# Patient Record
Sex: Female | Born: 1945 | Race: White | Hispanic: No | Marital: Married | Smoking: Never smoker
Health system: Southern US, Community
[De-identification: ages and names within clinical notes are randomized; demographics above are authoritative.]

## PROBLEM LIST (undated history)

## (undated) DIAGNOSIS — E785 Hyperlipidemia, unspecified: Secondary | ICD-10-CM

## (undated) DIAGNOSIS — I10 Essential (primary) hypertension: Secondary | ICD-10-CM

## (undated) DIAGNOSIS — K589 Irritable bowel syndrome without diarrhea: Secondary | ICD-10-CM

## (undated) DIAGNOSIS — T7840XA Allergy, unspecified, initial encounter: Secondary | ICD-10-CM

## (undated) DIAGNOSIS — R011 Cardiac murmur, unspecified: Secondary | ICD-10-CM

## (undated) HISTORY — DX: Hyperlipidemia, unspecified: E78.5

## (undated) HISTORY — DX: Essential (primary) hypertension: I10

## (undated) HISTORY — DX: Irritable bowel syndrome, unspecified: K58.9

## (undated) HISTORY — DX: Cardiac murmur, unspecified: R01.1

## (undated) HISTORY — PX: KNEE ARTHROSCOPY: SUR90

## (undated) HISTORY — PX: COLONOSCOPY: SHX174

## (undated) HISTORY — PX: ABDOMINAL HYSTERECTOMY: SHX81

## (undated) HISTORY — DX: Allergy, unspecified, initial encounter: T78.40XA

---

## 1983-04-01 HISTORY — PX: BIOPSY BREAST: PRO8

## 2002-09-22 ENCOUNTER — Encounter: Payer: Self-pay | Admitting: Obstetrics & Gynecology

## 2002-09-22 ENCOUNTER — Ambulatory Visit (HOSPITAL_COMMUNITY): Admission: RE | Admit: 2002-09-22 | Discharge: 2002-09-22 | Payer: Self-pay | Admitting: Obstetrics & Gynecology

## 2002-10-04 ENCOUNTER — Encounter: Payer: Self-pay | Admitting: Obstetrics & Gynecology

## 2002-10-04 ENCOUNTER — Ambulatory Visit (HOSPITAL_COMMUNITY): Admission: RE | Admit: 2002-10-04 | Discharge: 2002-10-04 | Payer: Self-pay | Admitting: Obstetrics & Gynecology

## 2003-11-22 ENCOUNTER — Ambulatory Visit (HOSPITAL_COMMUNITY): Admission: RE | Admit: 2003-11-22 | Discharge: 2003-11-22 | Payer: Self-pay | Admitting: Obstetrics & Gynecology

## 2004-01-31 ENCOUNTER — Ambulatory Visit (HOSPITAL_COMMUNITY): Admission: RE | Admit: 2004-01-31 | Discharge: 2004-01-31 | Payer: Self-pay | Admitting: Family Medicine

## 2004-03-18 ENCOUNTER — Ambulatory Visit (HOSPITAL_COMMUNITY): Admission: RE | Admit: 2004-03-18 | Discharge: 2004-03-18 | Payer: Self-pay | Admitting: Internal Medicine

## 2004-03-18 ENCOUNTER — Ambulatory Visit: Payer: Self-pay | Admitting: Internal Medicine

## 2006-03-09 ENCOUNTER — Ambulatory Visit (HOSPITAL_COMMUNITY): Admission: RE | Admit: 2006-03-09 | Discharge: 2006-03-09 | Payer: Self-pay | Admitting: Obstetrics & Gynecology

## 2006-10-05 ENCOUNTER — Inpatient Hospital Stay (HOSPITAL_COMMUNITY): Admission: RE | Admit: 2006-10-05 | Discharge: 2006-10-07 | Payer: Self-pay | Admitting: Obstetrics & Gynecology

## 2006-10-05 ENCOUNTER — Encounter: Payer: Self-pay | Admitting: Obstetrics & Gynecology

## 2007-12-01 ENCOUNTER — Ambulatory Visit: Payer: Self-pay | Admitting: Vascular Surgery

## 2008-06-05 ENCOUNTER — Ambulatory Visit: Payer: Self-pay | Admitting: Cardiology

## 2008-12-05 DIAGNOSIS — E785 Hyperlipidemia, unspecified: Secondary | ICD-10-CM | POA: Insufficient documentation

## 2008-12-05 DIAGNOSIS — R030 Elevated blood-pressure reading, without diagnosis of hypertension: Secondary | ICD-10-CM | POA: Insufficient documentation

## 2008-12-05 DIAGNOSIS — R079 Chest pain, unspecified: Secondary | ICD-10-CM | POA: Insufficient documentation

## 2008-12-05 DIAGNOSIS — J45909 Unspecified asthma, uncomplicated: Secondary | ICD-10-CM | POA: Insufficient documentation

## 2009-11-08 ENCOUNTER — Ambulatory Visit (HOSPITAL_COMMUNITY): Admission: RE | Admit: 2009-11-08 | Discharge: 2009-11-08 | Payer: Self-pay | Admitting: Family Medicine

## 2009-11-14 ENCOUNTER — Ambulatory Visit (HOSPITAL_COMMUNITY): Admission: RE | Admit: 2009-11-14 | Discharge: 2009-11-14 | Payer: Self-pay | Admitting: Family Medicine

## 2010-02-08 ENCOUNTER — Ambulatory Visit (HOSPITAL_COMMUNITY): Admission: RE | Admit: 2010-02-08 | Discharge: 2010-02-08 | Payer: Self-pay | Admitting: Obstetrics & Gynecology

## 2010-08-13 NOTE — Procedures (Signed)
DUPLEX DEEP VENOUS EXAM - LOWER EXTREMITY   INDICATION:  Bilateral lower extremity edema.   HISTORY:  Edema:  Two to 3 month bilateral lower extremity edema right  worse than left.  Trauma/Surgery:  No.  Pain:  Bilateral soreness in the legs.  PE:  No.  Previous DVT:  No.  Anticoagulants:  No.  Other:  No.   DUPLEX EXAM:                CFV   SFV   PopV  PTV    GSV                R  L  R  L  R  L  R   L  R  L  Thrombosis    o  o  o  o  o  o  o   o  o  o  Spontaneous   +  +  +  +  +  +  +   +  +  +  Phasic        +  +  +  +  +  +  +   +  +  +  Augmentation  +  +  +  +  +  +  +   +  +  +  Compressible  +  +  +  +  +  +  +   +  +  +  Competent     +  +  +  +  +  +  +   +  +  +   Legend:  + - yes  o - no  p - partial  D - decreased   IMPRESSION:  1. No evidence of deep vein thrombosis, superficial vein thrombus or      Baker's cyst bilaterally.  2. No evidence of deep venous incompetence bilaterally.    _____________________________  Jessy Oto Fields, MD   MC/MEDQ  D:  12/01/2007  T:  12/01/2007  Job:  [id number]

## 2010-08-13 NOTE — Letter (Signed)
June 05, 2008    Margaretmary Eddy, MD  [facility], [place] [address]   RE:  Tracie Brooks, Tracie Brooks  MRN:  [id number]  /  DOB:  [birth year]   Dear Richardson Landry:   It is my pleasure evaluating Ms. Schanz in the office today in  consultation at your request for hypertension and hyperlipidemia.  Unfortunately, we did not have records from your office prior to the  visit.  It sounds as if she has been generally healthy, but has been  found to have mild-to-moderate hypertension and some hyperlipidemia.  She has not tolerated multiple pharmacologic preparations, most notably  amlodipine due to excessive lower extremity swelling.  She developed leg  discomfort with pravastatin.  She had concerns about  hydrochlorothiazide, furosemide, and benazepril as well without a  compelling story for adverse reactions.  She reports that her mother was  allergic to [place] and was told that none of her children should  ever take those medications; nonetheless, she did tolerate  hydrochlorothiazide for a long period of time.   She was seen by Dr. Verl Blalock approximately 10 years ago for chest discomfort  and an abnormal stress nuclear study.  That abnormality was quite mild  prompting him to recommend continuing observation.  Her symptoms have  resolved.  She does not have any definite manifestations of vascular  disease.   Otherwise, she has been generally healthy.  She has had some upper  respiratory tract symptoms and has been diagnosed with asthma.  She uses  a Ventolin inhaler a few times per day for audible wheezing.  She  underwent TAHBSO for benign disease approximately 2 years ago.   She reports an adverse reaction to amlodipine.  She has no definite drug  allergies.   She currently takes red yeast rice, fish oil, a multivitamin, calcium,  vitamin D, and acidophilus.   SOCIAL HISTORY:  Works as a Designer, multimedia for [employer]; married  with no children; no use of tobacco products or  alcohol.   FAMILY HISTORY:  Positive for conduction system disease in her father  and hypertension in her mother; both are alive and doing fairly well.  She has four siblings, two of whom have hypertension.   Review of systems is generally negative.  She requires corrective  lenses.  She has occasional palpitations and has been told of a heart  murmur.  She has some arthritic discomfort and mild edema in the ankles  and feet.   PHYSICAL EXAMINATION:  GENERAL:  Pleasant overweight woman in no acute  distress.  VITAL SIGNS:  The weight is 219, 5 pounds more than 10 years ago.  Blood  pressure 150/80, heart rate 70 and regular, respirations 12 and  unlabored.  HEENT:  EOMs full; pupils equal, round, and react to light; normal oral  mucosa.  NECK:  No jugular venous distention; normal carotid upstrokes without  bruits.  ENDOCRINE:  No thyromegaly.  HEMATOPOIETIC:  No adenopathy.  LUNGS:  Clear.  CARDIAC:  Normal first and second heart sounds; minimal basilar systolic  ejection murmur.  ABDOMEN:  Soft and nontender; no bruits; no masses; no organomegaly.  EXTREMITIES:  No edema; distal pulses intact.  NEUROLOGIC:  Symmetric strength and tone; normal cranial nerves.   EKG:  Sinus rhythm; borderline left atrial abnormality; slightly delayed  R-wave progression; right ventricular conduction delay; otherwise  normal.   IMPRESSION:  Ms. Claycomb has mild hypertension without clinical  manifestations of vascular disease.  A number of medications cannot be  utilized, notably amlodipine due to her prior adverse reaction, thiazide  diuretic due to her concern about a possible allergic reaction and beta-  blockers due to her history of asthma.  I told her that if she continues  to have multiple episodes of asthma per day, therapy beyond a rescue  inhaler is warranted.  Since she has cold-induced symptoms, it might be  worthwhile to wait for warmer weather to make that decision.   There are  still quite a few options with respect to blood pressure  control.  An ACE inhibitor would be a good first choice.  Due to her  concern about adverse effects, I think it would be preferable to go one  drug at a time.  She will start lisinopril 20 mg daily and assess blood  pressures at home.  I will see her again in the office in 3 weeks.   The only lipid value I have is an LDL of a somewhat more than 150.  Ms.  Cadenas cardiovascular risk is low enough, such that guidelines would not  recommend pharmacologic therapy for this degree of hyperlipidemia.  I  did discuss the benefit of possible weight loss, but she notes that she  has not been successful with this in the past.  We can certainly have  her add Benecol margarine to her current nutraceutical regime and  reassess a lipid profile some weeks in the future.   Thanks much for sending this nice woman to see me.  I will be happy to  manage her cardiovascular risk factors.    Sincerely,      Cristopher Estimable. Lattie Haw, MD, [facility]  Electronically Signed    RMR/MedQ  DD: 06/05/2008  DT: 06/06/2008  Job #: [id number]

## 2010-08-13 NOTE — Discharge Summary (Signed)
Tracie Brooks, Tracie Brooks                   ACCOUNT NO.:  [id number]   MEDICAL RECORD NO.:  [id number]          PATIENT TYPE:  INP   LOCATION:  A312                          FACILITY:  [facility]   PHYSICIAN:  Jonnie Kind, M.D. DATE OF BIRTH:  09/16/1945   DATE OF ADMISSION:  10/05/2006  DATE OF DISCHARGE:  07/09/2008LH                               DISCHARGE SUMMARY   ADMISSION DIAGNOSIS:  Symptomatic fibroid uterus 20-week size.   DISCHARGE DIAGNOSES:  1. Symptomatic fibroid uterus 20-week size.  2. Endometrial polyp.  3. Uterine fibroids (leiomyomata, intramural and subserosal).   PROCEDURE:  Total abdominal hysterectomy, bilateral salpingo-  oophorectomy on October 05, 2006.   DISCHARGE MEDICATIONS:  1. Percocet 5/325 mg, #21, one to two p.o. q.6h. p.r.n. pain (new).  2. Motrin 800 mg, #20 tab, one q.8h. p.r.n. cramps or mild pain (new).  3. Phenergan 25 mg, #12, one p.o. q.6h. p.r.n. pain (new).  4. Continued medications with amlodipine/benazepril 1.5/10 mg, one      p.o. q.d.  5. Hydrochlorothiazide 25 mg, one p.o. q.d.  6. Multivitamin one p.o. q.d.  7. Calcium one daily.   HISTORY:  This 65 year old female, gravida 0, para 0, was admitted for a  four-year history of an enlarged fibroid uterus which was symptomatic.  It filled the pelvis and had a pressure mass effect as described in the  HPI, with the uterus 18-20 week size.  The Pap smear was normal.   HOSPITAL COURSE:  The patient was admitted and underwent a hysterectomy  by Dr. Florian Buff with an uncomplicated operative course.  A  preoperative hemoglobin was 12, hematocrit 38.  Postoperative hemoglobin  was 11 and hematocrit 32.3.  Potassium was 4, BUN 11, creatinine 0.84.   The postoperative course was straightforward with the patient's  pathology report revealing a 500 gram uterus with fibroids up to 8 cm in  diameter on the pathology sample.  There was an endometrial polyp that  was currently asymptomatic.   DISPOSITION:  The patient was discharged home in stable condition after  two days.   FOLLOWUP:  On October 12, 2006, with Dr. Elonda Husky.  Routine post-surgical  instructions were reviewed.      Jonnie Kind, M.D.  Electronically Signed     JVF/MEDQ  D:  10/07/2006  T:  10/07/2006  Job:  [id number]

## 2010-08-13 NOTE — H&P (Signed)
NAME:  Tracie Brooks, Tracie Brooks                   ACCOUNT NO.:  [id number]   MEDICAL RECORD NO.:  [id number]          PATIENT TYPE:  INP   LOCATION:  A312                          FACILITY:  [facility]   PHYSICIAN:  Florian Buff, M.D.   DATE OF BIRTH:  Feb 25, 1946   DATE OF ADMISSION:  10/05/2006  DATE OF DISCHARGE:  LH                              HISTORY & PHYSICAL   HISTORY OF PRESENT ILLNESS:  Barbaraann Share is a 65 year old white female,  gravida 0, para 0, about 3 years postmenopausal, who has a several-year  history, as far as I have seen her about a 4-year history of enlarged  fibroid uterus, which had been minimally to asymptomatic over that  period of time.  However, over the past 6 months or so the patient has  had increasing problems with pain, pressure, urinary frequency and  basically feels lower abdominal discomfort all the time.  Her uterus is  about the same size, about 18-20 weeks' size.  It fills the entire  pelvis and arises to the level of the umbilicus.  There is probably  degeneration as a cause for the pain.  She had an ultrasound a couple of  years ago, which revealed them to be fibroids, not an adnexal mass.  Again, she had periods pretty regular until about 24 or 64 years old.  As a result, she is admitted for abdominal hysterectomy and BSO.   PAST MEDICAL HISTORY:  Significant for hypertension.   PAST SURGICAL HISTORY:  A breast biopsy and a right knee arthroscopy.   MEDICATIONS:  Amlodipine and hydrochlorothiazide.  She also takes some  over-the-counter medications for allergies, dust allergies, etc.   Her only allergy is to SULFA DRUGS.   PAST OBSTETRICAL HISTORY:  She is nulliparous.   Her EKG was normal.  Chest x-ray was normal.   Review of systems otherwise negative.   HEENT is unremarkable.  Thyroid is normal.  Lungs are clear.  Heart is regular rhythm without murmur, rub, or gallop.  Breasts without masses, discharge or skin changes.  Abdomen is benign, no  hepatosplenomegaly or masses.  On pelvic, she has normal external genitalia.  Vagina is pink and moist  without discharge.  Her last Pap smear was normal.  Her uterus is  enlarged, 18-20 weeks' size, arising out of the pelvis to the level of  the umbilicus.  Adnexa are not discernible.  EXTREMITIES:  Warm, no edema.  Neurologic exam is grossly intact.   IMPRESSION:  1. Enlarged fibroid uterus.  2. Increasing pain and symptomatology.   PLAN:  The patient is admitted for abdominal hysterectomy.  She  understands the risks, benefits, indications and alternatives and will  proceed.      Florian Buff, M.D.  Electronically Signed     LHE/MEDQ  D:  10/06/2006  T:  10/06/2006  Job:  [id number]

## 2010-08-13 NOTE — Op Note (Signed)
NAMECHRISTON, Brooks                   ACCOUNT NO.:  [id number]   MEDICAL RECORD NO.:  [id number]          PATIENT TYPE:  INP   LOCATION:  A312                          FACILITY:  [facility]   PHYSICIAN:  Florian Buff, M.D.   DATE OF BIRTH:  January 24, 1946   DATE OF PROCEDURE:  10/05/2006  DATE OF DISCHARGE:                               OPERATIVE REPORT   PREOPERATIVE DIAGNOSES:  1. Enlarged fibroid uterus.  2. Pelvic pain.   POSTOPERATIVE DIAGNOSES:  1. Enlarged fibroid uterus.  2. Pelvic pain.   PROCEDURE:  TAH-BSO.   SURGEON:  Florian Buff, M.D.   ANESTHESIA:  General.   FINDINGS:  The patient had multiple fibroids on her uterus, most  prominently one on her fundus that was pedunculated as well as a large  one that was posterior on the posterior uterine wall.  The ovaries and  tubes were normal.  The cervix appeared to be normal.   DESCRIPTION OF OPERATION:  The patient was taken to the operating room  and placed in the supine position where she underwent general  endotracheal anesthesia.  The vagina was prepped in the usual sterile  fashion.  A Foley catheter was placed.  The abdomen was prepped and  draped in the usual sterile fashion.   A Pfannenstiel skin incision was made and carried down sharply to the  rectus fascia which was scored in midline and extended laterally.  The  fascia was taken off the muscle superiorly and inferiorly without  difficulty.  The muscles were divided, and the peritoneal cavity was  entered.  I tried to use an Alexis wound retractor during the procedure,  but that just did not give [place] enough retraction.  As a result, I used a  Engineer, maintenance.  The upper abdomen was packed away as  the patient was placed in Trendelenburg position.  The uterus was quite  enlarged.  It basically filled the pelvis from the pelvic brim, sidewall  to sidewall.  As a result, I had to sort of modify what I normally do.  I did identify the right round  ligament and did a suture ligation of  that and then cut it and developed the vesicouterine serosal flap on the  right as far as I could.  I then located the left ovary.  It was  adherent to the sigmoid, and I dissected it off without difficulty.  The  infundibulopelvic ligament was isolated.  It was clamped, cut, and  double suture ligated.  The left round ligament could then be seen.  It  was clamped, cut, and double suture ligated, and the vesicouterine  serosal flaps were created on this side as well.  I was then able to  sort of flip up the uterus because the posterior myoma had basically  inverted it posteriorly and could push the bladder down off the lower  uterine segment.  The uterine vessels were skeletonized.  The right  infundibulopelvic ligament was then isolated.  It was clamped, cut, and  double suture ligated.  The uterine vessels were clamped,  cut, and  suture ligated bilaterally, and the fundus of the uterus was removed.  We had good visualization at this point.  Serial pedicles were taken  down the cervix to the cardinal ligament, each pedicle being clamped,  cut, and suture ligated.  The vagina was crossclamped.  The cervix was  removed.  Vaginal angle sutures were placed, and the vagina was closed  with interrupted figure-of-eight sutures.  The pelvis was irrigated  vigorously.  All pedicles were made hemostatic.  Interceed was placed  over the vaginal cuff.  The Balfour self-retaining retractor was  removed.  The packs were removed.  The muscles and peritoneum were  reapproximated loosely.  The fascia was closed using 0 Vicryl running.  The subcutaneous tissue was made hemostatic and irrigated.  Subcuticular  sutures were placed for reapproximation of the subcutaneous tissue, and  the incision was closed with a 3-0 Vicryl on a Keith needle in a  subcuticular fashion without difficulty.  Dermabond was then placed for  additional skin reapproximation and as a sterile  barrier.   The patient tolerated the procedure well.  She experienced 250 mL of  blood loss and was taken to the recovery room in good and stable  condition.  All counts were correct x3.      Florian Buff, M.D.  Electronically Signed     LHE/MEDQ  D:  10/05/2006  T:  10/05/2006  Job:  [id number]

## 2010-08-13 NOTE — Assessment & Plan Note (Signed)
OFFICE VISIT   TEASIA, ZAPF  DOB:  [birth year]                                       12/01/2007  PNTIR#:[id number]   HISTORY:  This is a 65 year old female referred for chronic leg  swelling.  This has been present for approximately 3 months.  Right leg  is worse than left.  She has a history of chronic ankle swelling that  has been present for several years but has slowly gotten worse.  She  denies any history of DVT or trauma.  She states that her edema is  improved with sitting down and elevating the legs.   PAST SURGICAL HISTORY:  Remarkable for hysterectomy and a right knee  arthroscopy.   PAST MEDICAL HISTORY:  Significant for hypertension and elevated  cholesterol.   FAMILY HISTORY:  Unremarkable.   SOCIAL HISTORY:  She is married and has 1 child.  Works as an Solicitor.  She is a nonsmoker, non-consumer of alcohol.   REVIEW OF SYSTEMS:  She is 5 feet 6 inches, 220 pounds.  Cardiac:  She  has an occasional chest tightness and pressure.  Pulmonary, GI, renal,  vascular, neurologic, orthopedic psychiatric, ENT and hematologic review  of systems are otherwise negative.   MEDICATIONS:  Furosemide 20 mg once a day, benazepril 20 mg once a day,  pravastatin 20 mg once a day.   She is has listed allergy to sulfa although she has never taken the drug  before.  She apparently has a family history of Stevens-Johnson syndrome  with this.   PHYSICAL EXAM:  Blood pressure 144/77 in the left arm, heart rate 67 and  regular.  HEENT:  Unremarkable.  She has 2+ carotid pulses without  bruit.  Chest:  Clear to auscultation.  Cardiac exam is regular rate and  rhythm.  Abdomen is soft, nontender, nondistended, obese, no masses.  She has 2+ femoral, dorsalis pedis and posterior tibial pulses  bilaterally.  She has a 2 cm ulceration on the tip of the left first toe  which she states was from a recent trauma.  She does have pitting edema  in the right  leg extending from the knee down to the foot.  The skin is  also slightly warmer in the right calf and leg compared to the left.  There is no pitting of the left leg.   She had a venous duplex exam today which showed no evidence of deep or  superficial venous thrombus.  She did have bilateral Baker's cysts.  She  had no evidence of venous reflux bilaterally.  Veins were competent  bilaterally.   The patient has chronic leg swelling which has worsened in the right leg  recently.  She has no evidence of DVT or evidence of venous incompetence  in the right leg.  I believe the best management currently would be to  place her in compression stockings for symptomatic relief.  If she does  not get significant improvement with this, we could consider doing a CT  scan of the abdomen and pelvis to make sure that she has no venous  compression of her iliac venous system.  However, I did counsel her  today that the likelihood of this would be fairly low.  She will follow  up on an as-needed basis if she warrants further evaluation of this.  Jessy Oto. Fields, MD  Electronically Signed   CEF/MEDQ  D:  12/02/2007  T:  12/02/2007  Job:  [id number]   cc:   Margaretmary Eddy, M.D.

## 2010-08-16 NOTE — Op Note (Signed)
[name], [name]                   ACCOUNT NO.:  [id number]   MEDICAL RECORD NO.:  [id number]          PATIENT TYPE:  AMB   LOCATION:  DAY                           FACILITY:  [facility]   PHYSICIAN:  Hildred Laser, M.D.    DATE OF BIRTH:  06/19/1945   DATE OF PROCEDURE:  03/18/2004  DATE OF DISCHARGE:                                 OPERATIVE REPORT   PROCEDURE:  Total colonoscopy.   INDICATIONS:  Tracie Brooks is a 65 year old Caucasian female who is here for  screening colonoscopy.  Family history negative for colorectal carcinoma.  The procedure risks were reviewed with the patient and informed consent was  obtained.   PREMEDICATION:  Demerol 50 mg IV, Versed 6 mg IV in divided dose.   FINDINGS:  Procedure performed in endoscopy suite.  The patient's vital  signs and O2 saturation were monitored during procedure and remained stable.  The patient was placed in the left lateral recumbent position and rectal  examination performed.  No abnormality noted on external or digital exam.  The Olympus video scope was placed in the rectum and advanced under vision  into sigmoid colon and beyond.  Preparation was satisfactory.  She had some  formed stool in her sigmoid colon.  It seemed to be not very easily shifted.  There were scattered diverticula at sigmoid colon.  The scope was advanced  to the cecum, which was identified by appendiceal orifice and ileocecal  valve.  Pictures taken for the record.  As the scope was withdrawn, colonic  mucosa was examined for the second time and there were no polyps or tumor  masses.  Rectal mucosa was normal.  The scope was retroflexed to examine the  anorectal junction, and small hemorrhoids were noted below the dentate line.  The endoscope was straightened and withdrawn.  The patient tolerated the  procedure well.   FINAL DIAGNOSES:  1.  Scattered diverticula at sigmoid colon.  2.  Small external hemorrhoids.  3.  Otherwise normal examination.   RECOMMENDATIONS:  1.  A high-fiber diet.  2.  Citrucel one tablespoonful daily.  3.  She should continue yearly Hemoccults and consider having next screening      exam in 10 years from now.     Naje   NR/MEDQ  D:  03/18/2004  T:  03/18/2004  Job:  [id number]   cc:   Margaretmary Eddy, M.D.  [address] [place] [address]  Fax: [phone]

## 2011-01-14 LAB — DIFFERENTIAL
Basophils Absolute: 0
Basophils Absolute: 0
Basophils Absolute: 0
Basophils Relative: 0
Basophils Relative: 0
Basophils Relative: 0
Eosinophils Absolute: 0.1
Eosinophils Absolute: 0.4
Eosinophils Absolute: 0.6
Eosinophils Relative: 1
Eosinophils Relative: 4
Eosinophils Relative: 7 — ABNORMAL HIGH
Lymphocytes Relative: 10 — ABNORMAL LOW
Lymphocytes Relative: 11 — ABNORMAL LOW
Lymphocytes Relative: 16
Lymphs Abs: 1
Lymphs Abs: 1.3
Lymphs Abs: 1.4
Monocytes Absolute: 0.5
Monocytes Absolute: 0.9 — ABNORMAL HIGH
Monocytes Absolute: 0.9 — ABNORMAL HIGH
Monocytes Relative: 6
Monocytes Relative: 7
Monocytes Relative: 9
Neutro Abs: 10.5 — ABNORMAL HIGH
Neutro Abs: 5.6
Neutro Abs: 7.8 — ABNORMAL HIGH
Neutrophils Relative %: 71
Neutrophils Relative %: 76
Neutrophils Relative %: 81 — ABNORMAL HIGH

## 2011-01-14 LAB — CBC
HCT: 31 — ABNORMAL LOW
HCT: 32.3 — ABNORMAL LOW
HCT: 38.4
Hemoglobin: 10.6 — ABNORMAL LOW
Hemoglobin: 11 — ABNORMAL LOW
Hemoglobin: 12.9
MCHC: 33.7
MCHC: 34
MCHC: 34.2
MCV: 89.4
MCV: 90
MCV: 90.4
Platelets: 257
Platelets: 258
Platelets: 285
RBC: 3.47 — ABNORMAL LOW
RBC: 3.57 — ABNORMAL LOW
RBC: 4.26
RDW: 12.9
RDW: 13.1
RDW: 13.2
WBC: 10.2
WBC: 12.9 — ABNORMAL HIGH
WBC: 8

## 2011-01-14 LAB — URINALYSIS, ROUTINE W REFLEX MICROSCOPIC
Bilirubin Urine: NEGATIVE
Glucose, UA: NEGATIVE
Hgb urine dipstick: NEGATIVE
Ketones, ur: NEGATIVE
Nitrite: NEGATIVE
Protein, ur: NEGATIVE
Specific Gravity, Urine: 1.01
Urobilinogen, UA: 0.2
pH: 6

## 2011-01-14 LAB — URINE MICROSCOPIC-ADD ON

## 2011-01-14 LAB — COMPREHENSIVE METABOLIC PANEL
ALT: 34
AST: 25
Albumin: 4
Alkaline Phosphatase: 146 — ABNORMAL HIGH
BUN: 11
CO2: 31
Calcium: 9.5
Chloride: 101
Creatinine, Ser: 0.84
GFR calc Af Amer: >60
GFR calc non Af Amer: >60
Glucose, Bld: 131 — ABNORMAL HIGH
Potassium: 4
Sodium: 137
Total Bilirubin: 0.6
Total Protein: 7

## 2011-01-14 LAB — TYPE AND SCREEN
ABO/RH(D): A POS
Antibody Screen: NEGATIVE

## 2011-03-26 ENCOUNTER — Encounter: Payer: Self-pay | Admitting: Cardiology

## 2011-05-12 ENCOUNTER — Ambulatory Visit: Payer: Self-pay | Admitting: Internal Medicine

## 2011-05-15 ENCOUNTER — Encounter: Payer: Self-pay | Admitting: Internal Medicine

## 2011-06-26 ENCOUNTER — Encounter (INDEPENDENT_AMBULATORY_CARE_PROVIDER_SITE_OTHER): Payer: Self-pay | Admitting: *Deleted

## 2011-07-15 ENCOUNTER — Ambulatory Visit (INDEPENDENT_AMBULATORY_CARE_PROVIDER_SITE_OTHER): Payer: Medicare Other | Admitting: Internal Medicine

## 2011-07-15 ENCOUNTER — Encounter (INDEPENDENT_AMBULATORY_CARE_PROVIDER_SITE_OTHER): Payer: Self-pay | Admitting: Internal Medicine

## 2011-07-15 VITALS — BP 128/70 | HR 72 | Temp 97.9°F | Ht 66.0 in | Wt 211.8 lb

## 2011-07-15 DIAGNOSIS — R7401 Elevation of levels of liver transaminase levels: Secondary | ICD-10-CM

## 2011-07-15 DIAGNOSIS — I69359 Hemiplegia and hemiparesis following cerebral infarction affecting unspecified side: Secondary | ICD-10-CM | POA: Insufficient documentation

## 2011-07-15 DIAGNOSIS — R748 Abnormal levels of other serum enzymes: Secondary | ICD-10-CM

## 2011-07-15 DIAGNOSIS — R350 Frequency of micturition: Secondary | ICD-10-CM

## 2011-07-15 NOTE — Progress Notes (Addendum)
Subjective:     Patient ID: Tracie Brooks, female   DOB: February 03, 1946, 66 y.o.   MRN: [id number]  HPI Referred by Dr. Mickie Hillier for elevated liver enzymes.  She tells me in the past her liver enzymes have been elevated.  She had not been on any cholesterol medications. Appetite is good. No unintentional weight loss. No abdominal pain.  She has a BM 1-3 times a day. She uderwent an US abdomne 11/08/2009 for elevated LFTs. (Please see below). No tattoos. NO IV drug use.  10/01/06 ALP 146, AST 25, ALT 34 8.10/2009 ALP 144, AST  62, ALT 69 06/16/2011 ALP 147, AST 40 ALT 62, total bili 0.4, direct bili 0.1, Indirect bili 0.4,  11/05/2009 Ferritin: 70 Iron 72,  TIBC %Sat 21  11/08/2009 US abdomen Elevated LFTs: Other: No free fluid  IMPRESSION:  2.5 x 1.8 x 2.4 cm diameter slightly hyperechoic mass at inferior  pole right kidney, indeterminate etiology; differential diagnosis  includes angiomyolipoma, renal cell carcinoma, or oncocytoma.  Recommend characterization of the this lesion by MR imaging with  and without contrast to exclude renal malignancy.  Incomplete pancreatic visualization.  No focal hepatic biliary abnormalities.  11/14/2009 MR abdomen w/without  IMPRESSION:  1. 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  2. Small bilateral renal cysts.  Provider: Mauri Pole, Juanda Chance 3. Liver: Normal appearance  Review of Systems see hpi Current Outpatient Prescriptions  Medication Sig Dispense Refill  . acetaminophen (TYLENOL) 325 MG tablet Take 650 mg by mouth every 6 (six) hours as needed.      . calcium & magnesium carbonates (MYLANTA) 311-232 MG per tablet Take 1 tablet by mouth daily.      . Grape Seed 100 MG CAPS Take by mouth.      . hydrochlorothiazide (HYDRODIURIL) 25 MG tablet Take 25 mg by mouth daily.      . Lactobacillus (ACIDOPHILUS) 10 MG CAPS Take by mouth.      . Red  Yeast Rice 600 MG CAPS Take by mouth.      . Vitamin D, Ergocalciferol, (DRISDOL) 50000 UNITS CAPS Take 50,000 Units by mouth.       Past Medical History  Diagnosis Date  . Hypertension   . Asthma   . Dyslipidemia   . Chest pain    Past Surgical History  Procedure Date  . Abdominal hysterectomy   . Colonoscopy   . Knee arthroscopy     right 1982   Family Status  Relation Status Death Age  . Mother Deceased     gave up  . Father Stage manager  . Sister Alive     one has CAD, HTN,DM. One has breast cancer and skin cancer  . Brother Alive     fair health   History   Social History  . Marital Status: Married    Spouse Name: N/A    Number of Children: N/A  . Years of Education: N/A   Occupational History  . Retired    Social History Main Topics  . Smoking status: Never Smoker   . Smokeless tobacco: Not on file  . Alcohol Use: No  . Drug Use: No  . Sexually Active: Not on file   Other Topics Concern  . Not on file   Social History Narrative   MarriedNo regular exercise   Allergies  Allergen Reactions  . Sulfa Antibiotics  Objective:   Physical Exam Filed Vitals:   07/15/11 1126  Height: 5' 6"  (1.676 m)  Weight: 211 lb 12.8 oz (96.072 kg)   Alert and oriented. Skin warm and dry. Oral mucosa is moist.   . Sclera anicteric, conjunctivae is pink. Thyroid not enlarged. No cervical lymphadenopathy. Lungs clear. Heart regular rate and rhythm.  Abdomen is soft. Bowel sounds are positive. No hepatomegaly. No abdominal masses felt. Very slight tenderness rt lower quadrant.  No edema to lower extremities. Patient is alert and oriented.      Assessment:    Elevated liver enzymes. Urinary frequency. Auto immune process needs to be ruled out     Plan:    US abdomen, Cmet. If abnormal will obtain Ferritin,cereuplasmin, ANA, SMA, Alpha 1 antitrypsin, Hep B and C markers.Will discuss with Dr. Laural Golden. Diet and exercise.

## 2011-07-15 NOTE — Patient Instructions (Signed)
CMET and US abdomen . OV in 3 months.

## 2011-07-16 ENCOUNTER — Encounter (INDEPENDENT_AMBULATORY_CARE_PROVIDER_SITE_OTHER): Payer: Self-pay

## 2011-07-16 ENCOUNTER — Telehealth (INDEPENDENT_AMBULATORY_CARE_PROVIDER_SITE_OTHER): Payer: Self-pay | Admitting: Internal Medicine

## 2011-07-16 DIAGNOSIS — R748 Abnormal levels of other serum enzymes: Secondary | ICD-10-CM

## 2011-07-16 LAB — COMPREHENSIVE METABOLIC PANEL
ALT: 54 U/L — ABNORMAL HIGH (ref 0–35)
AST: 38 U/L — ABNORMAL HIGH (ref 0–37)
Albumin: 4.2 g/dL (ref 3.5–5.2)
Alkaline Phosphatase: 142 U/L — ABNORMAL HIGH (ref 39–117)
BUN: 15 mg/dL (ref 6–23)
CO2: 29 mEq/L (ref 19–32)
Calcium: 9.6 mg/dL (ref 8.4–10.5)
Chloride: 103 mEq/L (ref 96–112)
Creat: 0.9 mg/dL (ref 0.50–1.10)
Glucose, Bld: 103 mg/dL — ABNORMAL HIGH (ref 70–99)
Potassium: 3.9 mEq/L (ref 3.5–5.3)
Sodium: 141 mEq/L (ref 135–145)
Total Bilirubin: 0.4 mg/dL (ref 0.3–1.2)
Total Protein: 6.6 g/dL (ref 6.0–8.3)

## 2011-07-16 LAB — URINALYSIS
Bilirubin Urine: NEGATIVE
Glucose, UA: NEGATIVE mg/dL
Hgb urine dipstick: NEGATIVE
Ketones, ur: NEGATIVE mg/dL
Leukocytes, UA: NEGATIVE
Nitrite: NEGATIVE
Protein, ur: NEGATIVE mg/dL
Specific Gravity, Urine: 1.017 (ref 1.005–1.030)
Urobilinogen, UA: 0.2 mg/dL (ref 0.0–1.0)
pH: 6 (ref 5.0–8.0)

## 2011-07-16 NOTE — Telephone Encounter (Signed)
Results of lab given to patient. Work up for elevated transaminases.     Butch Penny, she needs an OV in 3 months.

## 2011-07-18 ENCOUNTER — Other Ambulatory Visit (INDEPENDENT_AMBULATORY_CARE_PROVIDER_SITE_OTHER): Payer: Self-pay | Admitting: Internal Medicine

## 2011-07-18 ENCOUNTER — Ambulatory Visit (HOSPITAL_COMMUNITY)
Admission: RE | Admit: 2011-07-18 | Discharge: 2011-07-18 | Disposition: A | Discharge status: Home / Self Care | Payer: Medicare Other | Source: Ambulatory Visit | Attending: Internal Medicine | Admitting: Internal Medicine

## 2011-07-18 DIAGNOSIS — R748 Abnormal levels of other serum enzymes: Secondary | ICD-10-CM

## 2011-07-18 DIAGNOSIS — D3000 Benign neoplasm of unspecified kidney: Secondary | ICD-10-CM | POA: Insufficient documentation

## 2011-07-18 DIAGNOSIS — I10 Essential (primary) hypertension: Secondary | ICD-10-CM | POA: Insufficient documentation

## 2011-07-19 LAB — HEPATITIS C ANTIBODY: HCV Ab: NEGATIVE

## 2011-07-19 LAB — CERULOPLASMIN: Ceruloplasmin: 35 mg/dL (ref 20–60)

## 2011-07-19 LAB — HEPATITIS B SURFACE ANTIGEN: Hepatitis B Surface Ag: NEGATIVE

## 2011-07-19 LAB — ALPHA-1-ANTITRYPSIN: A-1 Antitrypsin, Ser: 164 mg/dL (ref 90–200)

## 2011-07-21 LAB — ANA: Anti Nuclear Antibody(ANA): POSITIVE — AB

## 2011-07-21 LAB — ANTI-SMOOTH MUSCLE ANTIBODY, IGG: Smooth Muscle Ab: 9 U (ref <20)

## 2011-07-21 LAB — ANTI-NUCLEAR AB-TITER (ANA TITER)

## 2011-07-21 NOTE — Telephone Encounter (Signed)
Labs are pending

## 2011-07-25 ENCOUNTER — Telehealth (INDEPENDENT_AMBULATORY_CARE_PROVIDER_SITE_OTHER): Payer: Self-pay | Admitting: Internal Medicine

## 2011-07-25 NOTE — Telephone Encounter (Signed)
Discussed this case with Dr. Laural Golden. OV 3 months with a C met and sed rate. Will treat as a fatty liver.     Tammy Todd,  CMET and sed rate in 3 months.   Butch Penny, OV in 3 months.

## 2011-07-28 ENCOUNTER — Telehealth (INDEPENDENT_AMBULATORY_CARE_PROVIDER_SITE_OTHER): Payer: Self-pay | Admitting: *Deleted

## 2011-07-28 DIAGNOSIS — R7401 Elevation of levels of liver transaminase levels: Secondary | ICD-10-CM

## 2011-07-28 NOTE — Telephone Encounter (Signed)
Per Lelon Perla the patient will need to have labs in 3 months with a office visit. Labs are noted for July 29th, letter will be sent to the patient as a reminder.

## 2011-07-28 NOTE — Telephone Encounter (Signed)
Labs are noted for July 29th Patient will need office visit after labs per Lelon Perla

## 2011-07-30 NOTE — Telephone Encounter (Signed)
Apt has been scheduled for 10/15/11 at 2:15 pm with Deberah Castle, NP

## 2011-08-22 ENCOUNTER — Other Ambulatory Visit: Payer: Self-pay | Admitting: Obstetrics & Gynecology

## 2011-08-22 DIAGNOSIS — Z139 Encounter for screening, unspecified: Secondary | ICD-10-CM

## 2011-08-26 ENCOUNTER — Ambulatory Visit (HOSPITAL_COMMUNITY)
Admission: RE | Admit: 2011-08-26 | Discharge: 2011-08-26 | Disposition: A | Discharge status: Home / Self Care | Payer: Medicare Other | Source: Ambulatory Visit | Attending: Obstetrics & Gynecology | Admitting: Obstetrics & Gynecology

## 2011-08-26 DIAGNOSIS — Z1231 Encounter for screening mammogram for malignant neoplasm of breast: Secondary | ICD-10-CM | POA: Insufficient documentation

## 2011-08-26 DIAGNOSIS — N63 Unspecified lump in unspecified breast: Secondary | ICD-10-CM | POA: Insufficient documentation

## 2011-08-26 DIAGNOSIS — Z139 Encounter for screening, unspecified: Secondary | ICD-10-CM

## 2011-09-02 ENCOUNTER — Other Ambulatory Visit: Payer: Self-pay | Admitting: Obstetrics & Gynecology

## 2011-09-02 DIAGNOSIS — R928 Other abnormal and inconclusive findings on diagnostic imaging of breast: Secondary | ICD-10-CM

## 2011-09-17 ENCOUNTER — Other Ambulatory Visit (HOSPITAL_COMMUNITY): Payer: Self-pay | Admitting: Obstetrics & Gynecology

## 2011-09-17 ENCOUNTER — Ambulatory Visit (HOSPITAL_COMMUNITY)
Admission: RE | Admit: 2011-09-17 | Discharge: 2011-09-17 | Disposition: A | Discharge status: Home / Self Care | Payer: Medicare Other | Source: Ambulatory Visit | Attending: Obstetrics & Gynecology | Admitting: Obstetrics & Gynecology

## 2011-09-17 ENCOUNTER — Ambulatory Visit (HOSPITAL_COMMUNITY): Payer: Medicare Other

## 2011-09-17 DIAGNOSIS — R928 Other abnormal and inconclusive findings on diagnostic imaging of breast: Secondary | ICD-10-CM | POA: Insufficient documentation

## 2011-09-25 ENCOUNTER — Encounter (INDEPENDENT_AMBULATORY_CARE_PROVIDER_SITE_OTHER): Payer: Self-pay | Admitting: *Deleted

## 2011-09-25 ENCOUNTER — Other Ambulatory Visit (INDEPENDENT_AMBULATORY_CARE_PROVIDER_SITE_OTHER): Payer: Self-pay | Admitting: *Deleted

## 2011-09-25 DIAGNOSIS — R7401 Elevation of levels of liver transaminase levels: Secondary | ICD-10-CM

## 2011-10-15 ENCOUNTER — Ambulatory Visit (INDEPENDENT_AMBULATORY_CARE_PROVIDER_SITE_OTHER): Payer: Medicare Other | Admitting: Internal Medicine

## 2012-04-23 ENCOUNTER — Other Ambulatory Visit: Payer: Self-pay | Admitting: Obstetrics & Gynecology

## 2012-04-23 DIAGNOSIS — Z09 Encounter for follow-up examination after completed treatment for conditions other than malignant neoplasm: Secondary | ICD-10-CM

## 2012-04-23 DIAGNOSIS — Z139 Encounter for screening, unspecified: Secondary | ICD-10-CM

## 2012-04-28 ENCOUNTER — Ambulatory Visit (HOSPITAL_COMMUNITY)
Admission: RE | Admit: 2012-04-28 | Discharge: 2012-04-28 | Disposition: A | Discharge status: Home / Self Care | Payer: Medicare Other | Source: Ambulatory Visit | Attending: Obstetrics & Gynecology | Admitting: Obstetrics & Gynecology

## 2012-04-28 DIAGNOSIS — N6489 Other specified disorders of breast: Secondary | ICD-10-CM | POA: Insufficient documentation

## 2012-04-28 DIAGNOSIS — Z09 Encounter for follow-up examination after completed treatment for conditions other than malignant neoplasm: Secondary | ICD-10-CM

## 2012-06-24 ENCOUNTER — Ambulatory Visit (INDEPENDENT_AMBULATORY_CARE_PROVIDER_SITE_OTHER): Payer: Medicare Other | Admitting: Family Medicine

## 2012-06-24 ENCOUNTER — Encounter: Payer: Self-pay | Admitting: Family Medicine

## 2012-06-24 VITALS — BP 130/88 | HR 60 | Ht 66.0 in | Wt 203.0 lb

## 2012-06-24 DIAGNOSIS — I10 Essential (primary) hypertension: Secondary | ICD-10-CM

## 2012-06-24 DIAGNOSIS — M129 Arthropathy, unspecified: Secondary | ICD-10-CM

## 2012-06-24 DIAGNOSIS — M1990 Unspecified osteoarthritis, unspecified site: Secondary | ICD-10-CM | POA: Insufficient documentation

## 2012-06-24 DIAGNOSIS — E785 Hyperlipidemia, unspecified: Secondary | ICD-10-CM

## 2012-06-24 MED ORDER — HYDROCHLOROTHIAZIDE 25 MG PO TABS: 25.0000 mg | ORAL_TABLET | Freq: Every day | ORAL | Status: DC

## 2012-06-24 NOTE — Patient Instructions (Signed)
Take two aleave twice per day for flare of arthritis pain

## 2012-06-24 NOTE — Progress Notes (Signed)
  Subjective:    Patient ID: Tracie Brooks, female    DOB: 1945/06/06, 67 y.o.   MRN: [id number]  Hyperlipidemia The current episode started more than 1 year ago. Recent lipid tests were reviewed and are high. She has no history of liver disease. There are no known factors aggravating her hyperlipidemia. Current antihyperlipidemic treatment includes herbal therapy. The current treatment provides no improvement of lipids. There are no compliance problems.   Hypertension This is a chronic problem. The current episode started more than 1 year ago. The problem is unchanged. The problem is controlled. Associated symptoms include peripheral edema. Pertinent negatives include no headaches. There are no associated agents to hypertension. Past treatments include diuretics. There are no compliance problems.   joint pain. Not exercing. smoothy commonly.   Review of Systems  Neurological: Negative for headaches.   positive for hand pain left side. Concerned because a family history of arthritis. Recalls no injury.     Objective:   Physical Exam  Alert HEENT normal. Vitals noted. Blood pressure repeat 142/90. Lungs clear. Heart regular in rhythm. Left hand no Heberden's nodes. Slight congenital deviation mid finger with tenderness at joint. Knees distinct crepitations     Assessment & Plan:  Impression #1 hyperlipidemia status uncertain. #2 hypertension fair control not awesome. Diet and exercise alone. #3 asthma clinically stable. #4 arthritis. Plan appropriate blood work. Diet exercise discussed in encourage. Aleve 2 tablets twice a day when necessary. Further recommendations based on blood work check every 6 months. Easily 25 minutes spent most in discussion

## 2012-07-10 LAB — BASIC METABOLIC PANEL
BUN: 18 mg/dL (ref 6–23)
CO2: 30 mEq/L (ref 19–32)
Calcium: 10.3 mg/dL (ref 8.4–10.5)
Creat: 0.91 mg/dL (ref 0.50–1.10)
Glucose, Bld: 94 mg/dL (ref 70–99)

## 2012-07-10 LAB — HEPATIC FUNCTION PANEL
ALT: 75 U/L — ABNORMAL HIGH (ref 0–35)
AST: 54 U/L — ABNORMAL HIGH (ref 0–37)
Bilirubin, Direct: 0.1 mg/dL (ref 0.0–0.3)

## 2012-07-10 LAB — LIPID PANEL
Cholesterol: 191 mg/dL (ref 0–200)
LDL Cholesterol: 125 mg/dL — ABNORMAL HIGH (ref 0–99)
VLDL: 30 mg/dL (ref 0–40)

## 2012-09-25 ENCOUNTER — Other Ambulatory Visit (HOSPITAL_COMMUNITY): Payer: Self-pay | Admitting: Obstetrics & Gynecology

## 2012-10-20 ENCOUNTER — Other Ambulatory Visit: Payer: Self-pay | Admitting: Obstetrics & Gynecology

## 2012-10-20 DIAGNOSIS — R928 Other abnormal and inconclusive findings on diagnostic imaging of breast: Secondary | ICD-10-CM

## 2012-10-20 DIAGNOSIS — Z139 Encounter for screening, unspecified: Secondary | ICD-10-CM

## 2012-10-27 ENCOUNTER — Ambulatory Visit (HOSPITAL_COMMUNITY)
Admission: RE | Admit: 2012-10-27 | Discharge: 2012-10-27 | Disposition: A | Discharge status: Home / Self Care | Payer: Medicare Other | Source: Ambulatory Visit | Attending: Obstetrics & Gynecology | Admitting: Obstetrics & Gynecology

## 2012-10-27 DIAGNOSIS — Z09 Encounter for follow-up examination after completed treatment for conditions other than malignant neoplasm: Secondary | ICD-10-CM | POA: Insufficient documentation

## 2012-10-27 DIAGNOSIS — N6489 Other specified disorders of breast: Secondary | ICD-10-CM | POA: Insufficient documentation

## 2012-10-27 DIAGNOSIS — R928 Other abnormal and inconclusive findings on diagnostic imaging of breast: Secondary | ICD-10-CM

## 2013-01-19 ENCOUNTER — Other Ambulatory Visit: Payer: Self-pay | Admitting: Family Medicine

## 2013-02-04 ENCOUNTER — Ambulatory Visit (INDEPENDENT_AMBULATORY_CARE_PROVIDER_SITE_OTHER): Payer: Medicare Other | Admitting: Family Medicine

## 2013-02-04 ENCOUNTER — Encounter: Payer: Self-pay | Admitting: Family Medicine

## 2013-02-04 VITALS — BP 136/74 | Temp 98.3°F | Ht 66.0 in | Wt 217.4 lb

## 2013-02-04 DIAGNOSIS — J329 Chronic sinusitis, unspecified: Secondary | ICD-10-CM

## 2013-02-04 MED ORDER — AMOXICILLIN-POT CLAVULANATE 875-125 MG PO TABS: 1.0000 | ORAL_TABLET | Freq: Two times a day (BID) | ORAL | Status: AC

## 2013-02-04 NOTE — Progress Notes (Signed)
  Subjective:    Patient ID: Tracie Brooks, female    DOB: 04/12/1945, 67 y.o.   MRN: [id number]  Cough This is a new problem. The current episode started 1 to 4 weeks ago. The problem has been gradually worsening. The cough is non-productive. Associated symptoms include ear congestion, nasal congestion, rhinorrhea and wheezing. The symptoms are aggravated by cold air, dust and pollens. She has tried steroid inhaler and OTC cough suppressant for the symptoms. The treatment provided mild relief. Her past medical history is significant for asthma, bronchitis and environmental allergies.   Two wks ago kicked in  Loving, was going away.  Feels congestion and tightness, upper resp congestion.  Sounds like a seal, Inhaler two to three times per d   Review of Systems  HENT: Positive for rhinorrhea.   Respiratory: Positive for cough and wheezing.   Allergic/Immunologic: Positive for environmental allergies.       Objective:   Physical Exam  Alert no acute distress vitals stable. HEENT moderate nasal congestion frontal tenderness. Worse for this. Barky cough at times.      Assessment & Plan:  Impression rhinosinusitis with croup element. Plan Augmentin twice a day 10 days. Symptomatic care discussed. WSL

## 2013-02-10 ENCOUNTER — Telehealth: Payer: Self-pay | Admitting: Family Medicine

## 2013-02-10 MED ORDER — BENZONATATE 100 MG PO CAPS: 100.0000 mg | ORAL_CAPSULE | Freq: Three times a day (TID) | ORAL | Status: DC | PRN

## 2013-02-10 MED ORDER — LORATADINE 10 MG PO TABS: 10.0000 mg | ORAL_TABLET | Freq: Every day | ORAL | Status: DC

## 2013-02-10 NOTE — Telephone Encounter (Signed)
Rxs sent electronically to [facility].  Patient notified.

## 2013-02-10 NOTE — Telephone Encounter (Signed)
Sorry i was assuming that was cough medicine. Add tess perles 100 mg twenty four one q 6 hrs prn cough. claritin 10 one qhs prn allergies 11 ref

## 2013-02-10 NOTE — Telephone Encounter (Signed)
Patient says cough has gotten worse and medication that was called in Friday is not helping. Patient is requesting a cough medicine and something called in for allergies. [facility]

## 2013-02-10 NOTE — Telephone Encounter (Signed)
Augmentin 875 mg

## 2013-02-10 NOTE — Telephone Encounter (Signed)
i see no record of what was called in fri. Let me know. Once u find out

## 2013-02-20 ENCOUNTER — Other Ambulatory Visit: Payer: Self-pay | Admitting: Family Medicine

## 2013-08-15 ENCOUNTER — Other Ambulatory Visit: Payer: Self-pay | Admitting: Family Medicine

## 2013-09-23 ENCOUNTER — Other Ambulatory Visit: Payer: Self-pay | Admitting: Family Medicine

## 2013-10-25 ENCOUNTER — Other Ambulatory Visit: Payer: Self-pay | Admitting: Family Medicine

## 2013-12-18 ENCOUNTER — Other Ambulatory Visit: Payer: Self-pay | Admitting: Family Medicine

## 2013-12-22 ENCOUNTER — Other Ambulatory Visit: Payer: Self-pay | Admitting: Family Medicine

## 2014-02-20 ENCOUNTER — Other Ambulatory Visit: Payer: Self-pay | Admitting: Family Medicine

## 2014-02-24 ENCOUNTER — Other Ambulatory Visit: Payer: Self-pay | Admitting: *Deleted

## 2014-02-24 MED ORDER — ALBUTEROL SULFATE HFA 108 (90 BASE) MCG/ACT IN AERS: INHALATION_SPRAY | RESPIRATORY_TRACT | Status: DC

## 2014-03-22 ENCOUNTER — Other Ambulatory Visit: Payer: Self-pay | Admitting: Family Medicine

## 2014-04-12 ENCOUNTER — Other Ambulatory Visit: Payer: Self-pay | Admitting: Family Medicine

## 2014-04-12 NOTE — Telephone Encounter (Signed)
Last seen 2014

## 2014-04-12 NOTE — Telephone Encounter (Signed)
Patient may have 14 tablets. Must be seen before further refills

## 2014-04-27 ENCOUNTER — Other Ambulatory Visit: Payer: Self-pay | Admitting: Family Medicine

## 2014-05-03 ENCOUNTER — Ambulatory Visit (INDEPENDENT_AMBULATORY_CARE_PROVIDER_SITE_OTHER): Payer: Medicare Other | Admitting: Family Medicine

## 2014-05-03 ENCOUNTER — Encounter: Payer: Self-pay | Admitting: Family Medicine

## 2014-05-03 VITALS — BP 138/84 | Ht 66.0 in | Wt 208.8 lb

## 2014-05-03 DIAGNOSIS — I10 Essential (primary) hypertension: Secondary | ICD-10-CM

## 2014-05-03 MED ORDER — HYDROCHLOROTHIAZIDE 25 MG PO TABS: 25.0000 mg | ORAL_TABLET | Freq: Every morning | ORAL | Status: DC

## 2014-05-03 NOTE — Progress Notes (Signed)
   Subjective:    Patient ID: Tracie Brooks, female    DOB: 1945/09/02, 69 y.o.   MRN: [id number]  HPI Patient arrives for a follow up on blood pressure. Patient reports no problems or concerns.  Patient claims compliance with all blood pressure medication. Trying to watch her diet. Watching salt intake.  Some increased stress lately. Has a new job which is somewhat difficult  Review of Systems No headache no chest pain no back pain ROS otherwise negative    Objective:   Physical Exam  Alert blood pressure 130/82 on repeat. Lungs clear heart regular in rhythm.      Assessment & Plan:  Impression hypertension good control patient states sees the GYN regularly and blood pressure good day or 2. Would like 1 years worth of medicine due to difficulty getting in plan appropriate blood work. Diet exercise discussed medication refilled for 1 year. WSL

## 2014-05-07 LAB — LIPID PANEL
CHOL/HDL RATIO: 5.3 ratio
Cholesterol: 212 mg/dL — ABNORMAL HIGH (ref 0–200)
HDL: 40 mg/dL (ref >39)
LDL Cholesterol: 134 mg/dL — ABNORMAL HIGH (ref 0–99)
Triglycerides: 192 mg/dL — ABNORMAL HIGH (ref <150)
VLDL: 38 mg/dL (ref 0–40)

## 2014-05-07 LAB — HEPATIC FUNCTION PANEL
ALBUMIN: 4 g/dL (ref 3.5–5.2)
ALT: 71 U/L — AB (ref 0–35)
AST: 35 U/L (ref 0–37)
Alkaline Phosphatase: 132 U/L — ABNORMAL HIGH (ref 39–117)
BILIRUBIN DIRECT: 0.1 mg/dL (ref 0.0–0.3)
BILIRUBIN TOTAL: 0.5 mg/dL (ref 0.2–1.2)
Indirect Bilirubin: 0.4 mg/dL (ref 0.2–1.2)
TOTAL PROTEIN: 7 g/dL (ref 6.0–8.3)

## 2014-05-07 LAB — BASIC METABOLIC PANEL
BUN: 18 mg/dL (ref 6–23)
CO2: 29 meq/L (ref 19–32)
Calcium: 11.1 mg/dL — ABNORMAL HIGH (ref 8.4–10.5)
Chloride: 102 mEq/L (ref 96–112)
Creat: 0.96 mg/dL (ref 0.50–1.10)
GLUCOSE: 97 mg/dL (ref 70–99)
Potassium: 4.1 mEq/L (ref 3.5–5.3)
Sodium: 138 mEq/L (ref 135–145)

## 2014-05-19 ENCOUNTER — Ambulatory Visit (INDEPENDENT_AMBULATORY_CARE_PROVIDER_SITE_OTHER): Payer: Medicare Other | Admitting: Family Medicine

## 2014-05-19 ENCOUNTER — Encounter: Payer: Self-pay | Admitting: Family Medicine

## 2014-05-19 ENCOUNTER — Telehealth: Payer: Self-pay | Admitting: Family Medicine

## 2014-05-19 VITALS — BP 130/78 | Temp 98.7°F | Ht 66.0 in | Wt 208.4 lb

## 2014-05-19 DIAGNOSIS — J189 Pneumonia, unspecified organism: Secondary | ICD-10-CM

## 2014-05-19 DIAGNOSIS — J4521 Mild intermittent asthma with (acute) exacerbation: Secondary | ICD-10-CM

## 2014-05-19 MED ORDER — CLARITHROMYCIN 500 MG PO TABS: 500.0000 mg | ORAL_TABLET | Freq: Two times a day (BID) | ORAL | Status: AC

## 2014-05-19 NOTE — Telephone Encounter (Signed)
Transferred pt up front to schedule OV.

## 2014-05-19 NOTE — Telephone Encounter (Signed)
Pt seen on 2/3, wants to know if you will just call her in an  Anti biotic for her bronchial issues instead of her coming back in.  Did not see anything in the OV not stating you spoke to her about this  So I advised she may need to be seen.   Cough worse, sounds like a seal barking. Tessalon pearls do not work for her Congestion, it's triggered her asthma, non productive cough   [facility]

## 2014-05-19 NOTE — Telephone Encounter (Signed)
Sorry we dont call in abx sight unseen,(if i do this now she will always wexpect that, but no need to share that truth with the pt))

## 2014-05-19 NOTE — Progress Notes (Signed)
   Subjective:    Patient ID: Tracie Brooks, female    DOB: [birth year], 69 y.o.   MRN: [id number]  URI  This is a new problem. The current episode started 1 to 4 weeks ago. The problem has been unchanged. There has been no fever. Associated symptoms include congestion, coughing and wheezing. She has tried inhaler use (Robitussin, cough drops) for the symptoms. The treatment provided no relief.   Patient states that she has no other concerns at this time.   Sig asthma uses albuterol when necessary. Generally not every week  No vomiting no diarrhea  Cough and wheezing has persisted for several weeks  Trouble breathing severee coughing spells  Rarely productive  Uses in haler prn four hrs or so    Remote flu vaccine And remote pneum vacc wnt s no further  Review of Systems  HENT: Positive for congestion.   Respiratory: Positive for cough and wheezing.    No vomiting no diarrhea no rash    Objective:   Physical Exam  Alert no major distress. H&T normal lungs by basilar inspiratory crackles positive mild expiratory wheeze no tachypnea heart regular in rhythm      Assessment & Plan:  Impression community acquired pneumonia discussed at great length. Many questions answered #2 exacerbation asthma not enough for steroids at this point. Plan Biaxin 500 twice a day 10 days. Symptomatic care discussed. Albuterol when necessary. 25 minutes spent most in discussion. WSL

## 2014-06-05 ENCOUNTER — Encounter: Payer: Self-pay | Admitting: Family Medicine

## 2014-06-05 ENCOUNTER — Ambulatory Visit (INDEPENDENT_AMBULATORY_CARE_PROVIDER_SITE_OTHER): Payer: Medicare Other | Admitting: Family Medicine

## 2014-06-05 VITALS — BP 154/90 | Temp 98.6°F | Ht 66.0 in | Wt 206.0 lb

## 2014-06-05 DIAGNOSIS — J4531 Mild persistent asthma with (acute) exacerbation: Secondary | ICD-10-CM

## 2014-06-05 MED ORDER — LEVOFLOXACIN 500 MG PO TABS: 500.0000 mg | ORAL_TABLET | Freq: Every day | ORAL | Status: AC

## 2014-06-05 MED ORDER — FLUTICASONE PROPIONATE HFA 220 MCG/ACT IN AERO: 2.0000 | INHALATION_SPRAY | Freq: Two times a day (BID) | RESPIRATORY_TRACT | Status: DC

## 2014-06-05 NOTE — Progress Notes (Signed)
   Subjective:    Patient ID: Tracie Brooks, female    DOB: 04-28-1945, 69 y.o.   MRN: [id number]  Cough This is a new problem. The current episode started 1 to 4 weeks ago. Associated symptoms include wheezing. Treatments tried: inhaler. The treatment provided no relief. Her past medical history is significant for asthma and pneumonia.   Seen a couple of weeks ago and diagnosed with pneumonia. Finished antibiotic.   Patient still having substantial wheezing. Significant history of asthma in the past.  Patient states albuterol is not helping enough. Next  Cough day and night.  Diminished energy and stressed out with her situation   Review of Systems  Respiratory: Positive for cough and wheezing.    No vomiting no diarrhea    Objective:   Physical Exam Alert no apparent distress. HEENT normal. Lungs bilateral substantial wheezes no tachypnea no inspiratory crackles heart regular in rhythm       Assessment & Plan:  Impression status post pneumonia with persistent reactive airways. Long discussion held. Easily 25 minutes spent most in discussion plan patient declines steroid taper. Initiate Flovent 220 g 2 puffs twice a day. Use albuterol freely one more round of appropriate antibiotics warning signs discussed WSL

## 2014-06-21 ENCOUNTER — Ambulatory Visit (INDEPENDENT_AMBULATORY_CARE_PROVIDER_SITE_OTHER): Payer: Medicare Other | Admitting: Family Medicine

## 2014-06-21 ENCOUNTER — Encounter: Payer: Self-pay | Admitting: Family Medicine

## 2014-06-21 VITALS — BP 128/78 | Ht 66.0 in | Wt 208.0 lb

## 2014-06-21 DIAGNOSIS — J1189 Influenza due to unidentified influenza virus with other manifestations: Secondary | ICD-10-CM | POA: Diagnosis not present

## 2014-06-21 DIAGNOSIS — J111 Influenza due to unidentified influenza virus with other respiratory manifestations: Secondary | ICD-10-CM

## 2014-06-21 MED ORDER — OSELTAMIVIR PHOSPHATE 75 MG PO CAPS: 75.0000 mg | ORAL_CAPSULE | Freq: Two times a day (BID) | ORAL | Status: DC

## 2014-06-21 NOTE — Progress Notes (Signed)
   Subjective:    Patient ID: Tracie Brooks, female    DOB: May 06, 1945, 69 y.o.   MRN: [id number]  Fever  This is a new problem. The current episode started yesterday. The problem has been gradually worsening. Associated symptoms include congestion, coughing, headaches and muscle aches. Associated symptoms comments: chills. She has tried acetaminophen for the symptoms. The treatment provided mild relief.   Feeling yucky  Skin got tender   Worsened throughout the day  appetitea d poor energy  No joint pain  Wheezing gone last wk,  Used the steroid inhaler twice per day  Then allergies started   Not a flu shot person  tmax 99.9     Review of Systems  Constitutional: Positive for fever.  HENT: Positive for congestion.   Respiratory: Positive for cough.   Neurological: Positive for headaches.       Objective:   Physical Exam Alert moderate malaise. Low-grade temp HEENT moderate nasal congestion neck supple wheezy cough no wheezes auscultated no crackles no tachypnea heart regular in rhythm.       Assessment & Plan:  Impression influenza with mild flare of asthma plan albuterol faithfully. Tamiflu twice a day 5 days. Symptomatic care discussed. WSL

## 2014-10-26 ENCOUNTER — Ambulatory Visit (INDEPENDENT_AMBULATORY_CARE_PROVIDER_SITE_OTHER): Payer: Medicare Other | Admitting: Family Medicine

## 2014-10-26 ENCOUNTER — Encounter: Payer: Self-pay | Admitting: Family Medicine

## 2014-10-26 ENCOUNTER — Ambulatory Visit (HOSPITAL_COMMUNITY)
Admission: RE | Admit: 2014-10-26 | Discharge: 2014-10-26 | Disposition: A | Discharge status: Home / Self Care | Payer: Medicare Other | Source: Ambulatory Visit | Attending: Family Medicine | Admitting: Family Medicine

## 2014-10-26 ENCOUNTER — Other Ambulatory Visit: Payer: Self-pay | Admitting: Obstetrics & Gynecology

## 2014-10-26 VITALS — BP 130/78 | Ht 66.0 in | Wt 216.0 lb

## 2014-10-26 DIAGNOSIS — M25761 Osteophyte, right knee: Secondary | ICD-10-CM | POA: Insufficient documentation

## 2014-10-26 DIAGNOSIS — M25461 Effusion, right knee: Secondary | ICD-10-CM | POA: Diagnosis not present

## 2014-10-26 DIAGNOSIS — M13861 Other specified arthritis, right knee: Secondary | ICD-10-CM | POA: Insufficient documentation

## 2014-10-26 DIAGNOSIS — M1990 Unspecified osteoarthritis, unspecified site: Secondary | ICD-10-CM

## 2014-10-26 DIAGNOSIS — M25561 Pain in right knee: Secondary | ICD-10-CM | POA: Insufficient documentation

## 2014-10-26 DIAGNOSIS — Z1231 Encounter for screening mammogram for malignant neoplasm of breast: Secondary | ICD-10-CM

## 2014-10-26 NOTE — Progress Notes (Signed)
   Subjective:    Patient ID: Tracie Brooks, female    DOB: 08-07-1945, 69 y.o.   MRN: [id number]  HPI  Patient arrives with c/o rt knee pain and swelling for about a week.  Hx of arthroscopic surg in the involved knee  Found to have arthritis at that time  Last wk, nothing hppened, noted swelling and stiffness and difficulty walking  Pt used ice and essential oil and glucosamine chondroiton   Went to [employer] on Monday,  Started popping with movement, not always but frequently  Constantly aches and at times dull  No trauma  Was walking regulalry and then stopped  Broke out in rash behind kneee   Felt warm to touch   Patient saw chiropractor who stated it needs to be x rayed.  Review of Systems    no hip pain positive family history of arthritis positive hand arthritis history. No fever or chills Objective:   Physical Exam  Alert vitals stable. Lungs clear. Heart regular in rhythm. Right knee positive crepitations effusion evident      Assessment & Plan:  Impression arthritis of right knee. X-ray confirms cartilage thinning and osteophyte changes along with diffusion plan patient resistant anti-inflammatory more into essential oils etc. To continue chiropractic therapy for now. May add Aleve if necessary WSL exercise encouraged

## 2014-11-06 ENCOUNTER — Ambulatory Visit (HOSPITAL_COMMUNITY)
Admission: RE | Admit: 2014-11-06 | Discharge: 2014-11-06 | Disposition: A | Discharge status: Home / Self Care | Payer: Medicare Other | Source: Ambulatory Visit | Attending: Obstetrics & Gynecology | Admitting: Obstetrics & Gynecology

## 2014-11-06 DIAGNOSIS — Z1231 Encounter for screening mammogram for malignant neoplasm of breast: Secondary | ICD-10-CM | POA: Insufficient documentation

## 2014-11-10 ENCOUNTER — Other Ambulatory Visit: Payer: Self-pay | Admitting: Family Medicine

## 2015-05-09 ENCOUNTER — Ambulatory Visit (INDEPENDENT_AMBULATORY_CARE_PROVIDER_SITE_OTHER): Payer: Medicare Other | Admitting: Family Medicine

## 2015-05-09 ENCOUNTER — Encounter: Payer: Self-pay | Admitting: Family Medicine

## 2015-05-09 VITALS — BP 128/80 | Ht 66.0 in | Wt 216.2 lb

## 2015-05-09 DIAGNOSIS — Z79899 Other long term (current) drug therapy: Secondary | ICD-10-CM

## 2015-05-09 DIAGNOSIS — I10 Essential (primary) hypertension: Secondary | ICD-10-CM

## 2015-05-09 DIAGNOSIS — R748 Abnormal levels of other serum enzymes: Secondary | ICD-10-CM

## 2015-05-09 DIAGNOSIS — Z13220 Encounter for screening for lipoid disorders: Secondary | ICD-10-CM

## 2015-05-09 DIAGNOSIS — L309 Dermatitis, unspecified: Secondary | ICD-10-CM

## 2015-05-09 DIAGNOSIS — J4521 Mild intermittent asthma with (acute) exacerbation: Secondary | ICD-10-CM

## 2015-05-09 MED ORDER — HYDROCHLOROTHIAZIDE 25 MG PO TABS: 25.0000 mg | ORAL_TABLET | Freq: Every morning | ORAL | Status: DC

## 2015-05-09 NOTE — Progress Notes (Signed)
   Subjective:    Patient ID: Tracie Brooks, female    DOB: 1945-12-12, 70 y.o.   MRN: [id number]  Hypertension This is a chronic problem. The current episode started more than 1 year ago. Risk factors for coronary artery disease include post-menopausal state. Treatments tried: hctz. There are no compliance problems.    Patient states she has had quite a time with a rash since [facility] dermatologist(Dr Dazey) who dx with shingles but she is not too sure he was right  . Lidocaine cream she was given burned her and rash worse and she had to go see PA at dermatologist and got prednisone rx.   Pt has had ongoing challenges with rash  Excess calcium supplement in the skin, pt noted had been taking excessive dose of Vit D, and pt had been taking  Patient states rash moved to back and saw another dermatologist and had a biopsy and dx calcium on skin and contact dermatologist and they told her she was allergic to the prednisone but she doesn't feel this is true-she feels it is related more to ibuprofen she was taking everyday for the pain.   Patient states she now has scarring on back.  Patient has female exam thru gyn.  Patient states she feels it is time for labs. Patient states Dr [place] Crane wants [place] to include some labs he rec with our labs  bp NUMBERS OFF and on, genrall good. Nub generally 120 over 70 on average,  Usually one teens over 70ish  Pt thought she may rxn to ibuprofen and now skin has caused trouble   Review of Systems No headache no chest pain no back pain no abdominal pain no change in bowel habits ROS otherwise negative    Objective:   Physical Exam  Alert vitals stable. Back nonspecific maculopapular rash. Lungs clear. Heart regular in rhythm HEENT normal blood pressure repeat 124/78.      Assessment & Plan:  Impression #1 hyperlipidemia status on certain discussed #2 asthma clinically stable on use and rare when necessary albuterol fortunately #3 hypertension good  control #4 curious persistent rash with question of excess calcium on biopsy and patient admits to vitamin D hypervitaminosis plan appropriate blood work. Weight 2 weeks. Chronic meds refilled. Diet exercise discussed. Easily 25 minutes spent most in discussion

## 2015-06-03 LAB — BASIC METABOLIC PANEL
BUN/Creatinine Ratio: 17 (ref 11–26)
BUN: 20 mg/dL (ref 8–27)
CO2: 27 mmol/L (ref 18–29)
CREATININE: 1.15 mg/dL — AB (ref 0.57–1.00)
Calcium: 11 mg/dL — ABNORMAL HIGH (ref 8.7–10.3)
Chloride: 99 mmol/L (ref 96–106)
GFR, EST AFRICAN AMERICAN: 56 mL/min/{1.73_m2} — AB (ref >59)
GFR, EST NON AFRICAN AMERICAN: 49 mL/min/{1.73_m2} — AB (ref >59)
Glucose: 103 mg/dL — ABNORMAL HIGH (ref 65–99)
POTASSIUM: 4.4 mmol/L (ref 3.5–5.2)
Sodium: 142 mmol/L (ref 134–144)

## 2015-06-03 LAB — APTT: APTT: 27 s (ref 24–33)

## 2015-06-03 LAB — CBC WITH DIFFERENTIAL/PLATELET
Basophils Absolute: 0 10*3/uL (ref 0.0–0.2)
Basos: 0 %
EOS (ABSOLUTE): 0.3 10*3/uL (ref 0.0–0.4)
Eos: 5 %
HEMATOCRIT: 39.2 % (ref 34.0–46.6)
HEMOGLOBIN: 13.1 g/dL (ref 11.1–15.9)
Immature Grans (Abs): 0 10*3/uL (ref 0.0–0.1)
Immature Granulocytes: 0 %
Lymphocytes Absolute: 0.7 10*3/uL (ref 0.7–3.1)
Lymphs: 12 %
MCH: 31 pg (ref 26.6–33.0)
MCHC: 33.4 g/dL (ref 31.5–35.7)
MCV: 93 fL (ref 79–97)
MONOCYTES: 9 %
Monocytes Absolute: 0.5 10*3/uL (ref 0.1–0.9)
NEUTROS PCT: 74 %
Neutrophils Absolute: 4.5 10*3/uL (ref 1.4–7.0)
Platelets: 257 10*3/uL (ref 150–379)
RBC: 4.22 x10E6/uL (ref 3.77–5.28)
RDW: 14.4 % (ref 12.3–15.4)
WBC: 6.1 10*3/uL (ref 3.4–10.8)

## 2015-06-03 LAB — LIPID PANEL
CHOLESTEROL TOTAL: 243 mg/dL — AB (ref 100–199)
Chol/HDL Ratio: 3.8 ratio units (ref 0.0–4.4)
HDL: 64 mg/dL (ref >39)
LDL Calculated: 156 mg/dL — ABNORMAL HIGH (ref 0–99)
TRIGLYCERIDES: 114 mg/dL (ref 0–149)
VLDL CHOLESTEROL CAL: 23 mg/dL (ref 5–40)

## 2015-06-03 LAB — HEPATIC FUNCTION PANEL
ALT: 668 IU/L — AB (ref 0–32)
AST: 396 IU/L — AB (ref 0–40)
Albumin: 4.5 g/dL (ref 3.6–4.8)
Alkaline Phosphatase: 222 IU/L — ABNORMAL HIGH (ref 39–117)
BILIRUBIN TOTAL: 0.5 mg/dL (ref 0.0–1.2)
Bilirubin, Direct: 0.22 mg/dL (ref 0.00–0.40)
Total Protein: 6.9 g/dL (ref 6.0–8.5)

## 2015-06-03 LAB — PROTIME-INR
INR: 1 (ref 0.8–1.2)
PROTHROMBIN TIME: 10 s (ref 9.1–12.0)

## 2015-06-03 LAB — PTH, INTACT AND CALCIUM: PTH: 44 pg/mL (ref 15–65)

## 2015-06-04 ENCOUNTER — Encounter: Payer: Self-pay | Admitting: Family Medicine

## 2015-06-04 NOTE — Addendum Note (Signed)
Addended by: Launa Grill on: 06/04/2015 02:34 PM   Modules accepted: Orders

## 2015-06-05 ENCOUNTER — Telehealth: Payer: Self-pay | Admitting: Family Medicine

## 2015-06-05 ENCOUNTER — Encounter (INDEPENDENT_AMBULATORY_CARE_PROVIDER_SITE_OTHER): Payer: Self-pay | Admitting: *Deleted

## 2015-06-05 NOTE — Telephone Encounter (Signed)
Pt aware that we are recommending a g i referral, her choice to delay follow up

## 2015-06-05 NOTE — Telephone Encounter (Signed)
Patient came in to discuss lab results from most recent labs. Patient now declines going to GI specialist stating that she researched Red yeast supplements and saw that they can cause elevated liver enzymes. Patient would like to repeat lab work in 6 months and see what the results are. Patient also declines going to see Dr.Nida for elevated calcium stating that she would prefer to find her own endocrinologist and if she needs a referral she will call us.

## 2015-06-06 NOTE — Telephone Encounter (Signed)
Spoke with patient and informed her per Dr.Steve Luking- We have made the referral to GI and it is her choice to delay follow up. Patient verbalized understanding and also stated that she is in the process of working on her own referral to endocrinology due to not want to see the doctor we originally referred her to.

## 2015-06-06 NOTE — Telephone Encounter (Signed)
[facility] 06/06/15

## 2015-06-13 ENCOUNTER — Ambulatory Visit (INDEPENDENT_AMBULATORY_CARE_PROVIDER_SITE_OTHER): Payer: Medicare Other | Admitting: Internal Medicine

## 2015-07-06 ENCOUNTER — Telehealth: Payer: Self-pay | Admitting: Family Medicine

## 2015-07-06 NOTE — Telephone Encounter (Signed)
Caryl Pina at [facility] called in regards to medical records that were faxed over from [place] for patient.  I found the release that the patient signed.  Caryl Pina says that the patient has never been seen there nor has an appointment.  I tried calling the patient to verify the doctor, but the voicemail was full.  The doctor needs to be clarified and if it is the wrong doctor, a new release will need to be completed.

## 2015-07-31 ENCOUNTER — Telehealth: Payer: Self-pay | Admitting: Family Medicine

## 2015-07-31 DIAGNOSIS — R748 Abnormal levels of other serum enzymes: Secondary | ICD-10-CM

## 2015-07-31 NOTE — Telephone Encounter (Signed)
Spoke with patient and informed her per Dr.Steve Luking- referral to gastroenterology was ordered. Patient verbalized understanding.

## 2015-07-31 NOTE — Telephone Encounter (Signed)
Lets do, i advised this awhile back

## 2015-07-31 NOTE — Telephone Encounter (Signed)
Pt called requesting referral to Dr. Oneida Alar at [place] she has elevated liver enzymes States she had a bad experience with previous GI & has heard that Dr. Oneida Alar is really good  Please advise

## 2015-08-01 ENCOUNTER — Encounter: Payer: Self-pay | Admitting: Family Medicine

## 2015-08-06 ENCOUNTER — Encounter: Payer: Self-pay | Admitting: Gastroenterology

## 2015-08-09 ENCOUNTER — Other Ambulatory Visit: Payer: Self-pay | Admitting: Family Medicine

## 2015-08-29 ENCOUNTER — Encounter: Payer: Self-pay | Admitting: Gastroenterology

## 2015-08-29 ENCOUNTER — Other Ambulatory Visit: Payer: Self-pay

## 2015-08-29 ENCOUNTER — Ambulatory Visit (INDEPENDENT_AMBULATORY_CARE_PROVIDER_SITE_OTHER): Payer: Medicare Other | Admitting: Gastroenterology

## 2015-08-29 ENCOUNTER — Encounter (INDEPENDENT_AMBULATORY_CARE_PROVIDER_SITE_OTHER): Payer: Self-pay

## 2015-08-29 VITALS — BP 141/76 | HR 79 | Temp 97.2°F | Ht 66.0 in | Wt 215.4 lb

## 2015-08-29 DIAGNOSIS — Z1211 Encounter for screening for malignant neoplasm of colon: Secondary | ICD-10-CM | POA: Diagnosis not present

## 2015-08-29 DIAGNOSIS — R748 Abnormal levels of other serum enzymes: Secondary | ICD-10-CM | POA: Diagnosis not present

## 2015-08-29 LAB — COMPLETE METABOLIC PANEL WITH GFR
ALT: 38 U/L — ABNORMAL HIGH (ref 6–29)
AST: 32 U/L (ref 10–35)
Albumin: 3.8 g/dL (ref 3.6–5.1)
Alkaline Phosphatase: 109 U/L (ref 33–130)
BUN: 19 mg/dL (ref 7–25)
CHLORIDE: 106 mmol/L (ref 98–110)
CO2: 29 mmol/L (ref 20–31)
Calcium: 9.8 mg/dL (ref 8.6–10.4)
Creat: 0.94 mg/dL (ref 0.50–0.99)
GFR, Est African American: 72 mL/min (ref >=60)
GFR, Est Non African American: 62 mL/min (ref >=60)
GLUCOSE: 115 mg/dL — AB (ref 65–99)
POTASSIUM: 4 mmol/L (ref 3.5–5.3)
SODIUM: 143 mmol/L (ref 135–146)
Total Bilirubin: 0.4 mg/dL (ref 0.2–1.2)
Total Protein: 6.2 g/dL (ref 6.1–8.1)

## 2015-08-29 NOTE — Assessment & Plan Note (Signed)
AVERAGE RISK-NO WARNING SIGNS/SYMPTOMS. LAST TCS 2005-NO POLYPS/PROBLEMS.  TCS JUN 2017-SUPREP, FULL LIQUIDS WITH BREAKFAST. DISCUSSED PROCEDURE, BENEFITS, & RISKS: < 1% chance of medication reaction, bleeding, perforation, or rupture of spleen/liver.

## 2015-08-29 NOTE — Progress Notes (Signed)
ON RECALL  °

## 2015-08-29 NOTE — Progress Notes (Signed)
Subjective:    Patient ID: Tracie Brooks, female    DOB: October 28, 1945, 70 y.o.   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  WAS TAKING ACETAMINOPHEN A COUPLE EVERY NIGHT. ALSO WAS TAKING IBUPROFEN 2-3X/DAY. WEIGHT NOW 215 LBS. STOPPED IBUPROFEN DUE TO A REACTION AND CUT BACK ON TYLENOL AT THE SAME TIME. PICKS UP HEAVY GRAND CHILDREN AND WHEN SHE PICKS THEM UP ITY HURTS HER BACK. STOPPED TAKING TYLENOL. DAILY IN [facility] [id number]. STOPPED RED YEATS RICE AS WELL IN [place]. NO IBUPROFEN SINCE DEC 2016.  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 BACK SKIN ERUPTION-CALCIUM DEPOSIT. HAVING BROWN SPOTS ON HER ARMS. ITCHING ALL OVER HER BODY. FATIGUE: TIRED ALL THE TIME. [place] THE ENDOCRINOLOGIST NEXT WEEK(JUN 6). TAKES MANY SUPPLEMENTS FOR PAST 10 YEARS. WEIGHT LOSS: NO. APPETITE: GOOD. TRAVEL: NO. BMs: COUPLE TIMES A DAY(#4). SWELLING IN LEGS FOR YEARS. NO SWELLING IN ABDOMEN. NO ETOH.   PT DENIES FEVER, CHILLS, HEMATOCHEZIA,  nausea, vomiting, melena, diarrhea, CHEST PAIN, SHORTNESS OF BREATH,  CHANGE IN BOWEL IN HABITS, constipation, abdominal pain, problems swallowing, problems with sedation, OR heartburn or indigestion.   Past Medical History  Diagnosis Date  . Hypertension   . Asthma   . Dyslipidemia   . Chest pain   . Hyperlipidemia   . IBS (irritable bowel syndrome)   . Allergy    Past Surgical History  Procedure Laterality Date  . Abdominal hysterectomy    . Colonoscopy    . Knee arthroscopy      right 1982  . Biopsy breast  1985   Allergies  Allergen Reactions  . Beta Adrenergic Blockers     swelling  . Ibuprofen   . Levsin [Hyoscyamine Sulfate]     Blurred vision  . Lidocaine     cream  . Pravastatin     Joint pain  . Prednisone   . Sulfa Antibiotics   . Zocor [Simvastatin]     Joint pain   Current Outpatient Prescriptions    Medication Sig Dispense Refill  . TYLENOL 325 MG tablet Take 650 mg by mouth every 6 (six) hours as needed.    Marland Kitchen FLOVENT HFA 220 MCG/ACT inhaler INHALE 2 PUFFS INTO THE LUNGS 2 (TWO) TIMES DAILY.    . hydrochlorothiazide 25 MG tablet Take 1 tablet (25 mg total) by mouth every morning.    . NON FORMULARY elecutherococcus take 3 qd    . Probiotic Product Take by mouth.    . VENTOLIN HFA 108 MCG/ACT inhaler USE 2 PUFFS EVERY 4 TO 6 HOURS AS NEEDED    .       Supplements: SIBERIAN GINSENG, MONOLAURIN, CURCUMIN, PROBIOTIC, PLANTERY MULLEIN LING, COMPLEX, RAW B-COMPLEX, RAW B-12, MILK THISTLE (BEEN AT LEAST SINCE 2013)   Family History  Problem Relation Age of Onset  . Hypertension Mother   . Hyperlipidemia Mother   . Arthritis Mother   . Hypertension Father   . Heart disease Father    Review of Systems PER HPI OTHERWISE ALL SYSTEMS ARE NEGATIVE.    Objective:   Physical Exam  Constitutional: She is oriented to person, place, and time. She appears well-developed and well-nourished. No distress.  HENT:  Head: Normocephalic and atraumatic.  Mouth/Throat: Oropharynx is clear and moist. No oropharyngeal exudate.  Eyes: Pupils are equal, round, and reactive to light. No scleral icterus.  Neck: Normal range  of motion. Neck supple.  Cardiovascular: Normal rate, regular rhythm and normal heart sounds.   Pulmonary/Chest: Effort normal and breath sounds normal. No respiratory distress.  Abdominal: Soft. Bowel sounds are normal. She exhibits no distension. There is no tenderness.  Musculoskeletal: She exhibits edema (BILATERAL 1+  PEDAL EDEMA).  Lymphadenopathy:    She has no cervical adenopathy.  Neurological: She is alert and oriented to person, place, and time.  NO FOCAL DEFICITS  Psychiatric: She has a normal mood and affect.  Vitals reviewed.     Assessment & Plan:

## 2015-08-29 NOTE — Assessment & Plan Note (Signed)
MAY BE ASSOCIATED WITH SUPPLEMENTS, LESS LIKELY AUTOIMMUNE, OR INFILTRATIVE, OR TB. LAST CMP MAR 2017.  RECHECK CMP TODAY. IF ELEVATED NEED VIRAL SEROLOGIES AND FOR AIH. CONSIDER LIVER BIOPSY. WILL NEED U/S. FOLLOW UP IN 4 MOS.

## 2015-08-29 NOTE — Patient Instructions (Signed)
COMPLETE LAB TODAY. IF YOU LIVER ENZYMES ARE STILL ELEVATED THEN YOU WILL NEED LABS TO CHECK FOR AUTOIMMUNE AND VIRAL HEPATITIS AS WELL AS AN ULTRASOUND.  COMPLETE COLONOSCOPY JUN 2017. FOLLOW A FULL LIQUID DIET ON DAY PRIOR TO YOUR COLONOSCOPY. SEE INFO BELOW.  FOLLOW UP IN 4 MOS.    Full Liquid Diet A high-calorie, high-protein supplement should be used to meet your nutritional requirements when the full liquid diet is continued for more than 2 or 3 days. If this diet is to be used for an extended period of time (more than 7 days), a multivitamin should be considered.  Breads and Starches  Allowed: None are allowed except crackers   Avoid: Any others.    Potatoes/Pasta/Rice  Allowed: ANY ITEM AS A SOUP OR SMALL PLATE OF MASHED POTATOES OR SCRAMBLED EGGS.       Vegetables  Allowed: Strained tomato or vegetable juice. Vegetables pureed in soup.   Avoid: Any others.    Fruit  Allowed: Any strained fruit juices and fruit drinks. Include 1 serving of citrus or vitamin C-enriched fruit juice daily.   Avoid: Any others.  Meat and Meat Substitutes  Allowed: Egg  Avoid: Any meat, fish, or fowl. All cheese.  Milk  Allowed: SOY Milk beverages, including milk shakes and instant breakfast mixes. Smooth yogurt.   Avoid: Any others. Avoid dairy products if not tolerated.    Soups and Combination Foods  Allowed: Broth, strained cream soups. Strained, broth-based soups.   Avoid: Any others.    Desserts and Sweets  Allowed: flavored gelatin, tapioca, ice cream, sherbet, smooth pudding, junket, fruit ices, frozen ice pops, pudding pops, frozen fudge pops, chocolate syrup. Sugar, honey, jelly, syrup.   Avoid: Any others.  Fats and Oils  Allowed: Margarine, butter, cream, sour cream, oils.   Avoid: Any others.  Beverages  Allowed: All.   Avoid: None.  Condiments  Allowed: Iodized salt, pepper, spices, flavorings. Cocoa powder.   Avoid: Any others.    SAMPLE  MEAL PLAN Breakfast   cup orange juice.   1 OR 2 EGGS  1 cup milk.   1 cup beverage (coffee or tea).   Cream or sugar, if desired.    Midmorning Snack  2 SCRAMBLED OR HARD BOILED EGG   Lunch  1 cup cream soup.    cup fruit juice.   1 cup milk.    cup custard.   1 cup beverage (coffee or tea).   Cream or sugar, if desired.    Midafternoon Snack  1 cup milk shake.  Dinner  1 cup cream soup.    cup fruit juice.   1 cup MILK    cup pudding.   1 cup beverage (coffee or tea).   Cream or sugar, if desired.  Evening Snack  1 cup supplement.  To increase calories, add sugar, cream, butter, or margarine if possible. Nutritional supplements will also increase the total calories.

## 2015-08-29 NOTE — Progress Notes (Signed)
cc'ed to pcp °

## 2015-08-30 ENCOUNTER — Other Ambulatory Visit: Payer: Self-pay

## 2015-08-30 ENCOUNTER — Telehealth: Payer: Self-pay | Admitting: Gastroenterology

## 2015-08-30 DIAGNOSIS — R7989 Other specified abnormal findings of blood chemistry: Secondary | ICD-10-CM

## 2015-08-30 DIAGNOSIS — R945 Abnormal results of liver function studies: Secondary | ICD-10-CM

## 2015-08-30 NOTE — Telephone Encounter (Signed)
PLEASE CALL PT. HER LIVER PANEL IS NEAR NORMAL. THE ACUTE ELEVATION IN HER ENZYMES MAY HAVE BEEN DUE TO THE RED YEAST RICE. THE ONLY ABNORMAL FINDING IS AN ALT 38 WHICH IS MOST LIKELY DUE TO FATTY LIVER DISEASE. SHE SHOULD LOSE TEN POUNDS AND THE LIVER ENZYMES WILL LIKELY GO BACK TO NORMAL. IT IS ESSENTIAL THAT SHE LOSE 10 POUNDS BECAUSE ONLY FOLKS WITH FAT IN THEIR LIVER AND ELEVATED ALT ARE AT RISK FOR DEVELOPING CIRRHOSIS. SHE DOES NOT NEED ADDITIONAL WORKUP AT THIS TIME. SHE SHOULD HAVE A REPEAT LIVER PANEL IN 6 MOS.

## 2015-08-30 NOTE — Telephone Encounter (Signed)
Pt is aware of results and lab order on file for 02/29/2016.

## 2015-08-31 NOTE — Telephone Encounter (Signed)
Reminder in epic °

## 2015-09-03 ENCOUNTER — Encounter (HOSPITAL_COMMUNITY): Payer: Self-pay | Admitting: *Deleted

## 2015-09-03 ENCOUNTER — Ambulatory Visit (HOSPITAL_COMMUNITY)
Admission: RE | Admit: 2015-09-03 | Discharge: 2015-09-03 | Disposition: A | Discharge status: Home / Self Care | Payer: Medicare Other | Source: Ambulatory Visit | Attending: Gastroenterology | Admitting: Gastroenterology

## 2015-09-03 ENCOUNTER — Encounter (HOSPITAL_COMMUNITY)
Admission: RE | Disposition: A | Discharge status: Home / Self Care | Payer: Self-pay | Source: Ambulatory Visit | Attending: Gastroenterology

## 2015-09-03 DIAGNOSIS — K5730 Diverticulosis of large intestine without perforation or abscess without bleeding: Secondary | ICD-10-CM | POA: Diagnosis not present

## 2015-09-03 DIAGNOSIS — E785 Hyperlipidemia, unspecified: Secondary | ICD-10-CM | POA: Insufficient documentation

## 2015-09-03 DIAGNOSIS — K621 Rectal polyp: Secondary | ICD-10-CM

## 2015-09-03 DIAGNOSIS — K589 Irritable bowel syndrome without diarrhea: Secondary | ICD-10-CM | POA: Diagnosis not present

## 2015-09-03 DIAGNOSIS — Z79899 Other long term (current) drug therapy: Secondary | ICD-10-CM | POA: Insufficient documentation

## 2015-09-03 DIAGNOSIS — I10 Essential (primary) hypertension: Secondary | ICD-10-CM | POA: Diagnosis not present

## 2015-09-03 DIAGNOSIS — K648 Other hemorrhoids: Secondary | ICD-10-CM | POA: Diagnosis not present

## 2015-09-03 DIAGNOSIS — J45909 Unspecified asthma, uncomplicated: Secondary | ICD-10-CM | POA: Diagnosis not present

## 2015-09-03 DIAGNOSIS — Z1211 Encounter for screening for malignant neoplasm of colon: Secondary | ICD-10-CM | POA: Diagnosis not present

## 2015-09-03 HISTORY — PX: COLONOSCOPY: SHX5424

## 2015-09-03 SURGERY — COLONOSCOPY
Anesthesia: Moderate Sedation

## 2015-09-03 MED ORDER — MIDAZOLAM HCL 5 MG/5ML IJ SOLN
INTRAMUSCULAR | Status: AC
  Filled 2015-09-03: qty 10

## 2015-09-03 MED ORDER — MEPERIDINE HCL 100 MG/ML IJ SOLN
INTRAMUSCULAR | Status: DC | PRN
  Administered 2015-09-03 (×3): 25 mg via INTRAVENOUS

## 2015-09-03 MED ORDER — SODIUM CHLORIDE 0.9 % IV SOLN
INTRAVENOUS | Status: DC
  Administered 2015-09-03: 1000 mL via INTRAVENOUS

## 2015-09-03 MED ORDER — STERILE WATER FOR IRRIGATION IR SOLN
Status: DC | PRN
  Administered 2015-09-03: 15:00:00

## 2015-09-03 MED ORDER — MEPERIDINE HCL 100 MG/ML IJ SOLN
INTRAMUSCULAR | Status: AC
  Filled 2015-09-03: qty 2

## 2015-09-03 MED ORDER — MIDAZOLAM HCL 5 MG/5ML IJ SOLN
INTRAMUSCULAR | Status: DC | PRN
  Administered 2015-09-03: 1 mg via INTRAVENOUS
  Administered 2015-09-03 (×2): 2 mg via INTRAVENOUS

## 2015-09-03 NOTE — H&P (View-Only) (Signed)
Subjective:    Patient ID: Tracie Brooks, female    DOB: Sep 06, 1945, 70 y.o.   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  WAS TAKING ACETAMINOPHEN A COUPLE EVERY NIGHT. ALSO WAS TAKING IBUPROFEN 2-3X/DAY. WEIGHT NOW 215 LBS. STOPPED IBUPROFEN DUE TO A REACTION AND CUT BACK ON TYLENOL AT THE SAME TIME. PICKS UP HEAVY GRAND CHILDREN AND WHEN SHE PICKS THEM UP ITY HURTS HER BACK. STOPPED TAKING TYLENOL. DAILY IN [facility] [id number]. STOPPED RED YEATS RICE AS WELL IN [place]. NO IBUPROFEN SINCE DEC 2016.  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 BACK SKIN ERUPTION-CALCIUM DEPOSIT. HAVING BROWN SPOTS ON HER ARMS. ITCHING ALL OVER HER BODY. FATIGUE: TIRED ALL THE TIME. [place] THE ENDOCRINOLOGIST NEXT WEEK(JUN 6). TAKES MANY SUPPLEMENTS FOR PAST 10 YEARS. WEIGHT LOSS: NO. APPETITE: GOOD. TRAVEL: NO. BMs: COUPLE TIMES A DAY(#4). SWELLING IN LEGS FOR YEARS. NO SWELLING IN ABDOMEN. NO ETOH.   PT DENIES FEVER, CHILLS, HEMATOCHEZIA,  nausea, vomiting, melena, diarrhea, CHEST PAIN, SHORTNESS OF BREATH,  CHANGE IN BOWEL IN HABITS, constipation, abdominal pain, problems swallowing, problems with sedation, OR heartburn or indigestion.   Past Medical History  Diagnosis Date  . Hypertension   . Asthma   . Dyslipidemia   . Chest pain   . Hyperlipidemia   . IBS (irritable bowel syndrome)   . Allergy    Past Surgical History  Procedure Laterality Date  . Abdominal hysterectomy    . Colonoscopy    . Knee arthroscopy      right 1982  . Biopsy breast  1985   Allergies  Allergen Reactions  . Beta Adrenergic Blockers     swelling  . Ibuprofen   . Levsin [Hyoscyamine Sulfate]     Blurred vision  . Lidocaine     cream  . Pravastatin     Joint pain  . Prednisone   . Sulfa Antibiotics   . Zocor [Simvastatin]     Joint pain   Current Outpatient Prescriptions    Medication Sig Dispense Refill  . TYLENOL 325 MG tablet Take 650 mg by mouth every 6 (six) hours as needed.    Marland Kitchen FLOVENT HFA 220 MCG/ACT inhaler INHALE 2 PUFFS INTO THE LUNGS 2 (TWO) TIMES DAILY.    . hydrochlorothiazide 25 MG tablet Take 1 tablet (25 mg total) by mouth every morning.    . NON FORMULARY elecutherococcus take 3 qd    . Probiotic Product Take by mouth.    . VENTOLIN HFA 108 MCG/ACT inhaler USE 2 PUFFS EVERY 4 TO 6 HOURS AS NEEDED    .       Supplements: SIBERIAN GINSENG, MONOLAURIN, CURCUMIN, PROBIOTIC, PLANTERY MULLEIN LING, COMPLEX, RAW B-COMPLEX, RAW B-12, MILK THISTLE (BEEN AT LEAST SINCE 2013)   Family History  Problem Relation Age of Onset  . Hypertension Mother   . Hyperlipidemia Mother   . Arthritis Mother   . Hypertension Father   . Heart disease Father    Review of Systems PER HPI OTHERWISE ALL SYSTEMS ARE NEGATIVE.    Objective:   Physical Exam  Constitutional: She is oriented to person, place, and time. She appears well-developed and well-nourished. No distress.  HENT:  Head: Normocephalic and atraumatic.  Mouth/Throat: Oropharynx is clear and moist. No oropharyngeal exudate.  Eyes: Pupils are equal, round, and reactive to light. No scleral icterus.  Neck: Normal range  of motion. Neck supple.  Cardiovascular: Normal rate, regular rhythm and normal heart sounds.   Pulmonary/Chest: Effort normal and breath sounds normal. No respiratory distress.  Abdominal: Soft. Bowel sounds are normal. She exhibits no distension. There is no tenderness.  Musculoskeletal: She exhibits edema (BILATERAL 1+  PEDAL EDEMA).  Lymphadenopathy:    She has no cervical adenopathy.  Neurological: She is alert and oriented to person, place, and time.  NO FOCAL DEFICITS  Psychiatric: She has a normal mood and affect.  Vitals reviewed.     Assessment & Plan:

## 2015-09-03 NOTE — Interval H&P Note (Signed)
History and Physical Interval Note:  09/03/2015 2:37 PM  Tracie Brooks  has presented today for surgery, with the diagnosis of screening  The various methods of treatment have been discussed with the patient and family. After consideration of risks, benefits and other options for treatment, the patient has consented to  Procedure(s) with comments: COLONOSCOPY (N/A) - 1400 as a surgical intervention .  The patient's history has been reviewed, patient examined, no change in status, stable for surgery.  I have reviewed the patient's chart and labs.  Questions were answered to the patient's satisfaction.     [employer]

## 2015-09-03 NOTE — Discharge Instructions (Signed)
You have small internal hemorrhoids and diverticulosis IN YOUR LEFT COLON. YOU HAD ONE SMALL POLYP REMOVED FROM YOUR Rectum.   DRINK WATER TO KEEP YOUR URINE LIGHT YELLOW.  FOLLOW A HIGH FIBER DIET. AVOID ITEMS THAT CAUSE BLOATING. See info below.  YOUR BIOPSY RESULTS WILL BE AVAILABLE IN MY CHART JUN 8 AND MY OFFICE WILL CONTACT YOU IN 10-14 DAYS WITH YOUR RESULTS.   Next colonoscopy in 5-10 years.  Colonoscopy Care After Read the instructions outlined below and refer to this sheet in the next week. These discharge instructions provide you with general information on caring for yourself after you leave the hospital. While your treatment has been planned according to the most current medical practices available, unavoidable complications occasionally occur. If you have any problems or questions after discharge, call DR. Aria Pickrell, [phone].  ACTIVITY  You may resume your regular activity, but move at a slower pace for the next 24 hours.   Take frequent rest periods for the next 24 hours.   Walking will help get rid of the air and reduce the bloated feeling in your belly (abdomen).   No driving for 24 hours (because of the medicine (anesthesia) used during the test).   You may shower.   Do not sign any important legal documents or operate any machinery for 24 hours (because of the anesthesia used during the test).    NUTRITION  Drink plenty of fluids.   You may resume your normal diet as instructed by your doctor.   Begin with a light meal and progress to your normal diet. Heavy or fried foods are harder to digest and may make you feel sick to your stomach (nauseated).   Avoid alcoholic beverages for 24 hours or as instructed.    MEDICATIONS  You may resume your normal medications.   WHAT YOU CAN EXPECT TODAY  Some feelings of bloating in the abdomen.   Passage of more gas than usual.   Spotting of blood in your stool or on the toilet paper  .  IF YOU HAD POLYPS  REMOVED DURING THE COLONOSCOPY:  Eat a soft diet IF YOU HAVE NAUSEA, BLOATING, ABDOMINAL PAIN, OR VOMITING.    FINDING OUT THE RESULTS OF YOUR TEST Not all test results are available during your visit. DR. Oneida Alar WILL CALL YOU WITHIN 14 DAYS OF YOUR PROCEDUE WITH YOUR RESULTS. Do not assume everything is normal if you have not heard from DR. Adelita Hone, CALL HER OFFICE AT [phone].  SEEK IMMEDIATE MEDICAL ATTENTION AND CALL THE OFFICE: [phone] IF:  You have more than a spotting of blood in your stool.   Your belly is swollen (abdominal distention).   You are nauseated or vomiting.   You have a temperature over 101F.   You have abdominal pain or discomfort that is severe or gets worse throughout the day.  High-Fiber Diet A high-fiber diet changes your normal diet to include more whole grains, legumes, fruits, and vegetables. Changes in the diet involve replacing refined carbohydrates with unrefined foods. The calorie level of the diet is essentially unchanged. The Dietary Reference Intake (recommended amount) for adult males is 38 grams per day. For adult females, it is 25 grams per day. Pregnant and lactating women should consume 28 grams of fiber per day. Fiber is the intact part of a plant that is not broken down during digestion. Functional fiber is fiber that has been isolated from the plant to provide a beneficial effect in the body. PURPOSE  Increase stool  bulk.   Ease and regulate bowel movements.   Lower cholesterol.  REDUCE RISK OF COLON CANCER  INDICATIONS THAT YOU NEED MORE FIBER  Constipation and hemorrhoids.   Uncomplicated diverticulosis (intestine condition) and irritable bowel syndrome.   Weight management.   As a protective measure against hardening of the arteries (atherosclerosis), diabetes, and cancer.   GUIDELINES FOR INCREASING FIBER IN THE DIET  Start adding fiber to the diet slowly. A gradual increase of about 5 more grams (2 slices of  whole-wheat bread, 2 servings of most fruits or vegetables, or 1 bowl of high-fiber cereal) per day is best. Too rapid an increase in fiber may result in constipation, flatulence, and bloating.   Drink enough water and fluids to keep your urine clear or pale yellow. Water, juice, or caffeine-free drinks are recommended. Not drinking enough fluid may cause constipation.   Eat a variety of high-fiber foods rather than one type of fiber.   Try to increase your intake of fiber through using high-fiber foods rather than fiber pills or supplements that contain small amounts of fiber.   The goal is to change the types of food eaten. Do not supplement your present diet with high-fiber foods, but replace foods in your present diet.   INCLUDE A VARIETY OF FIBER SOURCES  Replace refined and processed grains with whole grains, canned fruits with fresh fruits, and incorporate other fiber sources. White rice, white breads, and most bakery goods contain little or no fiber.   Brown whole-grain rice, buckwheat oats, and many fruits and vegetables are all good sources of fiber. These include: broccoli, Brussels sprouts, cabbage, cauliflower, beets, sweet potatoes, white potatoes (skin on), carrots, tomatoes, eggplant, squash, berries, fresh fruits, and dried fruits.   Cereals appear to be the richest source of fiber. Cereal fiber is found in whole grains and bran. Bran is the fiber-rich outer coat of cereal grain, which is largely removed in refining. In whole-grain cereals, the bran remains. In breakfast cereals, the largest amount of fiber is found in those with "bran" in their names. The fiber content is sometimes indicated on the label.   You may need to include additional fruits and vegetables each day.   In baking, for 1 cup white flour, you may use the following substitutions:   1 cup whole-wheat flour minus 2 tablespoons.   1/2 cup white flour plus 1/2 cup whole-wheat flour.   Polyps, Colon  A polyp  is extra tissue that grows inside your body. Colon polyps grow in the large intestine. The large intestine, also called the colon, is part of your digestive system. It is a long, hollow tube at the end of your digestive tract where your body makes and stores stool. Most polyps are not dangerous. They are benign. This means they are not cancerous. But over time, some types of polyps can turn into cancer. Polyps that are smaller than a pea are usually not harmful. But larger polyps could someday become or may already be cancerous. To be safe, doctors remove all polyps and test them.   PREVENTION There is not one sure way to prevent polyps. You might be able to lower your risk of getting them if you:  Eat more fruits and vegetables and less fatty food.   Do not smoke.   Avoid alcohol.   Exercise every day.   Lose weight if you are overweight.   Eating more calcium and folate can also lower your risk of getting polyps. Some foods that  are rich in calcium are milk, cheese, and broccoli. Some foods that are rich in folate are chickpeas, kidney beans, and spinach.    Diverticulosis Diverticulosis is a common condition that develops when small pouches (diverticula) form in the wall of the colon. The risk of diverticulosis increases with age. It happens more often in people who eat a low-fiber diet. Most individuals with diverticulosis have no symptoms. Those individuals with symptoms usually experience belly (abdominal) pain, constipation, or loose stools (diarrhea).  HOME CARE INSTRUCTIONS  Increase the amount of fiber in your diet as directed by your caregiver or dietician. This may reduce symptoms of diverticulosis.   Drink at least 6 to 8 glasses of water each day to prevent constipation.   Try not to strain when you have a bowel movement.   Avoiding nuts and seeds to prevent complications is NOT NECESSARY.    FOODS HAVING HIGH FIBER CONTENT INCLUDE:  Fruits. Apple, peach, pear,  tangerine, raisins, prunes.   Vegetables. Brussels sprouts, asparagus, broccoli, cabbage, carrot, cauliflower, romaine lettuce, spinach, summer squash, tomato, winter squash, zucchini.   Starchy Vegetables. Baked beans, kidney beans, lima beans, split peas, lentils, potatoes (with skin).   Grains. Whole wheat bread, brown rice, bran flake cereal, plain oatmeal, white rice, shredded wheat, bran muffins.    SEEK IMMEDIATE MEDICAL CARE IF:  You develop increasing pain or severe bloating.   You have an oral temperature above 101F.   You develop vomiting or bowel movements that are bloody or black.

## 2015-09-04 ENCOUNTER — Ambulatory Visit (INDEPENDENT_AMBULATORY_CARE_PROVIDER_SITE_OTHER): Payer: Medicare Other | Admitting: "Endocrinology

## 2015-09-04 ENCOUNTER — Encounter: Payer: Self-pay | Admitting: "Endocrinology

## 2015-09-04 MED ORDER — AMLODIPINE BESYLATE 10 MG PO TABS: 10.0000 mg | ORAL_TABLET | Freq: Every day | ORAL | Status: DC

## 2015-09-04 NOTE — Progress Notes (Signed)
Subjective:    Patient ID: Tracie Brooks, female    DOB: [birth year], PCP Mickie Hillier, MD   Past Medical History  Diagnosis Date  . Hypertension   . Asthma   . Dyslipidemia   . Chest pain   . Hyperlipidemia   . IBS (irritable bowel syndrome)   . Allergy    Past Surgical History  Procedure Laterality Date  . Abdominal hysterectomy    . Colonoscopy    . Knee arthroscopy      right 1982  . Biopsy breast  1985   Social History   Social History  . Marital Status: Married    Spouse Name: N/A  . Number of Children: N/A  . Years of Education: N/A   Occupational History  . Retired    Social History Main Topics  . Smoking status: Never Smoker   . Smokeless tobacco: None  . Alcohol Use: No  . Drug Use: No  . Sexual Activity: Not Asked   Other Topics Concern  . 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   No regular exercise   Outpatient Encounter Prescriptions as of 09/04/2015  Medication Sig  . amLODipine (NORVASC) 10 MG tablet Take 1 tablet (10 mg total) by mouth daily.  Marland Kitchen b complex vitamins tablet Take 1 tablet by mouth daily.  . Cyanocobalamin (VITAMIN B 12 PO) Take 1 tablet by mouth daily.  Marland Kitchen FLOVENT HFA 220 MCG/ACT inhaler INHALE 2 PUFFS INTO THE LUNGS 2 (TWO) TIMES DAILY. (Patient taking differently: INHALE 2 PUFFS INTO THE LUNGS 2 (TWO) TIMES DAILY AS NEEDED FOR ALLERGIES.)  . milk thistle 175 MG tablet Take 175 mg by mouth daily.  . Probiotic Product (PROBIOTIC DAILY PO) Take 3 capsules by mouth daily.   Marland Kitchen SIBERIAN GINSENG PO Take 1 capsule by mouth daily.  . VENTOLIN HFA 108 (90 BASE) MCG/ACT inhaler USE 2 PUFFS EVERY 4 TO 6 HOURS AS NEEDED (Patient taking differently: USE 2 PUFFS EVERY 4 TO 6 HOURS AS NEEDED FOR SHORTNESS OF BREATH/WHEEZING.)  . [DISCONTINUED] hydrochlorothiazide (HYDRODIURIL) 25 MG tablet Take 1 tablet (25 mg total) by mouth every morning.   No facility-administered encounter medications on file as  of 09/04/2015.   ALLERGIES: Allergies  Allergen Reactions  . Beta Adrenergic Blockers     swelling  . Ibuprofen   . Levsin [Hyoscyamine Sulfate]     Blurred vision  . Lidocaine     cream  . Pravastatin     Joint pain  . Prednisone   . Sulfa Antibiotics   . Zocor [Simvastatin]     Joint pain   VACCINATION STATUS: Immunization History  Administered Date(s) Administered  . Influenza-Unspecified 01/10/2009  . Zoster 04/12/2007    HPI 70 year old female patient with medical history as above. She is being seen in consultation for hypercalcemia requested by Dr. Mickie Hillier. She was found to have calcium of 11 g/dL associated with PTH of 44 on 06/02/2015. On subsequent labs her calcium was normalized at 9.8. On further interview she admits that she has taken over-the-counter vitamin D in large doses starting from December 2016. She denies any history of nephrolithiasis, osteoporosis, tachyarrhythmias. -She denies family history of pituitary, adrenal, nor parathyroid dysfunction. -She is currently on hydrochlorothiazide 25 mg for blood pressure control. She has family history of osteoporosis in her mother. She denies history of smoking, alcohol use/abuse, nor exposure to heavy dose steroids. -He has used steroid-based inhalers related to her  history of asthma.  Review of Systems  Constitutional:  She has steady weight , no fatigue, no subjective hyperthermia/hypothermia Eyes: no blurry vision, no xerophthalmia ENT: no sore throat, no nodules palpated in throat, no dysphagia/odynophagia, no hoarseness Cardiovascular: no CP/SOB/palpitations/leg swelling Respiratory: no cough/SOB Gastrointestinal: no N/V/D/C Musculoskeletal: no muscle/joint aches Skin: no rashes Neurological: no tremors/numbness/tingling/dizziness Psychiatric: no depression/anxiety   Objective:    BP 136/84 mmHg  Pulse 70  Ht _0  (1.676 m)  Wt 212 lb (96.163 kg)  BMI 34.23 kg/m2  Wt Readings from Last 3  Encounters:  09/04/15 212 lb (96.163 kg)  09/03/15 215 lb (97.523 kg)  08/29/15 215 lb 6.4 oz (97.705 kg)    Physical Exam  Constitutional: overweight, in NAD Eyes: PERRLA, EOMI, no exophthalmos ENT: moist mucous membranes, no thyromegaly, no cervical lymphadenopathy Cardiovascular: RRR, No MRG Respiratory: CTA B Gastrointestinal: abdomen soft, NT, ND, BS+ Musculoskeletal: no deformities, strength intact in all 4 Skin: moist, warm, no rashes Neurological: no tremor with outstretched hands, DTR normal in all 4  Recent Results (from the past 2160 hour(s))  COMPLETE METABOLIC PANEL WITH GFR     Status: Abnormal   Collection Time: 08/29/15 10:21 AM  Result Value Ref Range   Sodium 143 135 - 146 mmol/L   Potassium 4.0 3.5 - 5.3 mmol/L   Chloride 106 98 - 110 mmol/L   CO2 29 20 - 31 mmol/L   Glucose, Bld 115 (H) 65 - 99 mg/dL   BUN 19 7 - 25 mg/dL   Creat 0.94 0.50 - 0.99 mg/dL   Total Bilirubin 0.4 0.2 - 1.2 mg/dL   Alkaline Phosphatase 109 33 - 130 U/L   AST 32 10 - 35 U/L   ALT 38 (H) 6 - 29 U/L   Total Protein 6.2 6.1 - 8.1 g/dL   Albumin 3.8 3.6 - 5.1 g/dL   Calcium 9.8 8.6 - 10.4 mg/dL   GFR, Est African American 72 >=60 mL/min   GFR, Est Non African American 62 >=60 mL/min    Comment:   The estimated GFR is a calculation valid for adults (>=62 years old) that uses the CKD-EPI algorithm to adjust for age and sex. It is   not to be used for children, pregnant women, hospitalized patients,    patients on dialysis, or with rapidly changing kidney function. According to the NKDEP, eGFR >89 is normal, 60-89 shows mild impairment, 30-59 shows moderate impairment, 15-29 shows severe impairment and <15 is ESRD.        Results for CHIMERE, KLINGENSMITH (MRN [id number]) as of 09/04/2015 11:41  Ref. Range 06/24/2012 16:21 05/06/2014 09:21 06/02/2015 08:35 06/02/2015 08:35 08/29/2015 10:21  Glucose Latest Ref Range: 65-99 mg/dL 94 97 103 (H)  115 (H)  PTH Unknown   44 Comment      Assessment  & Plan:   1. Hypercalcemia -She has had elevated calcium of 11 mg/dL in March 2017 associated with appropriately normal PTH of 44. -Her most recent labs showed calcium normal at 9.8. -Etiology of hypercalcemia is not clear as of yet. - She will need more studies to determine the cause of hypercalcemia. I approached her to stop HCTZ and use amlodipine to control hypertension. - She can be switched back to hydrochlorothiazide if it is determined to  not be the cause of hypercalcemia. - I have advised her to avoid over-the-counter calcium/vitamin D for now. -I will repeat PTH/calcium, 24 hour urine calcium, phosphorus, and magnesium in 8 weeks. -I will  also obtain bone density using DEXA. She did have DEXA in 2005 which was normal. -If she is confirmed to have primary hyperparathyroidism causing hypercalcemia, she is a surgical candidate. -It is important to rule out a rare condition of FH H (familial hypocalciuric hypercalcemia) which would not require any therapy. The 24 hour urine calcium will help distinguish between [place] and primary hyperparathyroidism   - I advised patient to maintain close follow up with Mickie Hillier, MD for primary care needs. Follow up plan: Return in about 3 months (around 12/05/2015) for follow up with pre-visit labs.  Glade Lloyd, MD Phone: [phone]  Fax: [phone]   09/04/2015, 11:39 AM

## 2015-09-10 NOTE — Op Note (Signed)
[facility] Patient Name: Tracie Brooks Procedure Date: 09/03/2015 2:33 PM MRN: [id number] Date of Birth: Jul 29, 1945 Attending MD: Barney Drain , MD CSN: [id number] Age: 70 Admit Type: Outpatient Procedure:                Colonoscopy with cold forceps polypectomy Indications:              Screening for colorectal malignant neoplasm Providers:                Barney Drain, MD, Rosina Lowenstein, RN, Randa Spike,                            Technician Referring MD:             Rosemary Holms, MD Medicines:                Meperidine 75 mg IV, Midazolam 5 mg IV Complications:            No immediate complications. Estimated Blood Loss:     Estimated blood loss was minimal. Procedure:                Pre-Anesthesia Assessment:                           - Prior to the procedure, a History and Physical                            was performed, and patient medications and                            allergies were reviewed. The patient's tolerance of                            previous anesthesia was also reviewed. The risks                            and benefits of the procedure and the sedation                            options and risks were discussed with the patient.                            All questions were answered, and informed consent                            was obtained. Prior Anticoagulants: The patient has                            taken no previous anticoagulant or antiplatelet                            agents. ASA Grade Assessment: II - A patient with                            mild systemic disease. After reviewing the risks  and benefits, the patient was deemed in                            satisfactory condition to undergo the procedure.                           After obtaining informed consent, the colonoscope                            was passed under direct vision. Throughout the                            procedure, the patient's blood  pressure, pulse, and                            oxygen saturations were monitored continuously.The                            ileocecal valve, appendiceal orifice, and rectum                            were photographed. The colonoscopy was performed                            without difficulty. The patient tolerated the                            procedure well. The quality of the bowel                            preparation was excellent. The [id number] ([id number])                            scope was introduced through the anus and advanced                            to the the cecum, identified by appendiceal orifice                            and ileocecal valve. Scope In: 2:51:01 PM Scope Out: 3:06:14 PM Scope Withdrawal Time: 0 hours 11 minutes 50 seconds  Total Procedure Duration: 0 hours 15 minutes 13 seconds  Findings:      Many small and large-mouthed diverticula were found in the sigmoid colon.      A 4 mm polyp was found in the rectum. The polyp was sessile. The polyp       was removed with a cold biopsy forceps. Resection and retrieval were       complete.      Non-bleeding internal hemorrhoids were found. The hemorrhoids were small. Impression:               - MODERATE Diverticulosis in the sigmoid colon.                           - One polyp in the rectum, removed                           -  Non-bleeding internal hemorrhoids. Moderate Sedation:      Moderate (conscious) sedation was administered by the endoscopy nurse       and supervised by the endoscopist. The following parameters were       monitored: oxygen saturation, heart rate, blood pressure, and response       to care. Total physician intraservice time was 26 minutes. Recommendation:           - High fiber diet.                           - Continue present medications.                           - Await pathology results.                           - Repeat colonoscopy in 5-10 years for surveillance.                            - Patient has a contact number available for                            emergencies. The signs and symptoms of potential                            delayed complications were discussed with the                            patient. Return to normal activities tomorrow.                            Written discharge instructions were provided to the                            patient. Procedure Code(s):        --- Professional ---                           [phone], Moderate sedation services provided by the                            same physician or other qualified health care                            professional performing the diagnostic or                            therapeutic service that the sedation supports,                            requiring the presence of an independent trained                            observer to assist in the monitoring of the  patient's level of consciousness and physiological                            status; initial 15 minutes of intraservice time,                            patient age 51 years or older                           [phone], Moderate sedation services; each additional                            15 minutes intraservice time Diagnosis Code(s):        --- Professional ---                           Z12.11, Encounter for screening for malignant                            neoplasm of colon                           K64.8, Other hemorrhoids                           K62.1, Rectal polyp                           K57.30, Diverticulosis of large intestine without                            perforation or abscess without bleeding CPT copyright 2016 American Medical Association. All rights reserved. The codes documented in this report are preliminary and upon coder review may  be revised to meet current compliance requirements. Barney Drain, MD Barney Drain, MD 09/10/2015 9:26:08 AM This report has been signed  electronically. Number of Addenda: 0

## 2015-09-13 ENCOUNTER — Encounter (HOSPITAL_COMMUNITY): Payer: Self-pay | Admitting: Gastroenterology

## 2015-09-20 ENCOUNTER — Telehealth: Payer: Self-pay | Admitting: Gastroenterology

## 2015-09-20 NOTE — Telephone Encounter (Signed)
Pt is aware of results. 

## 2015-09-20 NOTE — Telephone Encounter (Signed)
Please call pt. She had ONE HYPERPLASTIC POLYP removed.   DRINK WATER TO KEEP YOUR URINE LIGHT YELLOW.  FOLLOW A HIGH FIBER DIET. AVOID ITEMS THAT CAUSE BLOATING.   Next colonoscopy in 10 years.

## 2015-09-20 NOTE — Telephone Encounter (Signed)
Reminder in epic °

## 2015-10-03 ENCOUNTER — Telehealth: Payer: Self-pay

## 2015-10-03 NOTE — Telephone Encounter (Signed)
Ok, she still needs the tests ordered.

## 2015-10-03 NOTE — Telephone Encounter (Signed)
Pt called to let us know that she will no longer take the Amlodipine which was prescribed at her last visit. She stopped the medication due to severe swelling and restarted the HCTZ.

## 2015-10-09 NOTE — Telephone Encounter (Signed)
Attempted to call pt several times. Awaiting return call.

## 2015-10-30 ENCOUNTER — Encounter: Payer: Self-pay | Admitting: Gastroenterology

## 2015-11-07 ENCOUNTER — Ambulatory Visit: Payer: Medicare Other | Admitting: Family Medicine

## 2015-11-27 ENCOUNTER — Other Ambulatory Visit: Payer: Self-pay | Admitting: "Endocrinology

## 2015-11-27 DIAGNOSIS — Z780 Asymptomatic menopausal state: Secondary | ICD-10-CM

## 2015-12-10 ENCOUNTER — Ambulatory Visit (HOSPITAL_COMMUNITY)
Admission: RE | Admit: 2015-12-10 | Discharge: 2015-12-10 | Disposition: A | Discharge status: Home / Self Care | Payer: Medicare Other | Source: Ambulatory Visit | Attending: "Endocrinology | Admitting: "Endocrinology

## 2015-12-10 ENCOUNTER — Other Ambulatory Visit (HOSPITAL_COMMUNITY): Payer: Medicare Other

## 2015-12-10 DIAGNOSIS — Z13820 Encounter for screening for osteoporosis: Secondary | ICD-10-CM | POA: Diagnosis not present

## 2015-12-10 DIAGNOSIS — Z780 Asymptomatic menopausal state: Secondary | ICD-10-CM | POA: Insufficient documentation

## 2015-12-10 DIAGNOSIS — M85851 Other specified disorders of bone density and structure, right thigh: Secondary | ICD-10-CM | POA: Diagnosis not present

## 2015-12-13 ENCOUNTER — Ambulatory Visit: Payer: Medicare Other | Admitting: "Endocrinology

## 2015-12-13 ENCOUNTER — Other Ambulatory Visit: Payer: Self-pay | Admitting: "Endocrinology

## 2015-12-13 LAB — MAGNESIUM: Magnesium: 2.2 mg/dL (ref 1.5–2.5)

## 2015-12-13 LAB — PHOSPHORUS: PHOSPHORUS: 3.3 mg/dL (ref 2.1–4.3)

## 2015-12-14 LAB — PTH, INTACT AND CALCIUM
Calcium: 10.2 mg/dL (ref 8.6–10.4)
PTH: 47 pg/mL (ref 14–64)

## 2015-12-14 LAB — VITAMIN D 25 HYDROXY (VIT D DEFICIENCY, FRACTURES): VIT D 25 HYDROXY: 57 ng/mL (ref 30–100)

## 2015-12-15 ENCOUNTER — Other Ambulatory Visit: Payer: Self-pay | Admitting: "Endocrinology

## 2015-12-15 LAB — CALCIUM, URINE, 24 HOUR
CALCIUM 24HR UR: 175 mg/(24.h) (ref 35–250)
Calcium, Ur: 7 mg/dL

## 2015-12-28 ENCOUNTER — Ambulatory Visit (INDEPENDENT_AMBULATORY_CARE_PROVIDER_SITE_OTHER): Payer: Medicare Other | Admitting: "Endocrinology

## 2015-12-28 ENCOUNTER — Encounter: Payer: Self-pay | Admitting: "Endocrinology

## 2015-12-28 DIAGNOSIS — M8580 Other specified disorders of bone density and structure, unspecified site: Secondary | ICD-10-CM | POA: Diagnosis not present

## 2015-12-28 DIAGNOSIS — I10 Essential (primary) hypertension: Secondary | ICD-10-CM

## 2015-12-28 NOTE — Progress Notes (Signed)
Subjective:    Patient ID: Tracie Brooks, female    DOB: 1946-02-18, PCP Mickie Hillier, MD   Past Medical History:  Diagnosis Date  . Allergy   . Asthma   . Chest pain   . Dyslipidemia   . Hyperlipidemia   . Hypertension   . IBS (irritable bowel syndrome)    Past Surgical History:  Procedure Laterality Date  . ABDOMINAL HYSTERECTOMY    . BIOPSY BREAST  1985  . COLONOSCOPY    . COLONOSCOPY N/A 09/03/2015   Procedure: COLONOSCOPY;  Surgeon: Danie Binder, MD;  Location: [facility];  Service: Endoscopy;  Laterality: N/A;  1400  . KNEE ARTHROSCOPY     right 1982   Social History   Social History  . Marital status: Married    Spouse name: N/A  . Number of children: N/A  . Years of education: N/A   Occupational History  . Retired    Social History Main Topics  . Smoking status: Never Smoker  . Smokeless tobacco: Never Used  . Alcohol use No  . Drug use: No  . Sexual activity: Not Asked   Other Topics Concern  . 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   No regular exercise   Outpatient Encounter Prescriptions as of 12/28/2015  Medication Sig  . Pine Bark Extract 95 % POWD by Does not apply route daily.  Marland Kitchen UNABLE TO FIND daily. [place] Astaxanthin  . amLODipine (NORVASC) 10 MG tablet Take 1 tablet (10 mg total) by mouth daily.  Marland Kitchen b complex vitamins tablet Take 1 tablet by mouth daily.  . Cyanocobalamin (VITAMIN B 12 PO) Take 1 tablet by mouth daily.  Marland Kitchen FLOVENT HFA 220 MCG/ACT inhaler INHALE 2 PUFFS INTO THE LUNGS 2 (TWO) TIMES DAILY. (Patient taking differently: INHALE 2 PUFFS INTO THE LUNGS 2 (TWO) TIMES DAILY AS NEEDED FOR ALLERGIES.)  . milk thistle 175 MG tablet Take 175 mg by mouth daily.  . Probiotic Product (PROBIOTIC DAILY PO) Take 3 capsules by mouth daily.   Marland Kitchen SIBERIAN GINSENG PO Take 1 capsule by mouth daily.  . VENTOLIN HFA 108 (90 BASE) MCG/ACT inhaler USE 2 PUFFS EVERY 4 TO 6 HOURS AS NEEDED (Patient  taking differently: USE 2 PUFFS EVERY 4 TO 6 HOURS AS NEEDED FOR SHORTNESS OF BREATH/WHEEZING.)   No facility-administered encounter medications on file as of 12/28/2015.    ALLERGIES: Allergies  Allergen Reactions  . Beta Adrenergic Blockers     swelling  . Ibuprofen   . Levsin [Hyoscyamine Sulfate]     Blurred vision  . Lidocaine     cream  . Pravastatin     Joint pain  . Prednisone   . Sulfa Antibiotics   . Zocor [Simvastatin]     Joint pain   VACCINATION STATUS: Immunization History  Administered Date(s) Administered  . Influenza-Unspecified 01/10/2009  . Zoster 04/12/2007    HPI 70 year old female patient with medical history as above. She is being seen in Follow-up for hypercalcemia. She was found to have calcium of 11 g/dL associated with PTH of 44 on 06/02/2015. On subsequent labs her calcium was normalized at 9.8. On further interview she admits that she has taken over-the-counter vitamin D in large doses starting from December 2016. - Her repeat labs on 12/13/2015 show calcium 10.2, PTH 47, phosphorus 3.3, and magnesium 2.2 and her vitamin D level was 57. She denies any history of nephrolithiasis, osteoporosis, tachyarrhythmias. -  Her bone density on 12/10/2015 showed osteopenia on bilateral femur. She insists that she never had bone density in the past although there is one in her records from 2005 for a patient with the same name and same date of birth. -She denies family history of pituitary, adrenal, nor parathyroid dysfunction. -She is currently off of  hydrochlorothiazide for blood pressure control.  - She did not tolerate it amlodipine replacement, and her blood pressure remains controlled without medications.   She has family history of osteoporosis in her mother. She denies history of smoking, alcohol use/abuse, nor exposure to heavy dose steroids. -He has used steroid-based inhalers related to her history of asthma.  Review of Systems  Constitutional:   She has steady weight , no fatigue, no subjective hyperthermia/hypothermia Eyes: no blurry vision, no xerophthalmia ENT: no sore throat, no nodules palpated in throat, no dysphagia/odynophagia, no hoarseness Cardiovascular: no CP/SOB/palpitations/leg swelling Respiratory: no cough/SOB Gastrointestinal: no N/V/D/C Musculoskeletal: no muscle/joint aches Skin: no rashes Neurological: no tremors/numbness/tingling/dizziness Psychiatric: no depression/anxiety   Objective:    BP 135/84   Pulse 67   Ht 5' 6"  (1.676 m)   Wt 206 lb (93.4 kg)   BMI 33.25 kg/m   Wt Readings from Last 3 Encounters:  12/28/15 206 lb (93.4 kg)  09/04/15 212 lb (96.2 kg)  09/03/15 215 lb (97.5 kg)    Physical Exam  Constitutional: overweight, in NAD Eyes: PERRLA, EOMI, no exophthalmos ENT: moist mucous membranes, no thyromegaly, no cervical lymphadenopathy Cardiovascular: RRR, No MRG Respiratory: CTA B Gastrointestinal: abdomen soft, NT, ND, BS+ Musculoskeletal: no deformities, strength intact in all 4 Skin: moist, warm, no rashes Neurological: no tremor with outstretched hands, DTR normal in all 4  Recent Results (from the past 2160 hour(s))  Phosphorus     Status: None   Collection Time: 12/13/15  9:21 AM  Result Value Ref Range   Phosphorus 3.3 2.1 - 4.3 mg/dL  Magnesium     Status: None   Collection Time: 12/13/15  9:21 AM  Result Value Ref Range   Magnesium 2.2 1.5 - 2.5 mg/dL  PTH, intact and calcium     Status: None   Collection Time: 12/13/15  9:21 AM  Result Value Ref Range   PTH 47 14 - 64 pg/mL   Calcium 10.2 8.6 - 10.4 mg/dL    Comment:   Interpretive Guide:                              Intact PTH               Calcium                              ----------               ------- Normal Parathyroid           Normal                   Normal Hypoparathyroidism           Low or Low Normal        Low Hyperparathyroidism      Primary                 Normal or High           High       Secondary  High                     Normal or Low      Tertiary                High                     High Non-Parathyroid   Hypercalcemia              Low or Low Normal        High   VITAMIN D 25 Hydroxy (Vit-D Deficiency, Fractures)     Status: None   Collection Time: 12/13/15  9:21 AM  Result Value Ref Range   Vit D, 25-Hydroxy 57 30 - 100 ng/mL    Comment: Vitamin D Status           25-OH Vitamin D        Deficiency                <20 ng/mL        Insufficiency         20 - 29 ng/mL        Optimal             > or = 30 ng/mL   For 25-OH Vitamin D testing on patients on D2-supplementation and patients for whom quantitation of D2 and D3 fractions is required, the QuestAssure[facility] 25-OH VIT D, (D2,D3), LC/MS/MS is recommended: order code [id number] (patients > 2 yrs).   Calcium, urine, 24 hour     Status: None   Collection Time: 12/15/15 11:27 AM  Result Value Ref Range   Calcium, Ur 7 Not estab mg/dL   Calcium, 24 hour urine 175 35 - 250 mg/24 h    Comment:                                   Reference Range   35-250                                   Low calcium diet  35-200          Assessment & Plan:   1. Hypercalcemia- resolved 2. Osteopenia 3. Hypertension  - Her repeat labs show normal calcium of 10.2 along with PTH of 47. - Her 24 hour urine calcium showed calcium 175 (normal 35-250). - These sets of findings rule out primary hyperparathyroidism as a cause of hypercalcemia for now. - She will not  need surgical therapy at this time. - I have advised her to stay away from over-the-counter calcium supplements, stay away from large doses vitamin D supplements as her 25 hydroxy vitamin D is 57 considered sufficient.  - she insists that she needs vitamin D, however I advised her to limited to over-the-counter vitamin D 1000 units daily only during the winter months.  - She can be switched back to hydrochlorothiazide  if she requires therapy for hypertension.   -  Her bone density showed osteopenia on bilateral femur.  - She would not require bisphosphonate therapy for now.  - She insists that the DEXA study in 2005 for patient by the same name and same date of birth does not belong to her.  - I advised her to increase her exercise by walking at least 3 miles every  day.  -He'll have repeat PTH/calcium in 6 months with office visit.    - I advised patient to maintain close follow up with Mickie Hillier, MD for primary care needs. Follow up plan: Return in about 6 months (around 06/26/2016) for follow up with pre-visit labs.  Glade Lloyd, MD Phone: [phone]  Fax: [phone]   12/28/2015, 10:34 AM

## 2015-12-31 ENCOUNTER — Other Ambulatory Visit: Payer: Self-pay | Admitting: Obstetrics & Gynecology

## 2015-12-31 DIAGNOSIS — Z1231 Encounter for screening mammogram for malignant neoplasm of breast: Secondary | ICD-10-CM

## 2016-01-09 ENCOUNTER — Ambulatory Visit (HOSPITAL_COMMUNITY)
Admission: RE | Admit: 2016-01-09 | Discharge: 2016-01-09 | Disposition: A | Discharge status: Home / Self Care | Payer: Medicare Other | Source: Ambulatory Visit | Attending: Obstetrics & Gynecology | Admitting: Obstetrics & Gynecology

## 2016-01-09 DIAGNOSIS — Z1231 Encounter for screening mammogram for malignant neoplasm of breast: Secondary | ICD-10-CM | POA: Insufficient documentation

## 2016-01-10 ENCOUNTER — Other Ambulatory Visit: Payer: Self-pay

## 2016-01-10 ENCOUNTER — Telehealth: Payer: Self-pay | Admitting: Gastroenterology

## 2016-01-10 DIAGNOSIS — R945 Abnormal results of liver function studies: Secondary | ICD-10-CM

## 2016-01-10 DIAGNOSIS — R7989 Other specified abnormal findings of blood chemistry: Secondary | ICD-10-CM

## 2016-01-10 NOTE — Telephone Encounter (Signed)
NOV RECALL FOR LIVER PANEL

## 2016-01-14 ENCOUNTER — Ambulatory Visit (INDEPENDENT_AMBULATORY_CARE_PROVIDER_SITE_OTHER): Payer: Medicare Other | Admitting: Family Medicine

## 2016-01-14 ENCOUNTER — Encounter: Payer: Self-pay | Admitting: Family Medicine

## 2016-01-14 VITALS — BP 128/78 | Ht 66.0 in | Wt 203.4 lb

## 2016-01-14 DIAGNOSIS — J45909 Unspecified asthma, uncomplicated: Secondary | ICD-10-CM | POA: Diagnosis not present

## 2016-01-14 DIAGNOSIS — M8580 Other specified disorders of bone density and structure, unspecified site: Secondary | ICD-10-CM

## 2016-01-14 DIAGNOSIS — I10 Essential (primary) hypertension: Secondary | ICD-10-CM

## 2016-01-14 DIAGNOSIS — J4520 Mild intermittent asthma, uncomplicated: Secondary | ICD-10-CM

## 2016-01-14 MED ORDER — ALBUTEROL SULFATE HFA 108 (90 BASE) MCG/ACT IN AERS: INHALATION_SPRAY | RESPIRATORY_TRACT | 2 refills | Status: DC

## 2016-01-14 MED ORDER — FLUTICASONE PROPIONATE HFA 220 MCG/ACT IN AERO: INHALATION_SPRAY | RESPIRATORY_TRACT | 2 refills | Status: DC

## 2016-01-14 NOTE — Progress Notes (Signed)
   Subjective:    Patient ID: Tracie Brooks, female    DOB: 1945-04-29, 70 y.o.   MRN: [id number] Patient presents for follow-up of numerous concerns. Hypertension  This is a chronic problem. The current episode started more than 1 year ago. Risk factors for coronary artery disease include post-menopausal state. Treatments tried: patient stopped norvasc due to side effects.   Patient reports problems with allergies and needs refills of her inhalers. Uses the inhaler when necessary. Not interested in steroid inhalers for prevention.  Notes had low bone density tes, with somewhat thin bones, hctz has dropped , and now ptassium better. Patient went through a very lengthy discussion to report that a false bone density test have been placed on her chart somehow. There was a bone density test from 12 years ago which the patient claims she never had. We went back out the paper chart. Refreshed her memory on this. Results from then were reviewed and compared to today.   Pt uses inhaler prn and it helps sig  Walking at lest five d per wk, 40 to 43 min.   Given norvasc and di not help and caused swelling    Walking two miles per day   Review of Systems No headache, no major weight loss or weight gain, no chest pain no back pain abdominal pain no change in bowel habits complete ROS otherwise negative     Objective:   Physical Exam Alert vitals stable, NAD. Blood pressure good on repeat. HEENT normal. Lungs clear. Heart regular rate and rhythm.        Assessment & Plan:  Impression 1 hypertension good control on current meds discussed maintain same #2 osteopenia discuss patient to track this again in several years with another bone density test. #3 old bone density test confirm that this indeed did belong to patient #4asthma clinically stable patient uses inhaler frequent enough to warrant preventative inhaler but does not want one. Medications refilled. Diet exercise discussed. 25 minutes spent  most in discussion. Follow-up in 6 months

## 2016-01-15 ENCOUNTER — Telehealth: Payer: Self-pay | Admitting: Family Medicine

## 2016-01-15 NOTE — Telephone Encounter (Signed)
Patient is requesting Dr. Richardson Landry to call her back regarding a bone density scan from 2005.  She said there was some confusion about whether or not that was actually her scan or not and she wanted to discuss this with Dr. Richardson Landry.

## 2016-01-25 LAB — HEPATIC FUNCTION PANEL
ALBUMIN: 4.3 g/dL (ref 3.6–5.1)
ALK PHOS: 119 U/L (ref 33–130)
ALT: 21 U/L (ref 6–29)
AST: 23 U/L (ref 10–35)
BILIRUBIN TOTAL: 0.5 mg/dL (ref 0.2–1.2)
Bilirubin, Direct: 0.1 mg/dL (ref <=0.2)
Indirect Bilirubin: 0.4 mg/dL (ref 0.2–1.2)
TOTAL PROTEIN: 6.6 g/dL (ref 6.1–8.1)

## 2016-01-27 NOTE — Telephone Encounter (Signed)
PLEASE CALL PT. HER LIVER PANEL IS NORMAL.

## 2016-01-28 NOTE — Telephone Encounter (Signed)
Pt is aware of results. 

## 2016-01-31 ENCOUNTER — Ambulatory Visit (INDEPENDENT_AMBULATORY_CARE_PROVIDER_SITE_OTHER): Payer: Medicare Other | Admitting: Gastroenterology

## 2016-01-31 ENCOUNTER — Encounter: Payer: Self-pay | Admitting: Gastroenterology

## 2016-01-31 DIAGNOSIS — K7581 Nonalcoholic steatohepatitis (NASH): Secondary | ICD-10-CM | POA: Diagnosis not present

## 2016-01-31 NOTE — Assessment & Plan Note (Addendum)
CLINICALLY IMPROVED. LIVER PANEL NORMAL. WEIGHT DOWN 13 LBS.  CONTINUE YOUR WEIGHT LOSS EFFORTS. TRY TO GET BMI < 30. DRINK WATER TO KEEP YOUR URINE LIGHT YELLOW. FOLLOW A HIGH FIBER /LOW FAT DIET. AVOID ITEMS THAT CAUSE BLOATING & GAS. CALL WITH QUESTIONS OR CONCERNS. NOW THAT LIVER ENZYMES ARE NORMAL, WILL CHECK ONCE A YEAR IF WEIGHT REMAINS < 202 LBS. FOLLOW UP IN 6 MOS.   GREATER THAN 50% WAS SPENT IN COUNSELING & COORDINATION OF CARE WITH THE PATIENT: DISCUSSED LABS FROM 2017, BENEFITS, RISKS, AND MANAGEMENT OF NASH. TOTAL ENCOUNTER TIME: 15 MINS.

## 2016-01-31 NOTE — Progress Notes (Signed)
Subjective:    Patient ID: Tracie Brooks, female    DOB: 06-15-1945, 70 y.o.   MRN: [id number]   Mickie Hillier, MD  HPI BEEN EXERCISING AND LOSING WEIGHT. LOOKING FOR A JOB. WAS AN ACCOUNTANT, HR MANAGER, AND FINANCE CONTROLLER.  HAS AN INTERVIEW NEXT WEEK. IT'S A SLOW PROCESS. BMs: REGULAR. LOST 13 LBS. ALLERGIES AND ASTHMA ARE UNDER GOOD CONTROL.   PT DENIES FEVER, CHILLS, HEMATOCHEZIA, nausea, vomiting, melena, diarrhea, CHEST PAIN, SHORTNESS OF BREATH, CHANGE IN BOWEL IN HABITS, constipation, abdominal pain, problems swallowing, OR heartburn or indigestion.  Past Medical History:  Diagnosis Date  . Allergy   . Asthma   . Chest pain   . Dyslipidemia   . Hyperlipidemia   . Hypertension   . IBS (irritable bowel syndrome)     Past Surgical History:  Procedure Laterality Date  . ABDOMINAL HYSTERECTOMY    . BIOPSY BREAST  1985  . COLONOSCOPY    . COLONOSCOPY N/A 09/03/2015   Procedure: COLONOSCOPY;  Surgeon: Danie Binder, MD;  Location: [facility];  Service: Endoscopy;  Laterality: N/A;  1400  . KNEE ARTHROSCOPY     right 1982    Allergies  Allergen Reactions  . Beta Adrenergic Blockers     swelling  . Ibuprofen   . Levsin [Hyoscyamine Sulfate]     Blurred vision  . Lidocaine     cream  . Pravastatin     Joint pain  . Prednisone   . Sulfa Antibiotics   . Zocor [Simvastatin]     Joint pain    Current Outpatient Prescriptions  Medication Sig Dispense Refill  . albuterol (VENTOLIN HFA) 108 (90 Base) MCG/ACT inhaler USE 2 PUFFS EVERY 4 TO 6 HOURS AS NEEDED FOR SHORTNESS OF BREATH/WHEEZING. 18 Inhaler 2  . b complex vitamins tablet Take 1 tablet by mouth daily.    Marland Kitchen co-enzyme Q-10 30 MG capsule Take 30 mg by mouth 3 (three) times daily.    . Cyanocobalamin (VITAMIN B 12 PO) Take 1 tablet by mouth daily.    . fluticasone (FLOVENT HFA) 220 MCG/ACT inhaler INHALE 2 PUFFS INTO THE LUNGS 2 (TWO) TIMES DAILY AS NEEDED FOR ALLERGIES. 12 Inhaler 2  . milk thistle 175 MG  tablet Take 175 mg by mouth daily.    [place] Bark Extract 95 % POWD by Does not apply route daily.    . Probiotic Product (PROBIOTIC DAILY PO) Take 3 capsules by mouth daily.     Marland Kitchen SIBERIAN GINSENG PO Take 1 capsule by mouth daily.    Marland Kitchen UNABLE TO FIND daily. [place] Astaxanthin    .       Review of Systems PER HPI OTHERWISE ALL SYSTEMS ARE NEGATIVE.    Objective:   Physical Exam  Constitutional: She is oriented to person, place, and time. She appears well-developed and well-nourished. No distress.  HENT:  Head: Normocephalic and atraumatic.  Mouth/Throat: Oropharynx is clear and moist. No oropharyngeal exudate.  Eyes: Pupils are equal, round, and reactive to light. No scleral icterus.  Neck: Normal range of motion. Neck supple.  Cardiovascular: Normal rate, regular rhythm and normal heart sounds.   Pulmonary/Chest: Effort normal and breath sounds normal. No respiratory distress.  Abdominal: Soft. Bowel sounds are normal. She exhibits no distension. There is no tenderness.  Musculoskeletal: She exhibits no edema.  Lymphadenopathy:    She has no cervical adenopathy.  Neurological: She is alert and oriented to person, place, and time.  Psychiatric: She  has a normal mood and affect.  Vitals reviewed.     Assessment & Plan:

## 2016-01-31 NOTE — Progress Notes (Signed)
cc'ed to pcp °

## 2016-01-31 NOTE — Patient Instructions (Addendum)
EXCELLENT JOB WITH YOUR WEIGHT LOSS. CONTINUE YOUR WEIGHT LOSS EFFORTS.  DRINK WATER TO KEEP YOUR URINE LIGHT YELLOW.  FOLLOW A HIGH FIBER /LOW FAT DIET. AVOID ITEMS THAT CAUSE BLOATING & GAS.  NOW THAT YOUR LIVER ENZYMES ARE NORMAL, WE WILL CHECK YOUR LIVER PANEL ONCE A YEAR IF YOUR WEIGHT REMAINS < 202 LBS.  FOLLOW UP IN 6 MOS.  PLEASE CALL WITH QUESTIONS OR CONCERNS.  FOLLOW UP IN 6-12 MOS. MERRY CHRISTMAS AND HAPPY NEW YEAR!

## 2016-02-12 ENCOUNTER — Other Ambulatory Visit: Payer: Self-pay

## 2016-02-12 DIAGNOSIS — R7989 Other specified abnormal findings of blood chemistry: Secondary | ICD-10-CM

## 2016-02-12 DIAGNOSIS — R945 Abnormal results of liver function studies: Secondary | ICD-10-CM

## 2016-03-10 ENCOUNTER — Encounter: Payer: Self-pay | Admitting: Family Medicine

## 2016-03-10 ENCOUNTER — Ambulatory Visit (INDEPENDENT_AMBULATORY_CARE_PROVIDER_SITE_OTHER): Payer: Medicare Other | Admitting: Family Medicine

## 2016-03-10 VITALS — BP 142/88 | HR 62 | Temp 97.6°F | Ht 66.0 in | Wt 196.2 lb

## 2016-03-10 DIAGNOSIS — J4520 Mild intermittent asthma, uncomplicated: Secondary | ICD-10-CM | POA: Diagnosis not present

## 2016-03-10 DIAGNOSIS — R21 Rash and other nonspecific skin eruption: Secondary | ICD-10-CM | POA: Diagnosis not present

## 2016-03-10 MED ORDER — PREDNISONE 20 MG PO TABS: ORAL_TABLET | ORAL | 0 refills | Status: DC

## 2016-03-10 NOTE — Progress Notes (Signed)
   Subjective:    Patient ID: Tracie Brooks, female    DOB: Nov 13, 1945, 70 y.o.   MRN: [id number]  HPI   Patient c/o asthma exacerbation.  She c/o nonproductive cough, chest pressure, and difficulty breathing.  She reports no relief from inhalers.  She states symptoms started 12/8 and have worsened. Pt c/o accessory muscle pain, relieved by APAP.  Pt walked on treadmill in base[place]  Exposed to dust  Felt tight after exposure  Productive cough, moved to both inhaler incl rescue  Used both inhalers tid  Soreness and t chest from coughing and form achiness in the joints  couging sldo  Uses albuterol Once per d geernally   Pt c/o rash on hands for 2-3 weeks, worsening, and itches.  She states cortisone cream has not helped, but has used essential oils with cocoanut that have helped some.      Review of Systems No headache, no major weight loss or weight gain, no chest pain no back pain abdominal pain no change in bowel habits complete ROS otherwise negative     Objective:   Physical Exam  Alert vitals stable, NAD. Blood pressure good on repeat. HEENT normal. Lungs  Impressive bilateral wheezes no tachypnea no crackl. Hands dermatitis noted. Heart regular rate and rhythm.       Assessment & Plan:   impression 1 flare of asthma #2 hand dermatitis plan prednisone taper. Rationale discussed. Patient declines steroid cream due to perceived allergy history. Symptom care warning sigs discussed

## 2016-06-23 ENCOUNTER — Other Ambulatory Visit: Payer: Self-pay | Admitting: "Endocrinology

## 2016-06-24 LAB — PTH, INTACT AND CALCIUM
Calcium: 9.9 mg/dL (ref 8.6–10.4)
PTH: 59 pg/mL (ref 14–64)

## 2016-06-26 ENCOUNTER — Encounter: Payer: Self-pay | Admitting: "Endocrinology

## 2016-06-26 ENCOUNTER — Ambulatory Visit (INDEPENDENT_AMBULATORY_CARE_PROVIDER_SITE_OTHER): Payer: Medicare HMO | Admitting: "Endocrinology

## 2016-06-26 DIAGNOSIS — M8580 Other specified disorders of bone density and structure, unspecified site: Secondary | ICD-10-CM | POA: Diagnosis not present

## 2016-06-26 NOTE — Progress Notes (Signed)
Subjective:    Patient ID: Tracie Brooks, female    DOB: [birth year], PCP Mickie Hillier, MD   Past Medical History:  Diagnosis Date  . Allergy   . Asthma   . Chest pain   . Dyslipidemia   . Hyperlipidemia   . Hypertension   . IBS (irritable bowel syndrome)    Past Surgical History:  Procedure Laterality Date  . ABDOMINAL HYSTERECTOMY    . BIOPSY BREAST  1985  . COLONOSCOPY    . COLONOSCOPY N/A 09/03/2015   Procedure: COLONOSCOPY;  Surgeon: Danie Binder, MD;  Location: [facility];  Service: Endoscopy;  Laterality: N/A;  1400  . KNEE ARTHROSCOPY     right 1982   Social History   Social History  . Marital status: Married    Spouse name: N/A  . Number of children: N/A  . Years of education: N/A   Occupational History  . Retired    Social History Main Topics  . Smoking status: Never Smoker  . Smokeless tobacco: Never Used  . Alcohol use No  . Drug use: No  . Sexual activity: Not Asked   Other Topics Concern  . 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   No regular exercise   Outpatient Encounter Prescriptions as of 06/26/2016  Medication Sig  . albuterol (VENTOLIN HFA) 108 (90 Base) MCG/ACT inhaler USE 2 PUFFS EVERY 4 TO 6 HOURS AS NEEDED FOR SHORTNESS OF BREATH/WHEEZING.  Marland Kitchen amLODipine (NORVASC) 10 MG tablet Take 1 tablet (10 mg total) by mouth daily.  Marland Kitchen b complex vitamins tablet Take 1 tablet by mouth daily.  . fluticasone (FLOVENT HFA) 220 MCG/ACT inhaler INHALE 2 PUFFS INTO THE LUNGS 2 (TWO) TIMES DAILY AS NEEDED FOR ALLERGIES.  Marland Kitchen milk thistle 175 MG tablet Take 175 mg by mouth daily.  . predniSONE (DELTASONE) 20 MG tablet 3qd for 3d then 2qd for 3d then 1qd for 2d  . Probiotic Product (PROBIOTIC DAILY PO) Take 3 capsules by mouth daily.   Marland Kitchen SIBERIAN GINSENG PO Take 1 capsule by mouth daily.  Marland Kitchen UNABLE TO FIND daily. [place] Astaxanthin   No facility-administered encounter medications on file as of 06/26/2016.     ALLERGIES: Allergies  Allergen Reactions  . Beta Adrenergic Blockers     swelling  . Ibuprofen   . Levsin [Hyoscyamine Sulfate]     Blurred vision  . Lidocaine     cream  . Pravastatin     Joint pain  . Sulfa Antibiotics   . Zocor [Simvastatin]     Joint pain   VACCINATION STATUS: Immunization History  Administered Date(s) Administered  . Influenza-Unspecified 01/10/2009  . Zoster 04/12/2007    HPI    71 year old female patient with medical history as above. She is being seen in Follow-up for hypercalcemia. She was found to have calcium of 11 g/dL associated with PTH of 44 on 06/02/2015. On subsequent labs her calcium was normalized at 9.8. On further interview she admits that she has taken over-the-counter vitamin D in large doses starting from December 2016. - Her repeat labs on 12/13/2015 show calcium 10.2, PTH 47, phosphorus 3.3, and magnesium 2.2 and her vitamin D level was 57. - Before this visit, her labs showed PTH of 59 and calcium 9.9.  she denies any history of nephrolithiasis,  tachyarrhythmias. - Her bone density on 12/10/2015 showed osteopenia on bilateral femur. -She denies family history of pituitary, adrenal, nor parathyroid dysfunction. -  She is on  hydrochlorothiazide for blood pressure control.    She has family history of osteoporosis in her mother. She denies history of smoking, alcohol use/abuse, nor exposure to heavy dose steroids. -He has used steroid-based inhalers related to her history of asthma.  Review of Systems  Constitutional:  She has steady weight , no fatigue, no subjective hyperthermia/hypothermia Eyes: no blurry vision, no xerophthalmia ENT: no sore throat, no nodules palpated in throat, no dysphagia/odynophagia, no hoarseness Cardiovascular: no CP/SOB/palpitations/leg swelling Respiratory: no cough/SOB Gastrointestinal: no N/V/D/C Musculoskeletal: no muscle/joint aches Skin: no rashes Neurological: no  tremors/numbness/tingling/dizziness Psychiatric: no depression/anxiety   Objective:    BP (!) 150/82   Pulse 90   Ht 5' 6"  (1.676 m)   Wt 200 lb (90.7 kg)   BMI 32.28 kg/m   Wt Readings from Last 3 Encounters:  06/26/16 200 lb (90.7 kg)  03/10/16 196 lb 3.2 oz (89 kg)  01/31/16 202 lb (91.6 kg)    Physical Exam  Constitutional: overweight, in NAD Eyes: PERRLA, EOMI, no exophthalmos ENT: moist mucous membranes, no thyromegaly, no cervical lymphadenopathy Cardiovascular: RRR, No MRG Respiratory: CTA B Gastrointestinal: abdomen soft, NT, ND, BS+ Musculoskeletal: no deformities, strength intact in all 4 Skin: moist, warm, no rashes Neurological: no tremor with outstretched hands, DTR normal in all 4  Recent Results (from the past 2160 hour(s))  PTH, intact and calcium     Status: None   Collection Time: 06/23/16 12:18 PM  Result Value Ref Range   PTH 59 14 - 64 pg/mL   Calcium 9.9 8.6 - 10.4 mg/dL    Comment:   Interpretive Guide:                              Intact PTH               Calcium                              ----------               ------- Normal Parathyroid           Normal                   Normal Hypoparathyroidism           Low or Low Normal        Low Hyperparathyroidism      Primary                 Normal or High           High      Secondary               High                     Normal or Low      Tertiary                High                     High Non-Parathyroid   Hypercalcemia              Low or Low Normal        High        Assessment & Plan:   1. Hypercalcemia- resolved 2. Osteopenia 3. Hypertension  -  Her repeat labs show normal calcium of 9.9 along with PTH of 59. - Her  Prior 24 hour urine calcium showed calcium 175 (normal 35-250). - These sets of findings rule out primary hyperparathyroidism as a cause of hypercalcemia for now. - She will not  need surgical therapy at this time. - I have advised her to stay away from  over-the-counter calcium supplements, stay away from large doses vitamin D supplements as her 25 hydroxy vitamin D is 57 considered sufficient.   - Her bone density showed osteopenia on bilateral femur.  - Her DEXA scan from September 2017 showed osteopenia, needs to repeated in September 2019.  -She will have repeat PTH/calcium in 12 months with office visit.    - I advised patient to maintain close follow up with Mickie Hillier, MD for primary care needs. Follow up plan: Return in about 1 year (around 06/26/2017) for follow up with pre-visit labs.  Glade Lloyd, MD Phone: [phone]  Fax: [phone]   06/26/2016, 10:42 AM

## 2016-07-01 ENCOUNTER — Encounter: Payer: Self-pay | Admitting: Gastroenterology

## 2016-07-14 ENCOUNTER — Encounter: Payer: Self-pay | Admitting: Family Medicine

## 2016-07-14 ENCOUNTER — Ambulatory Visit (INDEPENDENT_AMBULATORY_CARE_PROVIDER_SITE_OTHER): Payer: Medicare HMO | Admitting: Family Medicine

## 2016-07-14 VITALS — BP 158/88 | Ht 66.0 in | Wt 199.0 lb

## 2016-07-14 DIAGNOSIS — IMO0001 Reserved for inherently not codable concepts without codable children: Secondary | ICD-10-CM

## 2016-07-14 DIAGNOSIS — J4520 Mild intermittent asthma, uncomplicated: Secondary | ICD-10-CM | POA: Diagnosis not present

## 2016-07-14 NOTE — Progress Notes (Signed)
   Subjective:    Patient ID: Tracie Brooks, female    DOB: 08/25/1945, 71 y.o.   MRN: [id number]  Hypertension  This is a chronic problem. Compliance problems: eats healthy, walks 2 -3 miles a day,  and [place]   pt states she has not taken any blood presssure meds in years.   Exercising regullay   Not on any bp meds current;y  Still deling with eczema , overall Pt states no concerns today.   Trying not to use the inhaler ref   Stressful time pt is experiencing issues with hving more stress  Wheezing overall good control     Review of Systems No headache, no major weight loss or weight gain, no chest pain no back pain abdominal pain no change in bowel habits complete ROS otherwise negative     Objective:   Physical Exam  Alert vitals stable, NAD. Blood pressure good on repeat. HEENT normal. Lungs clear. Heart regular rate and rhythm.       Assessment & Plan:  Impression hypertension with patient resistant to medication. In the past she remarkably developed side effects to 3 different medications I had her on all within the course of several weeks. She does not want to take any medicine #2 asthma well-controlled with daily steroid inhaler. Rare use of albuterol continue same management. Patient declines all vaccines check yearly continue to follow-up with Dr. Dorris Fetch

## 2016-08-12 DIAGNOSIS — Z79899 Other long term (current) drug therapy: Secondary | ICD-10-CM | POA: Diagnosis not present

## 2016-08-12 DIAGNOSIS — I10 Essential (primary) hypertension: Secondary | ICD-10-CM | POA: Diagnosis not present

## 2016-08-12 DIAGNOSIS — R609 Edema, unspecified: Secondary | ICD-10-CM | POA: Diagnosis not present

## 2016-08-12 DIAGNOSIS — J45909 Unspecified asthma, uncomplicated: Secondary | ICD-10-CM | POA: Diagnosis not present

## 2016-08-12 DIAGNOSIS — Z0000 Encounter for general adult medical examination without abnormal findings: Secondary | ICD-10-CM | POA: Diagnosis not present

## 2016-08-12 DIAGNOSIS — Z6832 Body mass index (BMI) 32.0-32.9, adult: Secondary | ICD-10-CM | POA: Diagnosis not present

## 2016-08-12 DIAGNOSIS — E669 Obesity, unspecified: Secondary | ICD-10-CM | POA: Diagnosis not present

## 2016-08-13 DIAGNOSIS — H35042 Retinal micro-aneurysms, unspecified, left eye: Secondary | ICD-10-CM | POA: Diagnosis not present

## 2016-08-13 DIAGNOSIS — H04123 Dry eye syndrome of bilateral lacrimal glands: Secondary | ICD-10-CM | POA: Diagnosis not present

## 2016-08-13 DIAGNOSIS — H2513 Age-related nuclear cataract, bilateral: Secondary | ICD-10-CM | POA: Diagnosis not present

## 2016-08-13 DIAGNOSIS — H35371 Puckering of macula, right eye: Secondary | ICD-10-CM | POA: Diagnosis not present

## 2016-08-13 DIAGNOSIS — H35362 Drusen (degenerative) of macula, left eye: Secondary | ICD-10-CM | POA: Diagnosis not present

## 2016-08-13 DIAGNOSIS — H25013 Cortical age-related cataract, bilateral: Secondary | ICD-10-CM | POA: Diagnosis not present

## 2016-09-01 DIAGNOSIS — Z029 Encounter for administrative examinations, unspecified: Secondary | ICD-10-CM

## 2016-09-03 DIAGNOSIS — M9905 Segmental and somatic dysfunction of pelvic region: Secondary | ICD-10-CM | POA: Diagnosis not present

## 2016-09-03 DIAGNOSIS — M25562 Pain in left knee: Secondary | ICD-10-CM | POA: Diagnosis not present

## 2016-09-03 DIAGNOSIS — M9906 Segmental and somatic dysfunction of lower extremity: Secondary | ICD-10-CM | POA: Diagnosis not present

## 2016-09-03 DIAGNOSIS — M9903 Segmental and somatic dysfunction of lumbar region: Secondary | ICD-10-CM | POA: Diagnosis not present

## 2016-09-03 DIAGNOSIS — M545 Low back pain: Secondary | ICD-10-CM | POA: Diagnosis not present

## 2016-09-03 DIAGNOSIS — M9902 Segmental and somatic dysfunction of thoracic region: Secondary | ICD-10-CM | POA: Diagnosis not present

## 2016-09-05 DIAGNOSIS — M9902 Segmental and somatic dysfunction of thoracic region: Secondary | ICD-10-CM | POA: Diagnosis not present

## 2016-09-05 DIAGNOSIS — M545 Low back pain: Secondary | ICD-10-CM | POA: Diagnosis not present

## 2016-09-05 DIAGNOSIS — M9903 Segmental and somatic dysfunction of lumbar region: Secondary | ICD-10-CM | POA: Diagnosis not present

## 2016-09-05 DIAGNOSIS — M9906 Segmental and somatic dysfunction of lower extremity: Secondary | ICD-10-CM | POA: Diagnosis not present

## 2016-09-05 DIAGNOSIS — M25562 Pain in left knee: Secondary | ICD-10-CM | POA: Diagnosis not present

## 2016-09-05 DIAGNOSIS — M9905 Segmental and somatic dysfunction of pelvic region: Secondary | ICD-10-CM | POA: Diagnosis not present

## 2016-09-08 DIAGNOSIS — M545 Low back pain: Secondary | ICD-10-CM | POA: Diagnosis not present

## 2016-09-08 DIAGNOSIS — M9906 Segmental and somatic dysfunction of lower extremity: Secondary | ICD-10-CM | POA: Diagnosis not present

## 2016-09-08 DIAGNOSIS — M9905 Segmental and somatic dysfunction of pelvic region: Secondary | ICD-10-CM | POA: Diagnosis not present

## 2016-09-08 DIAGNOSIS — M9903 Segmental and somatic dysfunction of lumbar region: Secondary | ICD-10-CM | POA: Diagnosis not present

## 2016-09-08 DIAGNOSIS — M25562 Pain in left knee: Secondary | ICD-10-CM | POA: Diagnosis not present

## 2016-09-08 DIAGNOSIS — M9902 Segmental and somatic dysfunction of thoracic region: Secondary | ICD-10-CM | POA: Diagnosis not present

## 2016-09-09 ENCOUNTER — Encounter: Payer: Self-pay | Admitting: Family Medicine

## 2016-09-09 ENCOUNTER — Ambulatory Visit (INDEPENDENT_AMBULATORY_CARE_PROVIDER_SITE_OTHER): Payer: Medicare HMO | Admitting: Family Medicine

## 2016-09-09 VITALS — BP 148/76 | Temp 98.5°F | Ht 66.0 in | Wt 199.0 lb

## 2016-09-09 DIAGNOSIS — H6501 Acute serous otitis media, right ear: Secondary | ICD-10-CM | POA: Diagnosis not present

## 2016-09-09 MED ORDER — AMOXICILLIN-POT CLAVULANATE 875-125 MG PO TABS: 1.0000 | ORAL_TABLET | Freq: Two times a day (BID) | ORAL | 0 refills | Status: DC

## 2016-09-09 MED ORDER — HYDROCODONE-ACETAMINOPHEN 5-325 MG PO TABS: ORAL_TABLET | ORAL | 0 refills | Status: DC

## 2016-09-09 NOTE — Progress Notes (Signed)
   Subjective:    Patient ID: Tracie Brooks, female    DOB: 1945-06-21, 71 y.o.   MRN: [id number]  Otalgia   There is pain in the right ear. This is a new problem. The current episode started yesterday. Treatments tried: essential oils, otc meds.   Pt started working new job  yest at work  Got to feeling plugged up  pai an discomfort  Notes popping sensation  Felt whooshing sound, constant pain, ear sore to touch  No dranage  Not sleeping well  Last wk some sneezing and cong and coughing, but not a ot  Using inahler more often   Has noted sinus pressure   Used tylenol for the pain, and took a claritin early in the morn, took a zyrtec this morn, used essentil oils ,    Review of Systems  HENT: Positive for ear pain.        Objective:   Physical Exam  .Alert active good hydration distinct right otitis media positive nasal congestion pharynx normal lungs clear. Heart regular in rhythm      Assessment & Plan:  Impression right otitis media long discussion held multiple questions answered Augmentin twice a day 10 days. Symptom care warning signs discussed

## 2016-09-19 DIAGNOSIS — M9905 Segmental and somatic dysfunction of pelvic region: Secondary | ICD-10-CM | POA: Diagnosis not present

## 2016-09-19 DIAGNOSIS — M9902 Segmental and somatic dysfunction of thoracic region: Secondary | ICD-10-CM | POA: Diagnosis not present

## 2016-09-19 DIAGNOSIS — M9903 Segmental and somatic dysfunction of lumbar region: Secondary | ICD-10-CM | POA: Diagnosis not present

## 2016-09-19 DIAGNOSIS — M9906 Segmental and somatic dysfunction of lower extremity: Secondary | ICD-10-CM | POA: Diagnosis not present

## 2016-09-19 DIAGNOSIS — M25562 Pain in left knee: Secondary | ICD-10-CM | POA: Diagnosis not present

## 2016-09-19 DIAGNOSIS — M545 Low back pain: Secondary | ICD-10-CM | POA: Diagnosis not present

## 2016-10-11 ENCOUNTER — Other Ambulatory Visit: Payer: Self-pay | Admitting: Family Medicine

## 2016-10-16 ENCOUNTER — Telehealth: Payer: Self-pay | Admitting: Family Medicine

## 2016-10-16 NOTE — Telephone Encounter (Signed)
Patient seen Dr. Richardson Landry on 09/09/16 for right serous otitis media.  She said that her ear is still bothering her.  It doesn't really hurt, except for sometimes.  She is not having any drainage, but is worried that it is not clearing up.  She is requesting to be worked into the schedule tomorrow afternoon with Dr. Richardson Landry.

## 2016-10-16 NOTE — Telephone Encounter (Signed)
Ok ov tomorrow

## 2016-10-16 NOTE — Telephone Encounter (Signed)
Appointment scheduled.

## 2016-10-17 ENCOUNTER — Encounter: Payer: Self-pay | Admitting: Family Medicine

## 2016-10-17 ENCOUNTER — Ambulatory Visit (INDEPENDENT_AMBULATORY_CARE_PROVIDER_SITE_OTHER): Payer: Medicare HMO | Admitting: Family Medicine

## 2016-10-17 VITALS — BP 130/78 | Temp 98.6°F | Ht 66.0 in | Wt 200.8 lb

## 2016-10-17 DIAGNOSIS — H66001 Acute suppurative otitis media without spontaneous rupture of ear drum, right ear: Secondary | ICD-10-CM

## 2016-10-17 MED ORDER — CEFDINIR 300 MG PO CAPS: 300.0000 mg | ORAL_CAPSULE | Freq: Two times a day (BID) | ORAL | 0 refills | Status: DC

## 2016-10-17 NOTE — Progress Notes (Signed)
   Subjective:    Patient ID: Tracie Brooks, female    DOB: 1945-09-05, 71 y.o.   MRN: [id number]  HPI Patient arrives for a recheck on right ear pain. Patient states it is still giving her problems.  The pain aspect has calmed down  But still plugged up and leading to pressure  Pops off and on  Does note some discomfort at times.   No balance difficulties     Review of Systems No headache, no major weight loss or weight gain, no chest pain no back pain abdominal pain no change in bowel habits complete ROS otherwise negative     Objective:   Physical Exam Alert vitals stable, NAD. Blood pressure good on repeat. HEENT Persistent cloudy effusion right ear has erythema on the rim normal. Lungs clear. Heart regular rate and rhythm.        Assessment & Plan:  Impression persistent right otitis media plan one more round of antibiotics symptom care discussed encourage patients regarding resolution of the effusion if persists we'll set up with ENT

## 2016-10-24 ENCOUNTER — Other Ambulatory Visit: Payer: Self-pay | Admitting: Family Medicine

## 2016-11-03 DIAGNOSIS — B029 Zoster without complications: Secondary | ICD-10-CM | POA: Diagnosis not present

## 2016-11-17 ENCOUNTER — Encounter: Payer: Self-pay | Admitting: Family Medicine

## 2016-11-17 ENCOUNTER — Ambulatory Visit (INDEPENDENT_AMBULATORY_CARE_PROVIDER_SITE_OTHER): Payer: Medicare HMO | Admitting: Family Medicine

## 2016-11-17 ENCOUNTER — Other Ambulatory Visit (HOSPITAL_COMMUNITY)
Admission: RE | Admit: 2016-11-17 | Discharge: 2016-11-17 | Disposition: A | Discharge status: Home / Self Care | Payer: Medicare HMO | Source: Ambulatory Visit | Attending: Family Medicine | Admitting: Family Medicine

## 2016-11-17 ENCOUNTER — Ambulatory Visit (HOSPITAL_COMMUNITY)
Admission: RE | Admit: 2016-11-17 | Discharge: 2016-11-17 | Disposition: A | Discharge status: Home / Self Care | Payer: Medicare HMO | Source: Ambulatory Visit | Attending: Family Medicine | Admitting: Family Medicine

## 2016-11-17 VITALS — BP 138/86 | Temp 97.7°F | Ht 66.0 in | Wt 200.6 lb

## 2016-11-17 DIAGNOSIS — M546 Pain in thoracic spine: Secondary | ICD-10-CM | POA: Diagnosis not present

## 2016-11-17 DIAGNOSIS — M9905 Segmental and somatic dysfunction of pelvic region: Secondary | ICD-10-CM | POA: Diagnosis not present

## 2016-11-17 DIAGNOSIS — K5730 Diverticulosis of large intestine without perforation or abscess without bleeding: Secondary | ICD-10-CM | POA: Insufficient documentation

## 2016-11-17 DIAGNOSIS — K8020 Calculus of gallbladder without cholecystitis without obstruction: Secondary | ICD-10-CM | POA: Diagnosis not present

## 2016-11-17 DIAGNOSIS — R109 Unspecified abdominal pain: Secondary | ICD-10-CM

## 2016-11-17 DIAGNOSIS — M545 Low back pain: Secondary | ICD-10-CM | POA: Diagnosis not present

## 2016-11-17 DIAGNOSIS — M9902 Segmental and somatic dysfunction of thoracic region: Secondary | ICD-10-CM | POA: Diagnosis not present

## 2016-11-17 DIAGNOSIS — D1771 Benign lipomatous neoplasm of kidney: Secondary | ICD-10-CM | POA: Insufficient documentation

## 2016-11-17 DIAGNOSIS — M9903 Segmental and somatic dysfunction of lumbar region: Secondary | ICD-10-CM | POA: Diagnosis not present

## 2016-11-17 LAB — CBC WITH DIFFERENTIAL/PLATELET
BASOS ABS: 0 10*3/uL (ref 0.0–0.1)
BASOS PCT: 0 %
EOS ABS: 0.3 10*3/uL (ref 0.0–0.7)
EOS PCT: 4 %
HCT: 40.1 % (ref 36.0–46.0)
Hemoglobin: 13.4 g/dL (ref 12.0–15.0)
Lymphocytes Relative: 17 %
Lymphs Abs: 1.2 10*3/uL (ref 0.7–4.0)
MCH: 30.8 pg (ref 26.0–34.0)
MCHC: 33.4 g/dL (ref 30.0–36.0)
MCV: 92.2 fL (ref 78.0–100.0)
MONO ABS: 0.5 10*3/uL (ref 0.1–1.0)
Monocytes Relative: 8 %
Neutro Abs: 4.8 10*3/uL (ref 1.7–7.7)
Neutrophils Relative %: 71 %
PLATELETS: 242 10*3/uL (ref 150–400)
RBC: 4.35 MIL/uL (ref 3.87–5.11)
RDW: 14.6 % (ref 11.5–15.5)
WBC: 6.9 10*3/uL (ref 4.0–10.5)

## 2016-11-17 LAB — HEPATIC FUNCTION PANEL
ALBUMIN: 4.5 g/dL (ref 3.5–5.0)
ALT: 52 U/L (ref 14–54)
AST: 50 U/L — AB (ref 15–41)
Alkaline Phosphatase: 124 U/L (ref 38–126)
TOTAL PROTEIN: 7.8 g/dL (ref 6.5–8.1)
Total Bilirubin: 0.4 mg/dL (ref 0.3–1.2)

## 2016-11-17 LAB — URINALYSIS, ROUTINE W REFLEX MICROSCOPIC
Bilirubin Urine: NEGATIVE
Glucose, UA: NEGATIVE mg/dL
HGB URINE DIPSTICK: NEGATIVE
Ketones, ur: NEGATIVE mg/dL
Leukocytes, UA: NEGATIVE
NITRITE: NEGATIVE
PROTEIN: NEGATIVE mg/dL
Specific Gravity, Urine: 1.009 (ref 1.005–1.030)
pH: 7 (ref 5.0–8.0)

## 2016-11-17 LAB — BASIC METABOLIC PANEL
ANION GAP: 8 (ref 5–15)
BUN: 16 mg/dL (ref 6–20)
CO2: 28 mmol/L (ref 22–32)
Calcium: 10.7 mg/dL — ABNORMAL HIGH (ref 8.9–10.3)
Chloride: 103 mmol/L (ref 101–111)
Creatinine, Ser: 0.85 mg/dL (ref 0.44–1.00)
GLUCOSE: 102 mg/dL — AB (ref 65–99)
POTASSIUM: 4.4 mmol/L (ref 3.5–5.1)
SODIUM: 139 mmol/L (ref 135–145)

## 2016-11-17 LAB — AMYLASE: AMYLASE: 43 U/L (ref 28–100)

## 2016-11-17 LAB — LIPASE, BLOOD: Lipase: 38 U/L (ref 11–51)

## 2016-11-17 NOTE — Progress Notes (Signed)
   Subjective:    Patient ID: Tracie Brooks, female    DOB: [birth year], 71 y.o.   MRN: [id number]  HPI  Patient arrives with c/o pain in ribs, abdomen and back for 2 weeks.  Had case of shingles, and was kxed on the sixth of aug  On April third lifted something heavy, saw chiropractor, Pt then saw spots come up on right side  Went to see dr hall, nurse practitioner  Had sprad quite a bit  Took vlatrex or zovirax  Pain in left side 48 hrs post the shingles apearance,  Developed pain and discomfort and soreness  Shingles pain persisting with discomfrot and cannot slep on that side  essenrial oil blend is a "life saver" and helping  Colonoscopy just ;last yr  No bs fever,  No nausea or voniting,  No change in appetite  Two colonoscopies no hx of diverticulosis                     Pt having throbbing pain and soreness  Left side and mid abd and back Pain severe  "ear still plugged up"    Review of Systems No headache, no major weight loss or weight gain, no chest pain no back pain abdominal pain no change in bowel habits complete ROS otherwise negative     Objective:   Physical Exam Alert and oriented, vitals reviewed and stable, NAD ENT-TM's and ext canals WNL bilat via otoscopic exam Soft palate, tonsils and post pharynx WNL via oropharyngeal exam Neck-symmetric, no masses; thyroid nonpalpable and nontender Pulmonary-no tachypnea or accessory muscle use; Clear without wheezes via auscultation Card--no abnrml murmurs, rhythm reg and rate WNL Carotid pulses symmetric, without bruits Posterior right flank healing active lesions Abdomen positive left upper quadrant tenderness bowel sounds present. Patient tearful during exam complaining of severe pain.    Assessment & Plan:  Impression intra-abdominal pain. Uncertain etiology. Enough warning signs present to warrant further workup. Urgent blood work done. See results. Urgent CT scan done see  results. Fortunately no intra-abdominal process. Symptom care discussed. 1 signs discussed.  Greater than 50% of this 25 minute face to face visit was spent in counseling and discussion and coordination of care regarding the above diagnosis/diagnosies

## 2016-11-18 ENCOUNTER — Telehealth: Payer: Self-pay | Admitting: Family Medicine

## 2016-11-18 ENCOUNTER — Other Ambulatory Visit: Payer: Self-pay

## 2016-11-18 MED ORDER — TRAMADOL HCL 50 MG PO TABS: ORAL_TABLET | ORAL | 0 refills | Status: DC

## 2016-11-18 NOTE — Telephone Encounter (Signed)
Patient said she was supposed to have Tramadol called in yesterday, but pharmacy never received anything.  She said she is in a lot of pain and needs this sent ASAP.  [facility]

## 2016-11-18 NOTE — Telephone Encounter (Signed)
Spoke with patient and informed her that her prescription for Tramadol was called into the pharmacy. Patient verbalized understanding.

## 2016-11-19 DIAGNOSIS — M545 Low back pain: Secondary | ICD-10-CM | POA: Diagnosis not present

## 2016-11-19 DIAGNOSIS — M9903 Segmental and somatic dysfunction of lumbar region: Secondary | ICD-10-CM | POA: Diagnosis not present

## 2016-11-19 DIAGNOSIS — M546 Pain in thoracic spine: Secondary | ICD-10-CM | POA: Diagnosis not present

## 2016-11-19 DIAGNOSIS — M9905 Segmental and somatic dysfunction of pelvic region: Secondary | ICD-10-CM | POA: Diagnosis not present

## 2016-11-19 DIAGNOSIS — M9902 Segmental and somatic dysfunction of thoracic region: Secondary | ICD-10-CM | POA: Diagnosis not present

## 2016-11-21 DIAGNOSIS — M9902 Segmental and somatic dysfunction of thoracic region: Secondary | ICD-10-CM | POA: Diagnosis not present

## 2016-11-21 DIAGNOSIS — M9903 Segmental and somatic dysfunction of lumbar region: Secondary | ICD-10-CM | POA: Diagnosis not present

## 2016-11-21 DIAGNOSIS — M545 Low back pain: Secondary | ICD-10-CM | POA: Diagnosis not present

## 2016-11-21 DIAGNOSIS — M546 Pain in thoracic spine: Secondary | ICD-10-CM | POA: Diagnosis not present

## 2016-11-21 DIAGNOSIS — M9905 Segmental and somatic dysfunction of pelvic region: Secondary | ICD-10-CM | POA: Diagnosis not present

## 2016-11-23 ENCOUNTER — Encounter: Payer: Self-pay | Admitting: Family Medicine

## 2016-11-25 ENCOUNTER — Ambulatory Visit (INDEPENDENT_AMBULATORY_CARE_PROVIDER_SITE_OTHER): Payer: Medicare HMO | Admitting: Family Medicine

## 2016-11-25 VITALS — BP 144/90 | Temp 98.7°F | Ht 66.0 in | Wt 196.0 lb

## 2016-11-25 DIAGNOSIS — B0229 Other postherpetic nervous system involvement: Secondary | ICD-10-CM | POA: Diagnosis not present

## 2016-11-25 MED ORDER — HYDROCODONE-ACETAMINOPHEN 5-325 MG PO TABS: ORAL_TABLET | ORAL | 0 refills | Status: DC

## 2016-11-25 MED ORDER — GABAPENTIN 300 MG PO CAPS: ORAL_CAPSULE | ORAL | 0 refills | Status: DC

## 2016-11-25 NOTE — Progress Notes (Signed)
   Subjective:    Patient ID: Tracie Brooks, female    DOB: 1945-09-20, 71 y.o.   MRN: [id number]  HPIpt arrives today to discuss med for nerve pain from shingles. Shingles across right breast. Diagnosed at Dr. Juel Burrow office.  Took last tramadol this am. Did not help much with the pain  Pain very significant  Scabs have dopped off, new skin is very sensitive and tender,  Pain sharp and shooting at tines  Pain very severe ad tender   Pain horrible last night   Took last tramadol last this morning  Tylenol not helping    Review of Systems No headache, no major weight loss or weight gain, no chest pain no back pain abdominal pain no change in bowel habits complete ROS otherwise negative     Objective:   Physical Exam  Alert and oriented, vitals reviewed and stable, NAD ENT-TM's and ext canals WNL bilat via otoscopic exam Soft palate, tonsils and post pharynx WNL via oropharyngeal exam Neck-symmetric, no masses; thyroid nonpalpable and nontender Pulmonary-no tachypnea or accessory muscle use; Clear without wheezes via auscultation Card--no abnrml murmurs, rhythm reg and rate WNL Carotid pulses symmetric, without bruits Right posterior superior thorax reveals healing shingles lesions with hyperemia and hypersensitivity      Assessment & Plan:  Impression 1 post shingles neuropathy. Discussed at length. Multiple options discussed. We'll press on with gabapentin. And when necessary hydrocodone. Hopefully expect improvement with this and hopefully resolution as a week's go by, but certainly could be longer, discussed. Multiple questions answered. Long discussion held regarding the nature of neuropathic pain. Recheck in several weeks  Greater than 50% of this 25 minute face to face visit was spent in counseling and discussion and coordination of care regarding the above diagnosis/diagnosies

## 2016-11-25 NOTE — Telephone Encounter (Signed)
Office visit scheduled to discuss medication options.

## 2016-11-26 DIAGNOSIS — M9905 Segmental and somatic dysfunction of pelvic region: Secondary | ICD-10-CM | POA: Diagnosis not present

## 2016-11-26 DIAGNOSIS — M546 Pain in thoracic spine: Secondary | ICD-10-CM | POA: Diagnosis not present

## 2016-11-26 DIAGNOSIS — M545 Low back pain: Secondary | ICD-10-CM | POA: Diagnosis not present

## 2016-11-26 DIAGNOSIS — M9903 Segmental and somatic dysfunction of lumbar region: Secondary | ICD-10-CM | POA: Diagnosis not present

## 2016-11-26 DIAGNOSIS — M9902 Segmental and somatic dysfunction of thoracic region: Secondary | ICD-10-CM | POA: Diagnosis not present

## 2016-12-05 DIAGNOSIS — M9902 Segmental and somatic dysfunction of thoracic region: Secondary | ICD-10-CM | POA: Diagnosis not present

## 2016-12-05 DIAGNOSIS — M545 Low back pain: Secondary | ICD-10-CM | POA: Diagnosis not present

## 2016-12-05 DIAGNOSIS — M9905 Segmental and somatic dysfunction of pelvic region: Secondary | ICD-10-CM | POA: Diagnosis not present

## 2016-12-05 DIAGNOSIS — M9903 Segmental and somatic dysfunction of lumbar region: Secondary | ICD-10-CM | POA: Diagnosis not present

## 2016-12-05 DIAGNOSIS — M546 Pain in thoracic spine: Secondary | ICD-10-CM | POA: Diagnosis not present

## 2016-12-16 ENCOUNTER — Encounter: Payer: Self-pay | Admitting: Family Medicine

## 2016-12-16 ENCOUNTER — Ambulatory Visit (INDEPENDENT_AMBULATORY_CARE_PROVIDER_SITE_OTHER): Payer: Medicare HMO | Admitting: Family Medicine

## 2016-12-16 VITALS — BP 128/82 | Ht 66.0 in | Wt 199.8 lb

## 2016-12-16 DIAGNOSIS — B0229 Other postherpetic nervous system involvement: Secondary | ICD-10-CM

## 2016-12-16 NOTE — Progress Notes (Signed)
   Subjective:    Patient ID: Tracie Brooks, female    DOB: 04/22/1945, 71 y.o.   MRN: [id number]  HPI  Patient arrives for a follow up on shingles related nerve pain- patient states it is getting no better  Pt still having sharp shooting pains across the bk and partic the ant right breast   neurontin helping soe bu t not a lot  siede effects from the gabapentin  Pt notes fuzziness in her thinking and feels slightly groggy with it  Pt feels incr gas and feelng intest symtoms  Pt has not taken any hydrocodone since starting the gabapentin  Pt not interested in narcotics at this time  States tramadol helped a little but not much, notes gabapentin slightly better than  Review of Systems No headache, no major weight loss or weight gain, no chest pain no back pain abdominal pain no change in bowel habits complete ROS otherwise negative     Objective:   Physical Exam Alert and oriented, vitals reviewed and stable, NAD ENT-TM's and ext canals WNL bilat via otoscopic exam Soft palate, tonsils and post pharynx WNL via oropharyngeal exam Neck-symmetric, no masses; thyroid nonpalpable and nontender Pulmonary-no tachypnea or accessory muscle use; Clear without wheezes via auscultation Card--no abnrml murmurs, rhythm reg and rate WNL Carotid pulses symmetric, without bruits Healing shingles lesions. Hypersensitivity 2 times       Assessment & Plan:  Impression post shingles neuropathy. Substantial pain. Patient very resistant to narcotics. States the gabapentin is causing excess sedation. Frustrated with her level of pain and discomfort. Asked about the use of cannabinoids. Long discussion held about Lyrica. Patient to research on her own. Maintain gabapentin for now. Pain referral rationale discussed  Greater than 50% of this 25 minute face to face visit was spent in counseling and discussion and coordination of care regarding the above diagnosis/diagnosies

## 2016-12-16 NOTE — Patient Instructions (Addendum)
lyrica

## 2016-12-18 ENCOUNTER — Encounter: Payer: Self-pay | Admitting: Family Medicine

## 2016-12-22 ENCOUNTER — Other Ambulatory Visit: Payer: Self-pay | Admitting: Family Medicine

## 2017-02-25 ENCOUNTER — Other Ambulatory Visit: Payer: Self-pay

## 2017-02-25 ENCOUNTER — Encounter: Payer: Self-pay | Admitting: Gastroenterology

## 2017-02-25 ENCOUNTER — Ambulatory Visit: Payer: Medicare HMO | Admitting: Gastroenterology

## 2017-02-25 DIAGNOSIS — K7581 Nonalcoholic steatohepatitis (NASH): Secondary | ICD-10-CM | POA: Diagnosis not present

## 2017-02-25 DIAGNOSIS — I10 Essential (primary) hypertension: Secondary | ICD-10-CM | POA: Diagnosis not present

## 2017-02-25 DIAGNOSIS — K7460 Unspecified cirrhosis of liver: Secondary | ICD-10-CM

## 2017-02-25 DIAGNOSIS — Z1211 Encounter for screening for malignant neoplasm of colon: Secondary | ICD-10-CM | POA: Diagnosis not present

## 2017-02-25 NOTE — Addendum Note (Signed)
Addended by: Danie Binder on: 02/25/2017 03:12 PM   Modules accepted: Level of Service

## 2017-02-25 NOTE — Assessment & Plan Note (Signed)
LAST HFP AUG 2018 AST 52 ALT 50. WORKING ON WEIGHT LOSS/WELLNESS.  HFP THIS WEEK AND IN 6 MOS. CONTINUE YOUR WEIGHT LOSS EFFORTS. DRINK WATER TO KEEP YOUR URINE LIGHT YELLOW. FOLLOW UP IN 1 YEAR.  PLEASE CALL WITH QUESTIONS OR CONCERNS.

## 2017-02-25 NOTE — Assessment & Plan Note (Signed)
BP NOT IDEALLY CONTROLLED.  SEE DR. Wolfgang Phoenix WITHIN THE NEXT 1-2 WEEKS REGARDING YOUR BLOOD PRESSURE.

## 2017-02-25 NOTE — Progress Notes (Signed)
Subjective:    Patient ID: Tracie Brooks, female    DOB: 05/17/1945, 71 y.o.   MRN: [id number]  Mikey Kirschner, MD   HPI GOING TO A WELLNESS PROGRAM WITH BEEF BROTH CLEANSE AND LOSING 40 LBS. PROGRAM WILL LAST 4 MOS. GOT PCN INJECTION FOR 7 MOS FOR RHEUMATIC FEVER.RECENTLY HAD EAR INFECTION. ABX ROUNDS x2 AND BROKE OUT IN SHINGLES. HAD OUTBREAK IN R BACK AND BREAST. SAW ACCUPUNCTURIST FOR PAIN(NERVE) BETTER BUT NOT RESOLVED. BMS: GOOD.  GABAPENTIN MADE HER CONSTIPATED/FOGGY BRAIN. MILD DISCOMFORT ON LEFT FLANK WORKED WITH CT DUE TO SHINGLES.   PT DENIES FEVER, CHILLS, HEMATOCHEZIA, HEMATEMESIS, nausea, vomiting, melena, diarrhea, CHEST PAIN, SHORTNESS OF BREATH,  CHANGE IN BOWEL IN HABITS, abdominal pain, problems swallowing, OR heartburn or indigestion.  Past Medical History:  Diagnosis Date  . Allergy   . Asthma   . Chest pain   . Dyslipidemia   . Hyperlipidemia   . Hypertension   . IBS (irritable bowel syndrome)    Past Surgical History:  Procedure Laterality Date  . ABDOMINAL HYSTERECTOMY    . BIOPSY BREAST  1985  . COLONOSCOPY    . COLONOSCOPY N/A 09/03/2015   Procedure: COLONOSCOPY;  Surgeon: Danie Binder, MD;  Location: [facility];  Service: Endoscopy;  Laterality: N/A;  1400  . KNEE ARTHROSCOPY     right 1982   Allergies  Allergen Reactions  . Beta Adrenergic Blockers     swelling  . Ibuprofen   . Levsin [Hyoscyamine Sulfate]     Blurred vision  . Lidocaine     cream  . Pravastatin     Joint pain  . Sulfa Antibiotics   . Vicodin [Hydrocodone-Acetaminophen]   . Zocor [Simvastatin]     Joint pain   Current Outpatient Medications  Medication Sig Dispense Refill  . b complex vitamins tablet Take 1 tablet by mouth daily.    Marland Kitchen FLOVENT HFA 220 MCG/ACT inhaler INHALE 2 PUFFS INTO THE LUNGS 2 (TWO) TIMES DAILY AS NEEDED FOR ALLERGIES.    Marland Kitchen milk thistle 175 MG tablet Take 175 mg by mouth daily.    . Probiotic Product (PROBIOTIC DAILY PO) Take 3 capsules by  mouth daily.     Marland Kitchen UNABLE TO FIND daily. [place] Astaxanthin    . VENTOLIN HFA 108 (90 Base) MCG/ACT inhaler USE 2 PUFFS EVERY 4 TO 6 HOURS AS NEEDED FOR SHORTNESS OF BREATH/WHEEZING.    .      .      .       Review of Systems PER HPI OTHERWISE ALL SYSTEMS ARE NEGATIVE.    Objective:   Physical Exam  Constitutional: She is oriented to person, place, and time. She appears well-developed and well-nourished. No distress.  HENT:  Head: Normocephalic and atraumatic.  Mouth/Throat: Oropharynx is clear and moist. No oropharyngeal exudate.  Eyes: Pupils are equal, round, and reactive to light. No scleral icterus.  Neck: Normal range of motion. Neck supple.  Cardiovascular: Normal rate, regular rhythm and normal heart sounds.  Pulmonary/Chest: Effort normal and breath sounds normal. No respiratory distress.  Abdominal: Soft. Bowel sounds are normal. She exhibits no distension. There is no tenderness.  Musculoskeletal: She exhibits no edema.  Lymphadenopathy:    She has no cervical adenopathy.  Neurological: She is alert and oriented to person, place, and time.  NO  NEW FOCAL DEFICITS  Skin: Skin is warm.  MILD TTP IN THE RUQ.  Psychiatric:  SLIGHTLY ANXIOUS MOOD, NL AFFECT  Vitals reviewed.     Assessment & Plan:

## 2017-02-25 NOTE — Patient Instructions (Addendum)
SEE DR. Wolfgang Phoenix WITHIN THE NEXT 1-2 WEEKS REGARDING YOUR BLOOD PRESSURE.  CHECK LIVER PANEL THIS WEEK AND IN 6 MOS.  CONTINUE YOUR WEIGHT LOSS EFFORTS.  DRINK WATER TO KEEP YOUR URINE LIGHT YELLOW.  FOLLOW UP IN 1 YEAR.   PLEASE CALL WITH QUESTIONS OR CONCERNS.

## 2017-02-25 NOTE — Assessment & Plan Note (Signed)
NO WARNING SIGNS/SYMPTOMS  NEXT TCS BEWTEEN 2027-2032 IF THE BENEFITS OUTWEIGH THE RISKS.

## 2017-02-26 ENCOUNTER — Ambulatory Visit (INDEPENDENT_AMBULATORY_CARE_PROVIDER_SITE_OTHER): Payer: Medicare HMO | Admitting: Family Medicine

## 2017-02-26 ENCOUNTER — Other Ambulatory Visit: Payer: Self-pay

## 2017-02-26 VITALS — BP 158/90 | Ht 66.0 in | Wt 199.0 lb

## 2017-02-26 DIAGNOSIS — B0229 Other postherpetic nervous system involvement: Secondary | ICD-10-CM | POA: Diagnosis not present

## 2017-02-26 DIAGNOSIS — K7581 Nonalcoholic steatohepatitis (NASH): Secondary | ICD-10-CM

## 2017-02-26 DIAGNOSIS — I10 Essential (primary) hypertension: Secondary | ICD-10-CM | POA: Diagnosis not present

## 2017-02-26 LAB — HEPATIC FUNCTION PANEL
AG RATIO: 1.8 (calc) (ref 1.0–2.5)
ALT: 25 U/L (ref 6–29)
AST: 22 U/L (ref 10–35)
Albumin: 4.3 g/dL (ref 3.6–5.1)
Alkaline phosphatase (APISO): 110 U/L (ref 33–130)
BILIRUBIN DIRECT: 0.1 mg/dL (ref 0.0–0.2)
BILIRUBIN INDIRECT: 0.3 mg/dL (ref 0.2–1.2)
GLOBULIN: 2.4 g/dL (ref 1.9–3.7)
Total Bilirubin: 0.4 mg/dL (ref 0.2–1.2)
Total Protein: 6.7 g/dL (ref 6.1–8.1)

## 2017-02-26 NOTE — Progress Notes (Signed)
Pt went to GI, had elevatd blood pressure. Uses an automatic cuff and then did the trad tn approa and numbes were elevatoe  Pt note sig neuropathic pain improvement psost five or six acupuncture shots   Pt off on exercise these recdnt months   Not exrcising as much, and dimished ability to walk     Review of systems  No headache, no major weight loss or weight gain, no chest pain no back pain abdominal pain no change in bowel habits complete ROS otherwise negative  Alert vitals stable, NAD. Blood pressure good on repeat. HEENT normal. Lungs clear. Heart regular rate and rhythm.  Blood pressure 158/90  Impression hypertension.  Suboptimal control.  Long discussion held.  Patient tends to get numerous side effects with virtually every medication we try.  Since blood pressure has been generally decent over the past year except for a couple times.  Would recommend ongoing efforts on the part of patient.  She admits to not exercising and is going to work on

## 2017-02-26 NOTE — Progress Notes (Signed)
Pt is aware.  

## 2017-02-26 NOTE — Progress Notes (Signed)
cc'd to pcp 

## 2017-05-08 ENCOUNTER — Other Ambulatory Visit: Payer: Self-pay | Admitting: *Deleted

## 2017-05-08 ENCOUNTER — Encounter: Payer: Self-pay | Admitting: *Deleted

## 2017-05-08 DIAGNOSIS — K7581 Nonalcoholic steatohepatitis (NASH): Secondary | ICD-10-CM

## 2017-06-26 ENCOUNTER — Ambulatory Visit: Payer: Managed Care, Other (non HMO) | Admitting: "Endocrinology

## 2017-07-02 ENCOUNTER — Other Ambulatory Visit: Payer: Self-pay | Admitting: "Endocrinology

## 2017-07-02 ENCOUNTER — Other Ambulatory Visit: Payer: Self-pay

## 2017-07-02 ENCOUNTER — Ambulatory Visit: Payer: Medicare HMO | Admitting: "Endocrinology

## 2017-07-02 DIAGNOSIS — E559 Vitamin D deficiency, unspecified: Secondary | ICD-10-CM

## 2017-07-17 DIAGNOSIS — I10 Essential (primary) hypertension: Secondary | ICD-10-CM | POA: Diagnosis not present

## 2017-07-31 DIAGNOSIS — I10 Essential (primary) hypertension: Secondary | ICD-10-CM | POA: Diagnosis not present

## 2017-07-31 DIAGNOSIS — K760 Fatty (change of) liver, not elsewhere classified: Secondary | ICD-10-CM | POA: Diagnosis not present

## 2017-07-31 DIAGNOSIS — H6521 Chronic serous otitis media, right ear: Secondary | ICD-10-CM | POA: Diagnosis not present

## 2017-07-31 DIAGNOSIS — E7212 Methylenetetrahydrofolate reductase deficiency: Secondary | ICD-10-CM | POA: Diagnosis not present

## 2017-07-31 DIAGNOSIS — I00 Rheumatic fever without heart involvement: Secondary | ICD-10-CM | POA: Diagnosis not present

## 2017-07-31 DIAGNOSIS — K7581 Nonalcoholic steatohepatitis (NASH): Secondary | ICD-10-CM | POA: Diagnosis not present

## 2017-07-31 DIAGNOSIS — L2084 Intrinsic (allergic) eczema: Secondary | ICD-10-CM | POA: Diagnosis not present

## 2017-07-31 DIAGNOSIS — E559 Vitamin D deficiency, unspecified: Secondary | ICD-10-CM | POA: Diagnosis not present

## 2017-07-31 DIAGNOSIS — L958 Other vasculitis limited to the skin: Secondary | ICD-10-CM | POA: Diagnosis not present

## 2017-08-07 ENCOUNTER — Ambulatory Visit: Payer: Medicare HMO | Admitting: "Endocrinology

## 2017-08-18 DIAGNOSIS — H2513 Age-related nuclear cataract, bilateral: Secondary | ICD-10-CM | POA: Diagnosis not present

## 2017-08-18 DIAGNOSIS — H35363 Drusen (degenerative) of macula, bilateral: Secondary | ICD-10-CM | POA: Diagnosis not present

## 2017-08-18 DIAGNOSIS — H1013 Acute atopic conjunctivitis, bilateral: Secondary | ICD-10-CM | POA: Diagnosis not present

## 2017-08-18 DIAGNOSIS — H25013 Cortical age-related cataract, bilateral: Secondary | ICD-10-CM | POA: Diagnosis not present

## 2017-08-18 DIAGNOSIS — H04123 Dry eye syndrome of bilateral lacrimal glands: Secondary | ICD-10-CM | POA: Diagnosis not present

## 2017-08-19 ENCOUNTER — Encounter: Payer: Self-pay | Admitting: Family Medicine

## 2017-10-16 DIAGNOSIS — K760 Fatty (change of) liver, not elsewhere classified: Secondary | ICD-10-CM | POA: Diagnosis not present

## 2017-10-16 DIAGNOSIS — I10 Essential (primary) hypertension: Secondary | ICD-10-CM | POA: Diagnosis not present

## 2017-10-16 DIAGNOSIS — E559 Vitamin D deficiency, unspecified: Secondary | ICD-10-CM | POA: Diagnosis not present

## 2017-10-16 DIAGNOSIS — K7581 Nonalcoholic steatohepatitis (NASH): Secondary | ICD-10-CM | POA: Diagnosis not present

## 2017-10-16 DIAGNOSIS — I00 Rheumatic fever without heart involvement: Secondary | ICD-10-CM | POA: Diagnosis not present

## 2017-10-16 DIAGNOSIS — H6521 Chronic serous otitis media, right ear: Secondary | ICD-10-CM | POA: Diagnosis not present

## 2017-10-16 DIAGNOSIS — L958 Other vasculitis limited to the skin: Secondary | ICD-10-CM | POA: Diagnosis not present

## 2017-10-16 DIAGNOSIS — E7212 Methylenetetrahydrofolate reductase deficiency: Secondary | ICD-10-CM | POA: Diagnosis not present

## 2017-10-16 DIAGNOSIS — E7211 Homocystinuria: Secondary | ICD-10-CM | POA: Diagnosis not present

## 2017-10-26 DIAGNOSIS — H6521 Chronic serous otitis media, right ear: Secondary | ICD-10-CM | POA: Diagnosis not present

## 2017-10-26 DIAGNOSIS — E7211 Homocystinuria: Secondary | ICD-10-CM | POA: Diagnosis not present

## 2017-10-26 DIAGNOSIS — E7212 Methylenetetrahydrofolate reductase deficiency: Secondary | ICD-10-CM | POA: Diagnosis not present

## 2017-10-26 DIAGNOSIS — L958 Other vasculitis limited to the skin: Secondary | ICD-10-CM | POA: Diagnosis not present

## 2017-10-26 DIAGNOSIS — K7581 Nonalcoholic steatohepatitis (NASH): Secondary | ICD-10-CM | POA: Diagnosis not present

## 2017-10-26 DIAGNOSIS — L2084 Intrinsic (allergic) eczema: Secondary | ICD-10-CM | POA: Diagnosis not present

## 2017-10-26 DIAGNOSIS — K760 Fatty (change of) liver, not elsewhere classified: Secondary | ICD-10-CM | POA: Diagnosis not present

## 2017-10-26 DIAGNOSIS — I00 Rheumatic fever without heart involvement: Secondary | ICD-10-CM | POA: Diagnosis not present

## 2017-10-26 DIAGNOSIS — E559 Vitamin D deficiency, unspecified: Secondary | ICD-10-CM | POA: Diagnosis not present

## 2018-01-01 ENCOUNTER — Ambulatory Visit (INDEPENDENT_AMBULATORY_CARE_PROVIDER_SITE_OTHER): Payer: Medicare HMO | Admitting: Family Medicine

## 2018-01-01 ENCOUNTER — Encounter: Payer: Self-pay | Admitting: Family Medicine

## 2018-01-01 VITALS — BP 178/98 | Ht 66.0 in | Wt 160.0 lb

## 2018-01-01 DIAGNOSIS — D171 Benign lipomatous neoplasm of skin and subcutaneous tissue of trunk: Secondary | ICD-10-CM | POA: Diagnosis not present

## 2018-01-01 NOTE — Progress Notes (Signed)
   Subjective:    Patient ID: Tracie Brooks, female    DOB: 01-26-1946, 72 y.o.   MRN: [id number]  HPI Patient is here today with complaints of a lump below her waist. Left side of waist.  Has noticed since septembr aout on emonth  Seems a bit sensitive at tines     ?depends on what she eat s may be worse?   Review of Systems No headache, no major weight loss or weight gain, no chest pain no back pain abdominal pain no change in bowel habits complete ROS otherwise negative     Objective:   Physical Exam  Alert vitals stable, NAD. Blood pressure good on repeat. HEENT normal. Lungs clear. Heart regular rate and rhythm. Left lateral mid abdomen soft freely movable indistinct subcutaneous lump      Assessment & Plan:  Impression lipoma very long discussion/recent literature suggesting caution in this area and that the only way we can diagnose with total certainty is surgical biopsy  99.99% likelihood this is proper diagnosis.  Only way we can be 299% certain is surgical excision which the patient does not want, and I agree with.  Feel free to contact us if worsens

## 2018-01-11 DIAGNOSIS — L958 Other vasculitis limited to the skin: Secondary | ICD-10-CM | POA: Diagnosis not present

## 2018-01-11 DIAGNOSIS — H6521 Chronic serous otitis media, right ear: Secondary | ICD-10-CM | POA: Diagnosis not present

## 2018-01-11 DIAGNOSIS — K7581 Nonalcoholic steatohepatitis (NASH): Secondary | ICD-10-CM | POA: Diagnosis not present

## 2018-01-11 DIAGNOSIS — K760 Fatty (change of) liver, not elsewhere classified: Secondary | ICD-10-CM | POA: Diagnosis not present

## 2018-01-11 DIAGNOSIS — E559 Vitamin D deficiency, unspecified: Secondary | ICD-10-CM | POA: Diagnosis not present

## 2018-01-11 DIAGNOSIS — I00 Rheumatic fever without heart involvement: Secondary | ICD-10-CM | POA: Diagnosis not present

## 2018-01-11 DIAGNOSIS — I10 Essential (primary) hypertension: Secondary | ICD-10-CM | POA: Diagnosis not present

## 2018-01-11 DIAGNOSIS — E7212 Methylenetetrahydrofolate reductase deficiency: Secondary | ICD-10-CM | POA: Diagnosis not present

## 2018-01-11 DIAGNOSIS — L2084 Intrinsic (allergic) eczema: Secondary | ICD-10-CM | POA: Diagnosis not present

## 2018-05-27 ENCOUNTER — Encounter (HOSPITAL_COMMUNITY): Payer: Self-pay

## 2018-05-27 ENCOUNTER — Inpatient Hospital Stay (HOSPITAL_COMMUNITY)
Admission: EM | Admit: 2018-05-27 | Discharge: 2018-05-29 | DRG: 470 | Disposition: A | Discharge status: Home Health | Payer: Medicare HMO | Source: Ambulatory Visit | Attending: Internal Medicine | Admitting: Internal Medicine

## 2018-05-27 ENCOUNTER — Inpatient Hospital Stay (HOSPITAL_COMMUNITY): Payer: Medicare HMO

## 2018-05-27 ENCOUNTER — Other Ambulatory Visit: Payer: Self-pay

## 2018-05-27 DIAGNOSIS — J45909 Unspecified asthma, uncomplicated: Secondary | ICD-10-CM | POA: Diagnosis not present

## 2018-05-27 DIAGNOSIS — K7581 Nonalcoholic steatohepatitis (NASH): Secondary | ICD-10-CM | POA: Diagnosis present

## 2018-05-27 DIAGNOSIS — J4520 Mild intermittent asthma, uncomplicated: Secondary | ICD-10-CM | POA: Diagnosis not present

## 2018-05-27 DIAGNOSIS — I69359 Hemiplegia and hemiparesis following cerebral infarction affecting unspecified side: Secondary | ICD-10-CM | POA: Diagnosis present

## 2018-05-27 DIAGNOSIS — Z885 Allergy status to narcotic agent status: Secondary | ICD-10-CM

## 2018-05-27 DIAGNOSIS — Z884 Allergy status to anesthetic agent status: Secondary | ICD-10-CM

## 2018-05-27 DIAGNOSIS — R030 Elevated blood-pressure reading, without diagnosis of hypertension: Secondary | ICD-10-CM | POA: Diagnosis present

## 2018-05-27 DIAGNOSIS — D62 Acute posthemorrhagic anemia: Secondary | ICD-10-CM | POA: Diagnosis not present

## 2018-05-27 DIAGNOSIS — E785 Hyperlipidemia, unspecified: Secondary | ICD-10-CM | POA: Diagnosis present

## 2018-05-27 DIAGNOSIS — Z886 Allergy status to analgesic agent status: Secondary | ICD-10-CM

## 2018-05-27 DIAGNOSIS — Z419 Encounter for procedure for purposes other than remedying health state, unspecified: Secondary | ICD-10-CM

## 2018-05-27 DIAGNOSIS — M25551 Pain in right hip: Secondary | ICD-10-CM | POA: Diagnosis not present

## 2018-05-27 DIAGNOSIS — S72001A Fracture of unspecified part of neck of right femur, initial encounter for closed fracture: Principal | ICD-10-CM | POA: Diagnosis present

## 2018-05-27 DIAGNOSIS — I160 Hypertensive urgency: Secondary | ICD-10-CM | POA: Diagnosis not present

## 2018-05-27 DIAGNOSIS — Z8349 Family history of other endocrine, nutritional and metabolic diseases: Secondary | ICD-10-CM | POA: Diagnosis not present

## 2018-05-27 DIAGNOSIS — W010XXA Fall on same level from slipping, tripping and stumbling without subsequent striking against object, initial encounter: Secondary | ICD-10-CM | POA: Diagnosis not present

## 2018-05-27 DIAGNOSIS — Z882 Allergy status to sulfonamides status: Secondary | ICD-10-CM | POA: Diagnosis not present

## 2018-05-27 DIAGNOSIS — Z8249 Family history of ischemic heart disease and other diseases of the circulatory system: Secondary | ICD-10-CM | POA: Diagnosis not present

## 2018-05-27 DIAGNOSIS — I10 Essential (primary) hypertension: Secondary | ICD-10-CM | POA: Diagnosis not present

## 2018-05-27 DIAGNOSIS — Z01818 Encounter for other preprocedural examination: Secondary | ICD-10-CM

## 2018-05-27 DIAGNOSIS — Z8261 Family history of arthritis: Secondary | ICD-10-CM | POA: Diagnosis not present

## 2018-05-27 DIAGNOSIS — S72301A Unspecified fracture of shaft of right femur, initial encounter for closed fracture: Secondary | ICD-10-CM | POA: Diagnosis not present

## 2018-05-27 DIAGNOSIS — Z96641 Presence of right artificial hip joint: Secondary | ICD-10-CM | POA: Diagnosis not present

## 2018-05-27 DIAGNOSIS — Z471 Aftercare following joint replacement surgery: Secondary | ICD-10-CM | POA: Diagnosis not present

## 2018-05-27 DIAGNOSIS — Z888 Allergy status to other drugs, medicaments and biological substances status: Secondary | ICD-10-CM

## 2018-05-27 LAB — CBC WITH DIFFERENTIAL/PLATELET
ABS IMMATURE GRANULOCYTES: 0.06 10*3/uL (ref 0.00–0.07)
Basophils Absolute: 0 10*3/uL (ref 0.0–0.1)
Basophils Relative: 0 %
Eosinophils Absolute: 0.1 10*3/uL (ref 0.0–0.5)
Eosinophils Relative: 1 %
HCT: 38.6 % (ref 36.0–46.0)
Hemoglobin: 11.5 g/dL — ABNORMAL LOW (ref 12.0–15.0)
Immature Granulocytes: 1 %
Lymphocytes Relative: 7 %
Lymphs Abs: 0.8 10*3/uL (ref 0.7–4.0)
MCH: 26.9 pg (ref 26.0–34.0)
MCHC: 29.8 g/dL — ABNORMAL LOW (ref 30.0–36.0)
MCV: 90.2 fL (ref 80.0–100.0)
MONOS PCT: 7 %
Monocytes Absolute: 0.7 10*3/uL (ref 0.1–1.0)
Neutro Abs: 9.5 10*3/uL — ABNORMAL HIGH (ref 1.7–7.7)
Neutrophils Relative %: 84 %
Platelets: 309 10*3/uL (ref 150–400)
RBC: 4.28 MIL/uL (ref 3.87–5.11)
RDW: 13.4 % (ref 11.5–15.5)
WBC: 11.1 10*3/uL — ABNORMAL HIGH (ref 4.0–10.5)
nRBC: 0 % (ref 0.0–0.2)

## 2018-05-27 LAB — BASIC METABOLIC PANEL
Anion gap: 10 (ref 5–15)
BUN: 11 mg/dL (ref 8–23)
CO2: 24 mmol/L (ref 22–32)
Calcium: 10.6 mg/dL — ABNORMAL HIGH (ref 8.9–10.3)
Chloride: 106 mmol/L (ref 98–111)
Creatinine, Ser: 0.74 mg/dL (ref 0.44–1.00)
GFR calc Af Amer: >60 mL/min (ref >60)
GFR calc non Af Amer: >60 mL/min (ref >60)
Glucose, Bld: 112 mg/dL — ABNORMAL HIGH (ref 70–99)
POTASSIUM: 3.6 mmol/L (ref 3.5–5.1)
Sodium: 140 mmol/L (ref 135–145)

## 2018-05-27 LAB — TYPE AND SCREEN
ABO/RH(D): A POS
Antibody Screen: NEGATIVE

## 2018-05-27 LAB — ABO/RH: ABO/RH(D): A POS

## 2018-05-27 MED ORDER — FENTANYL CITRATE (PF) 100 MCG/2ML IJ SOLN
50.0000 ug | Freq: Once | INTRAMUSCULAR | Status: AC
  Administered 2018-05-27: 50 ug via INTRAVENOUS
  Filled 2018-05-27: qty 2

## 2018-05-27 MED ORDER — ONDANSETRON HCL 4 MG/2ML IJ SOLN
4.0000 mg | Freq: Once | INTRAMUSCULAR | Status: AC
  Administered 2018-05-27: 4 mg via INTRAVENOUS
  Filled 2018-05-27: qty 2

## 2018-05-27 MED ORDER — SODIUM CHLORIDE 0.9 % IV BOLUS
500.0000 mL | Freq: Once | INTRAVENOUS | Status: AC
  Administered 2018-05-27: 500 mL via INTRAVENOUS

## 2018-05-27 NOTE — ED Notes (Signed)
XR at bedside

## 2018-05-27 NOTE — ED Triage Notes (Signed)
Pt here from home due to increased pain in the right hip.  Pt has surgery scheduled for tomorrow by Alain Marion MD.  A&Ox4

## 2018-05-27 NOTE — ED Provider Notes (Signed)
[facility] EMERGENCY DEPARTMENT Provider Note   CSN: [id number] Arrival date & time: 05/27/18  1916    History   Chief Complaint Chief Complaint  Patient presents with  . Hip Pain    HPI Tracie Brooks is a 73 y.o. female.     Status post fall earlier today resulting in right hip pain.  Patient was initially seen by a chiropractor, but then was referred to a local orthopedic surgeon Dr. Fredonia Highland.  He obtained an x-ray in the office.  She apparently has a right hip fracture.  (Unknown morphology).  Patient was sent to the emergency department for pain management and a total hip replacement on Friday by Dr Percell Miller.  No other complaints at this time.  Severity of pain is moderate.  Ambulation and palpation makes pain worse.     Past Medical History:  Diagnosis Date  . Allergy   . Asthma   . Chest pain   . Dyslipidemia   . Hyperlipidemia   . Hypertension   . IBS (irritable bowel syndrome)     Patient Active Problem List   Diagnosis Date Noted  . Osteopenia 12/28/2015  . Hypercalcemia 09/04/2015  . Colon cancer screening 08/29/2015  . Arthritis 06/24/2012  . NASH (nonalcoholic steatohepatitis) 07/15/2011  . DYSLIPIDEMIA 12/05/2008  . Essential hypertension 12/05/2008  . Asthma 12/05/2008  . CHEST PAIN 12/05/2008    Past Surgical History:  Procedure Laterality Date  . ABDOMINAL HYSTERECTOMY    . BIOPSY BREAST  1985  . COLONOSCOPY    . COLONOSCOPY N/A 09/03/2015   Procedure: COLONOSCOPY;  Surgeon: Danie Binder, MD;  Location: [facility];  Service: Endoscopy;  Laterality: N/A;  1400  . KNEE ARTHROSCOPY     right 1982     OB History   No obstetric history on file.      Home Medications    Prior to Admission medications   Medication Sig Start Date End Date Taking? Authorizing Provider  acetaminophen (TYLENOL) 325 MG tablet Take 650 mg by mouth every 6 (six) hours as needed for moderate pain.   Yes [provider]  b complex  vitamins tablet Take 1 tablet by mouth daily.   Yes [provider]  ELDERBERRY PO Take 1 tablet by mouth daily.   Yes [provider]  milk thistle 175 MG tablet Take 175 mg by mouth daily.   Yes [provider]  Multiple Vitamin (MULTIVITAMIN) capsule Take 1 capsule by mouth daily.   Yes [provider]  Omega-3 Fatty Acids (FISH OIL) 1000 MG CAPS Take by mouth daily.   Yes [provider]  OVER THE COUNTER MEDICATION Pure lvr formula BId   Yes [provider]  OVER THE COUNTER MEDICATION Pure Nic Bid with meals   Yes [provider]  OVER THE COUNTER MEDICATION D3 K2 once per day   Yes [provider]  OVER THE COUNTER MEDICATION GI Detox one - twice daily   Yes [provider]  OVER THE COUNTER MEDICATION Pro aller Bid   Yes [provider]  OVER THE COUNTER MEDICATION GLA ( Gamma Linolenic Acid) Once daily   Yes [provider]  OVER THE COUNTER MEDICATION Collagen Powder once daily   Yes [provider]  OVER THE COUNTER MEDICATION Take 1 tablet by mouth daily. Nitric Acid   Yes [provider]  OVER THE COUNTER MEDICATION Take 1 tablet by mouth daily. Cilantro Tablet  Yes [provider]  OVER THE COUNTER MEDICATION Take 1 tablet by mouth daily. Garlique Supplement   Yes [provider]  OVER THE COUNTER MEDICATION Take 1 tablet by mouth daily. Romaline   Yes [provider]  Probiotic Product (PROBIOTIC DAILY PO) Take 3 capsules by mouth daily.    Yes [provider]    Family History Family History  Problem Relation Age of Onset  . Hypertension Mother   . Hyperlipidemia Mother   . Arthritis Mother   . Hypertension Father   . Heart disease Father   . Colon polyps Neg Hx   . Colon cancer Neg Hx     Social History Social History   Tobacco Use  . Smoking status: Never Smoker  . Smokeless tobacco: Never Used  Substance Use  Topics  . Alcohol use: No  . Drug use: No     Allergies   Beta adrenergic blockers; Levsin [hyoscyamine sulfate]; Neurontin [gabapentin]; Pravastatin; Sulfa antibiotics; Vicodin [hydrocodone-acetaminophen]; Zocor [simvastatin]; Ibuprofen; and Lidocaine   Review of Systems Review of Systems  All other systems reviewed and are negative.    Physical Exam Updated Vital Signs BP (!) 167/67   Pulse 62   Temp 98 F (36.7 C) (Oral)   Resp 16   Ht 5' 6"  (1.676 m)   Wt 70.3 kg   SpO2 96%   BMI 25.02 kg/m   Physical Exam Vitals signs and nursing note reviewed.  Constitutional:      Appearance: She is well-developed.  HENT:     Head: Normocephalic and atraumatic.  Eyes:     Conjunctiva/sclera: Conjunctivae normal.  Neck:     Musculoskeletal: Neck supple.  Cardiovascular:     Rate and Rhythm: Normal rate and regular rhythm.  Pulmonary:     Effort: Pulmonary effort is normal.     Breath sounds: Normal breath sounds.  Abdominal:     General: Bowel sounds are normal.     Palpations: Abdomen is soft.  Musculoskeletal:     Comments: Right lower extremity: Tender proximal medial femur  Skin:    General: Skin is warm and dry.  Neurological:     Mental Status: She is alert and oriented to person, place, and time.  Psychiatric:        Behavior: Behavior normal.      ED Treatments / Results  Labs (all labs ordered are listed, but only abnormal results are displayed) Labs Reviewed  CBC WITH DIFFERENTIAL/PLATELET - Abnormal; Notable for the following components:      Result Value   WBC 11.1 (*)    Hemoglobin 11.5 (*)    MCHC 29.8 (*)    Neutro Abs 9.5 (*)    All other components within normal limits  BASIC METABOLIC PANEL - Abnormal; Notable for the following components:   Glucose, Bld 112 (*)    Calcium 10.6 (*)    All other components within normal limits  TYPE AND SCREEN    EKG None  Radiology No results found.  Procedures Procedures (including critical  care time)  Medications Ordered in ED Medications  sodium chloride 0.9 % bolus 500 mL (500 mLs Intravenous New Bag/Given 05/27/18 2131)  fentaNYL (SUBLIMAZE) injection 50 mcg (50 mcg Intravenous Given 05/27/18 2131)  ondansetron (ZOFRAN) injection 4 mg (4 mg Intravenous Given 05/27/18 2131)     Initial Impression / Assessment and Plan / ED Course  I have reviewed the triage vital signs and the nursing notes.  Pertinent labs &  imaging results that were available during my care of the patient were reviewed by me and considered in my medical decision making (see chart for details).        History and physical consistent with right femur fracture.  I discussed the case with the orthopedic surgeon on call for Dr. to Percell Miller.  Will admit to the hospitalist service.  Type and screen performed.  Final Clinical Impressions(s) / ED Diagnoses   Final diagnoses:  Closed fracture of shaft of right femur, unspecified fracture morphology, initial encounter [facility])    ED Discharge Orders    None       Nat Christen, MD 05/27/18 2236

## 2018-05-28 ENCOUNTER — Ambulatory Visit: Admit: 2018-05-28 | Payer: Medicare HMO | Admitting: Orthopedic Surgery

## 2018-05-28 ENCOUNTER — Encounter (HOSPITAL_COMMUNITY): Payer: Self-pay | Admitting: Family Medicine

## 2018-05-28 ENCOUNTER — Inpatient Hospital Stay (HOSPITAL_COMMUNITY): Payer: Medicare HMO | Admitting: Anesthesiology

## 2018-05-28 ENCOUNTER — Encounter (HOSPITAL_COMMUNITY)
Admission: EM | Disposition: A | Discharge status: Home Health | Payer: Self-pay | Source: Home / Self Care | Attending: Internal Medicine

## 2018-05-28 ENCOUNTER — Inpatient Hospital Stay (HOSPITAL_COMMUNITY): Payer: Medicare HMO

## 2018-05-28 DIAGNOSIS — S72001A Fracture of unspecified part of neck of right femur, initial encounter for closed fracture: Principal | ICD-10-CM

## 2018-05-28 DIAGNOSIS — I10 Essential (primary) hypertension: Secondary | ICD-10-CM

## 2018-05-28 DIAGNOSIS — J4520 Mild intermittent asthma, uncomplicated: Secondary | ICD-10-CM

## 2018-05-28 DIAGNOSIS — S72301A Unspecified fracture of shaft of right femur, initial encounter for closed fracture: Secondary | ICD-10-CM

## 2018-05-28 DIAGNOSIS — I160 Hypertensive urgency: Secondary | ICD-10-CM

## 2018-05-28 HISTORY — PX: TOTAL HIP ARTHROPLASTY: SHX124

## 2018-05-28 LAB — CBC
HCT: 33.6 % — ABNORMAL LOW (ref 36.0–46.0)
Hemoglobin: 10.7 g/dL — ABNORMAL LOW (ref 12.0–15.0)
MCH: 27.6 pg (ref 26.0–34.0)
MCHC: 31.8 g/dL (ref 30.0–36.0)
MCV: 86.8 fL (ref 80.0–100.0)
Platelets: 255 10*3/uL (ref 150–400)
RBC: 3.87 MIL/uL (ref 3.87–5.11)
RDW: 13.5 % (ref 11.5–15.5)
WBC: 9.5 10*3/uL (ref 4.0–10.5)
nRBC: 0 % (ref 0.0–0.2)

## 2018-05-28 LAB — COMPREHENSIVE METABOLIC PANEL
ALT: 24 U/L (ref 0–44)
AST: 25 U/L (ref 15–41)
Albumin: 3.6 g/dL (ref 3.5–5.0)
Alkaline Phosphatase: 90 U/L (ref 38–126)
Anion gap: 8 (ref 5–15)
BUN: 10 mg/dL (ref 8–23)
CO2: 27 mmol/L (ref 22–32)
Calcium: 10 mg/dL (ref 8.9–10.3)
Chloride: 105 mmol/L (ref 98–111)
Creatinine, Ser: 0.79 mg/dL (ref 0.44–1.00)
GFR calc non Af Amer: >60 mL/min (ref >60)
Glucose, Bld: 117 mg/dL — ABNORMAL HIGH (ref 70–99)
POTASSIUM: 3.8 mmol/L (ref 3.5–5.1)
Sodium: 140 mmol/L (ref 135–145)
Total Bilirubin: 0.7 mg/dL (ref 0.3–1.2)
Total Protein: 6.3 g/dL — ABNORMAL LOW (ref 6.5–8.1)

## 2018-05-28 LAB — SURGICAL PCR SCREEN
MRSA, PCR: NEGATIVE
Staphylococcus aureus: NEGATIVE

## 2018-05-28 SURGERY — ARTHROPLASTY, HIP, TOTAL, ANTERIOR APPROACH
Anesthesia: Spinal | Site: Hip | Laterality: Right

## 2018-05-28 MED ORDER — SORBITOL 70 % SOLN: 30.0000 mL | Freq: Every day | Status: DC | PRN

## 2018-05-28 MED ORDER — ONDANSETRON HCL 4 MG/2ML IJ SOLN
INTRAMUSCULAR | Status: DC | PRN
  Administered 2018-05-28: 4 mg via INTRAVENOUS

## 2018-05-28 MED ORDER — ACETAMINOPHEN 325 MG PO TABS: 325.0000 mg | ORAL_TABLET | Freq: Four times a day (QID) | ORAL | Status: DC | PRN

## 2018-05-28 MED ORDER — ONDANSETRON HCL 4 MG PO TABS: 4.0000 mg | ORAL_TABLET | Freq: Four times a day (QID) | ORAL | Status: DC | PRN

## 2018-05-28 MED ORDER — HYDROMORPHONE HCL 1 MG/ML IJ SOLN: 0.5000 mg | INTRAMUSCULAR | Status: DC | PRN

## 2018-05-28 MED ORDER — DEXAMETHASONE SODIUM PHOSPHATE 10 MG/ML IJ SOLN
10.0000 mg | Freq: Once | INTRAMUSCULAR | Status: AC
  Administered 2018-05-29: 10 mg via INTRAVENOUS
  Filled 2018-05-28: qty 1

## 2018-05-28 MED ORDER — METHOCARBAMOL 500 MG PO TABS
500.0000 mg | ORAL_TABLET | Freq: Four times a day (QID) | ORAL | Status: DC | PRN
  Administered 2018-05-28: 500 mg via ORAL
  Filled 2018-05-28: qty 1

## 2018-05-28 MED ORDER — BUPIVACAINE LIPOSOME 1.3 % IJ SUSP
20.0000 mL | INTRAMUSCULAR | Status: DC
  Filled 2018-05-28: qty 20

## 2018-05-28 MED ORDER — BUPIVACAINE HCL (PF) 0.25 % IJ SOLN
INTRAMUSCULAR | Status: DC | PRN
  Administered 2018-05-28: 20 mL

## 2018-05-28 MED ORDER — METHOCARBAMOL 1000 MG/10ML IJ SOLN
500.0000 mg | Freq: Four times a day (QID) | INTRAVENOUS | Status: DC | PRN
  Filled 2018-05-28: qty 5

## 2018-05-28 MED ORDER — FENTANYL CITRATE (PF) 250 MCG/5ML IJ SOLN
INTRAMUSCULAR | Status: AC
  Filled 2018-05-28: qty 5

## 2018-05-28 MED ORDER — KETOROLAC TROMETHAMINE 15 MG/ML IJ SOLN
7.5000 mg | Freq: Four times a day (QID) | INTRAMUSCULAR | Status: AC
  Administered 2018-05-28 – 2018-05-29 (×3): 7.5 mg via INTRAVENOUS
  Filled 2018-05-28 (×4): qty 1

## 2018-05-28 MED ORDER — 0.9 % SODIUM CHLORIDE (POUR BTL) OPTIME
TOPICAL | Status: DC | PRN
  Administered 2018-05-28: 1000 mL

## 2018-05-28 MED ORDER — PROPOFOL 500 MG/50ML IV EMUL
INTRAVENOUS | Status: DC | PRN
  Administered 2018-05-28: 15:00:00 via INTRAVENOUS
  Administered 2018-05-28: 50 ug/kg/min via INTRAVENOUS

## 2018-05-28 MED ORDER — SENNOSIDES-DOCUSATE SODIUM 8.6-50 MG PO TABS: 1.0000 | ORAL_TABLET | Freq: Every evening | ORAL | Status: DC | PRN

## 2018-05-28 MED ORDER — BUPIVACAINE LIPOSOME 1.3 % IJ SUSP
INTRAMUSCULAR | Status: DC | PRN
  Administered 2018-05-28: 10 mL

## 2018-05-28 MED ORDER — MIDAZOLAM HCL 2 MG/2ML IJ SOLN
INTRAMUSCULAR | Status: AC
  Filled 2018-05-28: qty 2

## 2018-05-28 MED ORDER — POVIDONE-IODINE 10 % EX SWAB: 2.0000 "application " | Freq: Once | CUTANEOUS | Status: DC

## 2018-05-28 MED ORDER — SODIUM CHLORIDE FLUSH 0.9 % IV SOLN
INTRAVENOUS | Status: DC | PRN
  Administered 2018-05-28: 20 mL

## 2018-05-28 MED ORDER — ACETAMINOPHEN 500 MG PO TABS
1000.0000 mg | ORAL_TABLET | Freq: Once | ORAL | Status: DC
  Filled 2018-05-28: qty 2

## 2018-05-28 MED ORDER — TRANEXAMIC ACID-NACL 1000-0.7 MG/100ML-% IV SOLN
INTRAVENOUS | Status: AC
  Filled 2018-05-28: qty 100

## 2018-05-28 MED ORDER — METHOCARBAMOL 500 MG PO TABS: 500.0000 mg | ORAL_TABLET | Freq: Three times a day (TID) | ORAL | 0 refills | Status: DC | PRN

## 2018-05-28 MED ORDER — ACETAMINOPHEN 500 MG PO TABS
1000.0000 mg | ORAL_TABLET | Freq: Three times a day (TID) | ORAL | Status: DC | PRN
  Administered 2018-05-28: 1000 mg via ORAL

## 2018-05-28 MED ORDER — METOCLOPRAMIDE HCL 5 MG PO TABS: 5.0000 mg | ORAL_TABLET | Freq: Three times a day (TID) | ORAL | Status: DC | PRN

## 2018-05-28 MED ORDER — BUPIVACAINE HCL (PF) 0.25 % IJ SOLN
INTRAMUSCULAR | Status: AC
  Filled 2018-05-28: qty 30

## 2018-05-28 MED ORDER — MEPERIDINE HCL 50 MG/ML IJ SOLN: 6.2500 mg | INTRAMUSCULAR | Status: DC | PRN

## 2018-05-28 MED ORDER — PROPOFOL 10 MG/ML IV BOLUS
INTRAVENOUS | Status: DC | PRN
  Administered 2018-05-28: 20 mg via INTRAVENOUS
  Administered 2018-05-28: 30 mg via INTRAVENOUS

## 2018-05-28 MED ORDER — ASPIRIN EC 81 MG PO TBEC: 81.0000 mg | DELAYED_RELEASE_TABLET | Freq: Two times a day (BID) | ORAL | 0 refills | Status: DC

## 2018-05-28 MED ORDER — ALBUMIN HUMAN 5 % IV SOLN
INTRAVENOUS | Status: DC | PRN
  Administered 2018-05-28: 14:00:00 via INTRAVENOUS

## 2018-05-28 MED ORDER — CHLORHEXIDINE GLUCONATE 4 % EX LIQD: 60.0000 mL | Freq: Once | CUTANEOUS | Status: DC

## 2018-05-28 MED ORDER — DIPHENHYDRAMINE HCL 12.5 MG/5ML PO ELIX: 12.5000 mg | ORAL_SOLUTION | ORAL | Status: DC | PRN

## 2018-05-28 MED ORDER — OXYCODONE HCL 5 MG PO TABS
5.0000 mg | ORAL_TABLET | ORAL | Status: DC | PRN
  Administered 2018-05-28: 10 mg via ORAL
  Administered 2018-05-29: 5 mg via ORAL
  Filled 2018-05-28: qty 1
  Filled 2018-05-28 (×2): qty 2

## 2018-05-28 MED ORDER — MIDAZOLAM HCL 5 MG/5ML IJ SOLN
INTRAMUSCULAR | Status: DC | PRN
  Administered 2018-05-28: 2 mg via INTRAVENOUS

## 2018-05-28 MED ORDER — METHOCARBAMOL 500 MG PO TABS: 500.0000 mg | ORAL_TABLET | Freq: Four times a day (QID) | ORAL | Status: DC | PRN

## 2018-05-28 MED ORDER — ONDANSETRON HCL 4 MG PO TABS: 4.0000 mg | ORAL_TABLET | Freq: Three times a day (TID) | ORAL | 0 refills | Status: DC | PRN

## 2018-05-28 MED ORDER — LACTATED RINGERS IV SOLN
INTRAVENOUS | Status: DC
  Administered 2018-05-28: 17:00:00 via INTRAVENOUS

## 2018-05-28 MED ORDER — CEFAZOLIN SODIUM-DEXTROSE 2-4 GM/100ML-% IV SOLN
INTRAVENOUS | Status: AC
  Filled 2018-05-28: qty 100

## 2018-05-28 MED ORDER — ONDANSETRON HCL 4 MG/2ML IJ SOLN: 4.0000 mg | Freq: Four times a day (QID) | INTRAMUSCULAR | Status: DC | PRN

## 2018-05-28 MED ORDER — CEFAZOLIN SODIUM-DEXTROSE 2-4 GM/100ML-% IV SOLN
2.0000 g | INTRAVENOUS | Status: AC
  Administered 2018-05-28: 2 g via INTRAVENOUS

## 2018-05-28 MED ORDER — MAGNESIUM CITRATE PO SOLN: 1.0000 | Freq: Once | ORAL | Status: DC | PRN

## 2018-05-28 MED ORDER — FENTANYL CITRATE (PF) 100 MCG/2ML IJ SOLN
25.0000 ug | INTRAMUSCULAR | Status: DC | PRN
  Administered 2018-05-28: 25 ug via INTRAVENOUS

## 2018-05-28 MED ORDER — LACTATED RINGERS IV SOLN
INTRAVENOUS | Status: DC
  Administered 2018-05-28: 12:00:00 via INTRAVENOUS

## 2018-05-28 MED ORDER — TRANEXAMIC ACID-NACL 1000-0.7 MG/100ML-% IV SOLN
1000.0000 mg | INTRAVENOUS | Status: AC
  Administered 2018-05-28: 1000 mg via INTRAVENOUS
  Filled 2018-05-28: qty 100

## 2018-05-28 MED ORDER — MORPHINE SULFATE (PF) 2 MG/ML IV SOLN
1.0000 mg | INTRAVENOUS | Status: DC | PRN
  Administered 2018-05-28: 2 mg via INTRAVENOUS
  Filled 2018-05-28: qty 1

## 2018-05-28 MED ORDER — DOCUSATE SODIUM 100 MG PO CAPS: 100.0000 mg | ORAL_CAPSULE | Freq: Two times a day (BID) | ORAL | 0 refills | Status: DC

## 2018-05-28 MED ORDER — ACETAMINOPHEN 500 MG PO TABS
1000.0000 mg | ORAL_TABLET | Freq: Four times a day (QID) | ORAL | Status: AC
  Administered 2018-05-28 – 2018-05-29 (×3): 1000 mg via ORAL
  Filled 2018-05-28 (×2): qty 2

## 2018-05-28 MED ORDER — HYDROMORPHONE HCL 1 MG/ML IJ SOLN: 0.2500 mg | INTRAMUSCULAR | Status: DC | PRN

## 2018-05-28 MED ORDER — BUPIVACAINE IN DEXTROSE 0.75-8.25 % IT SOLN
INTRATHECAL | Status: DC | PRN
  Administered 2018-05-28: 1.5 mL via INTRATHECAL

## 2018-05-28 MED ORDER — FENTANYL CITRATE (PF) 100 MCG/2ML IJ SOLN
INTRAMUSCULAR | Status: AC
  Filled 2018-05-28: qty 2

## 2018-05-28 MED ORDER — SODIUM CHLORIDE 0.9 % IV SOLN
INTRAVENOUS | Status: DC
  Administered 2018-05-28: 03:00:00 via INTRAVENOUS

## 2018-05-28 MED ORDER — ACETAMINOPHEN 10 MG/ML IV SOLN
INTRAVENOUS | Status: AC
  Filled 2018-05-28: qty 100

## 2018-05-28 MED ORDER — PHENYLEPHRINE HCL 10 MG/ML IJ SOLN
INTRAMUSCULAR | Status: AC
  Filled 2018-05-28: qty 1

## 2018-05-28 MED ORDER — MORPHINE SULFATE (PF) 2 MG/ML IV SOLN
2.0000 mg | INTRAVENOUS | Status: DC | PRN
  Administered 2018-05-28: 2 mg via INTRAVENOUS
  Filled 2018-05-28: qty 1

## 2018-05-28 MED ORDER — POLYETHYLENE GLYCOL 3350 17 G PO PACK: 17.0000 g | PACK | Freq: Every day | ORAL | Status: DC | PRN

## 2018-05-28 MED ORDER — METOCLOPRAMIDE HCL 5 MG/ML IJ SOLN: 10.0000 mg | Freq: Once | INTRAMUSCULAR | Status: DC | PRN

## 2018-05-28 MED ORDER — HYDRALAZINE HCL 20 MG/ML IJ SOLN: 10.0000 mg | INTRAMUSCULAR | Status: DC | PRN

## 2018-05-28 MED ORDER — METOCLOPRAMIDE HCL 5 MG/ML IJ SOLN: 5.0000 mg | Freq: Three times a day (TID) | INTRAMUSCULAR | Status: DC | PRN

## 2018-05-28 MED ORDER — PHENOL 1.4 % MT LIQD: 1.0000 | OROMUCOSAL | Status: DC | PRN

## 2018-05-28 MED ORDER — OXYCODONE HCL 5 MG PO TABS: 5.0000 mg | ORAL_TABLET | ORAL | 0 refills | Status: AC | PRN

## 2018-05-28 MED ORDER — MENTHOL 3 MG MT LOZG: 1.0000 | LOZENGE | OROMUCOSAL | Status: DC | PRN

## 2018-05-28 MED ORDER — ONDANSETRON HCL 4 MG/2ML IJ SOLN: 4.0000 mg | Freq: Once | INTRAMUSCULAR | Status: DC | PRN

## 2018-05-28 MED ORDER — SODIUM CHLORIDE 0.9 % IV SOLN
INTRAVENOUS | Status: DC | PRN
  Administered 2018-05-28: 20 ug/min via INTRAVENOUS

## 2018-05-28 MED ORDER — FENTANYL CITRATE (PF) 100 MCG/2ML IJ SOLN
INTRAMUSCULAR | Status: DC | PRN
  Administered 2018-05-28: 50 ug via INTRAVENOUS

## 2018-05-28 MED ORDER — DOCUSATE SODIUM 100 MG PO CAPS
100.0000 mg | ORAL_CAPSULE | Freq: Two times a day (BID) | ORAL | Status: DC
  Administered 2018-05-28 – 2018-05-29 (×2): 100 mg via ORAL
  Filled 2018-05-28 (×2): qty 1

## 2018-05-28 MED ORDER — CEFAZOLIN SODIUM-DEXTROSE 1-4 GM/50ML-% IV SOLN
1.0000 g | Freq: Four times a day (QID) | INTRAVENOUS | Status: AC
  Administered 2018-05-28 – 2018-05-29 (×2): 1 g via INTRAVENOUS
  Filled 2018-05-28 (×2): qty 50

## 2018-05-28 MED ORDER — ASPIRIN 81 MG PO CHEW
81.0000 mg | CHEWABLE_TABLET | Freq: Two times a day (BID) | ORAL | Status: DC
  Administered 2018-05-28 – 2018-05-29 (×2): 81 mg via ORAL
  Filled 2018-05-28 (×2): qty 1

## 2018-05-28 MED ORDER — LACTATED RINGERS IV SOLN
INTRAVENOUS | Status: DC
  Administered 2018-05-28: 22:00:00 via INTRAVENOUS

## 2018-05-28 SURGICAL SUPPLY — 55 items
BAG DECANTER FOR FLEXI CONT (MISCELLANEOUS) IMPLANT
BLADE SAG 18X100X1.27 (BLADE) IMPLANT
CLSR STERI-STRIP ANTIMIC 1/2X4 (GAUZE/BANDAGES/DRESSINGS) ×2 IMPLANT
COVER PERINEAL POST (MISCELLANEOUS) ×2 IMPLANT
COVER SURGICAL LIGHT HANDLE (MISCELLANEOUS) ×2 IMPLANT
COVER WAND RF STERILE (DRAPES) ×2 IMPLANT
DRAPE C-ARM 42X72 X-RAY (DRAPES) ×2 IMPLANT
DRAPE STERI IOBAN 125X83 (DRAPES) ×2 IMPLANT
DRAPE U-SHAPE 47X51 STRL (DRAPES) IMPLANT
DRSG MEPILEX BORDER 4X8 (GAUZE/BANDAGES/DRESSINGS) ×2 IMPLANT
DURAPREP 26ML APPLICATOR (WOUND CARE) ×2 IMPLANT
ELECT BLADE 4.0 EZ CLEAN MEGAD (MISCELLANEOUS) ×2
ELECT REM PT RETURN 9FT ADLT (ELECTROSURGICAL) ×2
ELECTRODE BLDE 4.0 EZ CLN MEGD (MISCELLANEOUS) ×1 IMPLANT
ELECTRODE REM PT RTRN 9FT ADLT (ELECTROSURGICAL) ×1 IMPLANT
FACESHIELD WRAPAROUND (MASK) ×4 IMPLANT
FACESHIELD WRAPAROUND OR TEAM (MASK) ×2 IMPLANT
GLOVE BIO SURGEON STRL SZ7.5 (GLOVE) ×4 IMPLANT
GLOVE BIOGEL PI IND STRL 8 (GLOVE) ×2 IMPLANT
GLOVE BIOGEL PI INDICATOR 8 (GLOVE) ×2
GOWN STRL REUS W/ TWL LRG LVL3 (GOWN DISPOSABLE) ×2 IMPLANT
GOWN STRL REUS W/TWL LRG LVL3 (GOWN DISPOSABLE) ×4
HEAD BIOLOX HIP 36/-5 (Joint) IMPLANT
HIP BIOLOX HD 36/-5 (Joint) ×2 IMPLANT
INSERT POLYETHYLENE 36M-0 (Insert) ×1 IMPLANT
KIT BASIN OR (CUSTOM PROCEDURE TRAY) ×2 IMPLANT
KIT TURNOVER KIT B (KITS) ×2 IMPLANT
MANIFOLD NEPTUNE II (INSTRUMENTS) ×2 IMPLANT
NDL SAFETY ECLIPSE 18X1.5 (NEEDLE) IMPLANT
NEEDLE HYPO 18GX1.5 SHARP (NEEDLE)
NEEDLE HYPO 22GX1.5 SAFETY (NEEDLE) ×2 IMPLANT
NS IRRIG 1000ML POUR BTL (IV SOLUTION) ×2 IMPLANT
PACK TOTAL JOINT (CUSTOM PROCEDURE TRAY) ×2 IMPLANT
PAD ARMBOARD 7.5X6 YLW CONV (MISCELLANEOUS) ×2 IMPLANT
SCREW HEX LP 6.5X20 (Screw) ×1 IMPLANT
SHELL TRIDENT II CLUST 50 (Shell) ×1 IMPLANT
SPONGE LAP 18X18 RF (DISPOSABLE) IMPLANT
STEM HIP 127 DEG (Stem) ×1 IMPLANT
SUT MNCRL AB 4-0 PS2 18 (SUTURE) ×2 IMPLANT
SUT VIC AB 0 CT1 27 (SUTURE) ×2
SUT VIC AB 0 CT1 27XBRD ANBCTR (SUTURE) ×1 IMPLANT
SUT VIC AB 1 CT1 27 (SUTURE) ×2
SUT VIC AB 1 CT1 27XBRD ANBCTR (SUTURE) ×1 IMPLANT
SUT VIC AB 2-0 CT1 27 (SUTURE) ×2
SUT VIC AB 2-0 CT1 TAPERPNT 27 (SUTURE) ×1 IMPLANT
SUT VLOC 180 0 24IN GS25 (SUTURE) IMPLANT
SYR 50ML LL SCALE MARK (SYRINGE) ×2 IMPLANT
SYR BULB IRRIGATION 50ML (SYRINGE) ×2 IMPLANT
SYRINGE 20CC LL (MISCELLANEOUS) IMPLANT
TOWEL OR 17X24 6PK STRL BLUE (TOWEL DISPOSABLE) ×2 IMPLANT
TOWEL OR 17X26 10 PK STRL BLUE (TOWEL DISPOSABLE) ×2 IMPLANT
TRAY CATH 16FR W/PLASTIC CATH (SET/KITS/TRAYS/PACK) IMPLANT
TRAY FOLEY MTR SLVR 16FR STAT (SET/KITS/TRAYS/PACK) IMPLANT
WATER STERILE IRR 1000ML POUR (IV SOLUTION) ×2 IMPLANT
YANKAUER SUCT BULB TIP NO VENT (SUCTIONS) ×4 IMPLANT

## 2018-05-28 NOTE — Anesthesia Preprocedure Evaluation (Addendum)
Anesthesia Evaluation  Patient identified by MRN, date of birth, ID band Patient awake    Reviewed: Allergy & Precautions, NPO status , Patient's Chart, lab work & pertinent test results  Airway Mallampati: II  TM Distance: >3 FB Neck ROM: Full    Dental no notable dental hx.    Pulmonary neg pulmonary ROS,    Pulmonary exam normal breath sounds clear to auscultation       Cardiovascular hypertension, Pt. on medications Normal cardiovascular exam Rhythm:Regular Rate:Normal     Neuro/Psych negative neurological ROS  negative psych ROS   GI/Hepatic negative GI ROS, (+) Hepatitis -  Endo/Other  negative endocrine ROS  Renal/GU negative Renal ROS     Musculoskeletal  (+) Arthritis ,   Abdominal   Peds  Hematology negative hematology ROS (+)   Anesthesia Other Findings   Reproductive/Obstetrics negative OB ROS                            Anesthesia Physical Anesthesia Plan  ASA: III  Anesthesia Plan: Spinal   Post-op Pain Management:    Induction:   PONV Risk Score and Plan: 3 and Ondansetron and Treatment may vary due to age or medical condition  Airway Management Planned: Simple Face Mask  Additional Equipment: None  Intra-op Plan:   Post-operative Plan:   Informed Consent: I have reviewed the patients History and Physical, chart, labs and discussed the procedure including the risks, benefits and alternatives for the proposed anesthesia with the patient or authorized representative who has indicated his/her understanding and acceptance.     Dental advisory given  Plan Discussed with: CRNA  Anesthesia Plan Comments:        Anesthesia Quick Evaluation

## 2018-05-28 NOTE — Progress Notes (Signed)
CSW acknowledges consult. Will follow for PT recommendations.   [place], [place]

## 2018-05-28 NOTE — Interval H&P Note (Signed)
I participated in the care of this patient and agree with the above history, physical and evaluation. I performed a review of the history and a physical exam as detailed   Carole Binning MD

## 2018-05-28 NOTE — Transfer of Care (Signed)
Immediate Anesthesia Transfer of Care Note  Patient: Tracie Brooks  Procedure(s) Performed: TOTAL HIP ARTHROPLASTY ANTERIOR APPROACH (Right Hip)  Patient Location: PACU  Anesthesia Type:MAC combined with regional for post-op pain  Level of Consciousness: awake and alert   Airway & Oxygen Therapy: Patient Spontanous Breathing and Patient connected to nasal cannula oxygen  Post-op Assessment: Report given to RN and Post -op Vital signs reviewed and stable  Post vital signs: Reviewed and stable  Last Vitals:  Vitals Value Taken Time  BP 143/76 05/28/2018  3:30 PM  Temp    Pulse 64 05/28/2018  3:35 PM  Resp 9 05/28/2018  3:35 PM  SpO2 100 % 05/28/2018  3:35 PM  Vitals shown include unvalidated device data.  Last Pain:  Vitals:   05/28/18 0515  TempSrc: Oral  PainSc:          Complications: No apparent anesthesia complications

## 2018-05-28 NOTE — H&P (View-Only) (Signed)
ORTHOPAEDIC CONSULTATION  REQUESTING PHYSICIAN: Charlynne Cousins, MD  Chief Complaint: Right hip pain, fall  HPI: Tracie Brooks is a 73 y.o. female who complains of right hip pain after fall yesterday morning.  She initially presented to her chiropractor who took an x-ray and saw fracture.  She then presented to [facility] pediatric office where x-rays showed a right femoral neck fracture.  She was instructed to present to the emergency department to be admitted for hip fracture and surgical intervention.  She did discuss her injury the pre-and postoperative course with Dr. Percell Miller in the office.  Today her pain is controlled.  Last meal yesterday afternoon. Denies history of MI, CVA, DVT, PE.  Previously ambulatory and active without aid.  The patient was living in the multi story home husband who is there all the time.   She notes gluten and sulfa allergies as well as several others but no allergy to penicillin.  She had a rash when taking topical and oral medication in conjunction with ibuprofen.  She has tolerated ibuprofen since.  Past Medical History:  Diagnosis Date  . Allergy   . Asthma   . Chest pain   . Dyslipidemia   . Hyperlipidemia   . Hypertension   . IBS (irritable bowel syndrome)    Past Surgical History:  Procedure Laterality Date  . ABDOMINAL HYSTERECTOMY    . BIOPSY BREAST  1985  . COLONOSCOPY    . COLONOSCOPY N/A 09/03/2015   Procedure: COLONOSCOPY;  Surgeon: Danie Binder, MD;  Location: [facility];  Service: Endoscopy;  Laterality: N/A;  1400  . KNEE ARTHROSCOPY     right 1982   Social History   Socioeconomic History  . Marital status: Married    Spouse name: Not on file  . Number of children: Not on file  . Years of education: Not on file  . Highest education level: Not on file  Occupational History  . Occupation: Retired  Scientific laboratory technician  . Financial resource strain: Not on file  . Food insecurity:    Worry: Not on file   Inability: Not on file  . Transportation needs:    Medical: Not on file    Non-medical: Not on file  Tobacco Use  . Smoking status: Never Smoker  . Smokeless tobacco: Never Used  Substance and Sexual Activity  . Alcohol use: No  . Drug use: No  . Sexual activity: Not on file  Lifestyle  . Physical activity:    Days per week: Not on file    Minutes per session: Not on file  . Stress: Not on file  Relationships  . Social connections:    Talks on phone: Not on file    Gets together: Not on file    Attends religious service: Not on file    Active member of club or organization: Not on file    Attends meetings of clubs or organizations: Not on file    Relationship status: Not on file  Other Topics Concern  . 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   No regular exercise   Family History  Problem Relation Age of Onset  . Hypertension Mother   . Hyperlipidemia Mother   . Arthritis Mother   . Hypertension Father   . Heart disease Father   . Colon polyps Neg Hx   . Colon cancer Neg Hx    Allergies  Allergen Reactions  .  Beta Adrenergic Blockers Swelling    swelling  . Levsin [Hyoscyamine Sulfate] Other (See Comments)    Blurred vision  . Neurontin [Gabapentin] Other (See Comments)    FOGGY BRAIN/CONSTIPATION  . Pravastatin Other (See Comments)    Joint pain  . Sulfa Antibiotics Other (See Comments)    Mom had it and pt was told never to take it   . Vicodin [Hydrocodone-Acetaminophen] Other (See Comments)    unknown  . Zocor [Simvastatin] Other (See Comments)    Joint pain  . Ibuprofen Rash  . Lidocaine Rash    cream   Prior to Admission medications   Medication Sig Start Date End Date Taking? Authorizing Provider  acetaminophen (TYLENOL) 325 MG tablet Take 650 mg by mouth every 6 (six) hours as needed for moderate pain.   Yes [provider]  b complex vitamins tablet Take 1 tablet by mouth daily.   Yes  [provider]  ELDERBERRY PO Take 1 tablet by mouth daily.   Yes [provider]  milk thistle 175 MG tablet Take 175 mg by mouth daily.   Yes [provider]  Multiple Vitamin (MULTIVITAMIN) capsule Take 1 capsule by mouth daily.   Yes [provider]  Omega-3 Fatty Acids (FISH OIL) 1000 MG CAPS Take by mouth daily.   Yes [provider]  OVER THE COUNTER MEDICATION Pure lvr formula BId   Yes [provider]  OVER THE COUNTER MEDICATION Pure Nic Bid with meals   Yes [provider]  OVER THE COUNTER MEDICATION D3 K2 once per day   Yes [provider]  OVER THE COUNTER MEDICATION GI Detox one - twice daily   Yes [provider]  OVER THE COUNTER MEDICATION Pro aller Bid   Yes [provider]  OVER THE COUNTER MEDICATION GLA ( Gamma Linolenic Acid) Once daily   Yes [provider]  OVER THE COUNTER MEDICATION Collagen Powder once daily   Yes [provider]  OVER THE COUNTER MEDICATION Take 1 tablet by mouth daily. Nitric Acid   Yes [provider]  OVER THE COUNTER MEDICATION Take 1 tablet by mouth daily. Cilantro Tablet   Yes [provider]  OVER THE COUNTER MEDICATION Take 1 tablet by mouth daily. Garlique Supplement   Yes [provider]  OVER THE COUNTER MEDICATION Take 1 tablet by mouth daily. Romaline   Yes [provider]  Probiotic Product (PROBIOTIC DAILY PO) Take 3 capsules by mouth daily.    Yes [provider]   No results found.  Positive ROS: All other systems have been reviewed and were otherwise negative with the exception of those mentioned in the HPI and as above.  Objective: Labs cbc Recent Labs    05/27/18 2136 05/28/18 0251  WBC 11.1* 9.5  HGB 11.5* 10.7*  HCT 38.6 33.6*  PLT 309 255    Labs inflam No results for input(s): CRP in the last 72 hours.  Invalid input(s): ESR  Labs coag No results for  input(s): INR, PTT in the last 72 hours.  Invalid input(s): PT  Recent Labs    05/27/18 2136 05/28/18 0251  NA 140 140  K 3.6 3.8  CL 106 105  CO2 24 27  GLUCOSE 112* 117*  BUN 11 10  CREATININE 0.74 0.79  CALCIUM 10.6* 10.0    Physical Exam: Vitals:   05/27/18 2347 05/28/18 0515  BP: (!) 171/79 (!) 151/62  Pulse: 80 69  Resp: 16  Temp: 98.5 F (36.9 C) 98.2 F (36.8 C)  SpO2: 100% 96%   General: Alert, no acute distress.  Supine in bed.  Calm, conversant. Mental status: Alert and Oriented x3 Neurologic: Speech Clear and organized, no gross focal findings or movement disorder appreciated. Respiratory: No cyanosis, no use of accessory musculature Cardiovascular: No pedal edema GI: Abdomen is soft and non-tender, non-distended. Skin: Warm and dry.  No lesions in the area of chief complaint. Extremities: Warm and well perfused w/o edema Psychiatric: Patient is competent for consent with normal mood and affect  MUSCULOSKELETAL:  RLE: Pain with range of motion right hip.  EHL, FHL, dorsiflexion, plantarflexion intact distally.  Sensation intact distally.  Feet warm. Other extremities are atraumatic with painless ROM and NVI.  Assessment / Plan: Principal Problem:   Closed right hip fracture, initial encounter [facility]) Active Problems:   Essential hypertension   Asthma   NASH (nonalcoholic steatohepatitis)   Hypercalcemia   Hypertensive urgency   Right femoral neck fracture  Plan for total hip arthroplasty, anterior approach for fracture today by Dr. Percell Miller -NPO  -Medicine team to admit and perform pre-op clearance -PT/OT post op -I expect relatively quick mobilization.  Possible discharge tomorrow or Sunday. -Smart wick Foley okay prn for comfort   The risks benefits and alternatives of the procedure were discussed with the patient including but not limited to infection, bleeding, nerve injury, the need for revision surgery, blood clots, cardiopulmonary  complications, morbidity, mortality, among others.  The patient verbalizes understanding and wishes to proceed.     Weightbearing: Bedrest.  Will amend post op. VTE prophylaxis:  SCDs.  Will amend postoperatively. Pain control:  Minimize narcotics if able. Follow-up plan:  Plan for outpatient follow up with Dr. Alain Marion in about 2 weeks. Contact information:  Edmonia Lynch MD, Roxan Hockey PA-C  Prudencio Burly III PA-C 05/28/2018 8:26 AM

## 2018-05-28 NOTE — H&P (Signed)
History and Physical    Tracie Brooks PQZ:[id number] DOB: [birth year] DOA: 05/27/2018  PCP: Mikey Kirschner, MD   Patient coming from: Home   Chief Complaint: Fall with right hip pain   HPI: Tracie Brooks is a 73 y.o. female with medical history significant for asthma, hypertension, hyperlipidemia, nonalcoholic steatohepatitis, and hypercalcemia, now presenting to emergency department with severe right hip pain after fall.  Patient reports that she had been in her usual state of health, tripped and fell, experienced immediate and severe pain at the right hip, saw her chiropractor for evaluation of this, there was concern for fracture and she was sent to orthopedic surgery clinic.  She reports that radiographs were obtained in the orthopedist clinic and she was sent to the ED for hospital admission with plan for operative repair in the morning.  She denies any in her head or losing consciousness during the fall.  She denies any chest pain or known heart disease.  She reports that her blood pressure and asthma have been well controlled with over-the-counter supplements and alternative medications.  She denies any recent fevers or chills.  She has seen endocrinology for her hypercalcemia and it was attributed to her high-dose vitamin D supplements and calcium supplementation.  She reports intolerance of traditional antihypertensives and takes nitric acid for this.  She follows with GI for NASH.  ED Course: Upon arrival to the ED, patient is found to be afebrile, saturating well on room air, hypertensive with SBP 230, and vitals otherwise normal.  Chemistry panel is notable for chronic mild hypercalcemia.  Patient refused chest x-ray and refused EKG.  Orthopedic surgery was consulted by the ED physician and recommended medical admission.  Review of Systems:  All other systems reviewed and apart from HPI, are negative.  Past Medical History:  Diagnosis Date  . Allergy   . Asthma   . Chest pain   .  Dyslipidemia   . Hyperlipidemia   . Hypertension   . IBS (irritable bowel syndrome)     Past Surgical History:  Procedure Laterality Date  . ABDOMINAL HYSTERECTOMY    . BIOPSY BREAST  1985  . COLONOSCOPY    . COLONOSCOPY N/A 09/03/2015   Procedure: COLONOSCOPY;  Surgeon: Danie Binder, MD;  Location: [facility];  Service: Endoscopy;  Laterality: N/A;  1400  . KNEE ARTHROSCOPY     right 1982     reports that she has never smoked. She has never used smokeless tobacco. She reports that she does not drink alcohol or use drugs.  Allergies  Allergen Reactions  . Beta Adrenergic Blockers Swelling    swelling  . Levsin [Hyoscyamine Sulfate] Other (See Comments)    Blurred vision  . Neurontin [Gabapentin] Other (See Comments)    FOGGY BRAIN/CONSTIPATION  . Pravastatin Other (See Comments)    Joint pain  . Sulfa Antibiotics Other (See Comments)    Mom had it and pt was told never to take it   . Vicodin [Hydrocodone-Acetaminophen] Other (See Comments)    unknown  . Zocor [Simvastatin] Other (See Comments)    Joint pain  . Ibuprofen Rash  . Lidocaine Rash    cream    Family History  Problem Relation Age of Onset  . Hypertension Mother   . Hyperlipidemia Mother   . Arthritis Mother   . Hypertension Father   . Heart disease Father   . Colon polyps Neg Hx   . Colon cancer Neg Hx  Prior to Admission medications   Medication Sig Start Date End Date Taking? Authorizing Provider  acetaminophen (TYLENOL) 325 MG tablet Take 650 mg by mouth every 6 (six) hours as needed for moderate pain.   Yes [provider]  b complex vitamins tablet Take 1 tablet by mouth daily.   Yes [provider]  ELDERBERRY PO Take 1 tablet by mouth daily.   Yes [provider]  milk thistle 175 MG tablet Take 175 mg by mouth daily.   Yes [provider]  Multiple Vitamin (MULTIVITAMIN) capsule Take 1 capsule by mouth daily.   Yes [provider]    Omega-3 Fatty Acids (FISH OIL) 1000 MG CAPS Take by mouth daily.   Yes [provider]  OVER THE COUNTER MEDICATION Pure lvr formula BId   Yes [provider]  OVER THE COUNTER MEDICATION Pure Nic Bid with meals   Yes [provider]  OVER THE COUNTER MEDICATION D3 K2 once per day   Yes [provider]  OVER THE COUNTER MEDICATION GI Detox one - twice daily   Yes [provider]  OVER THE COUNTER MEDICATION Pro aller Bid   Yes [provider]  OVER THE COUNTER MEDICATION GLA ( Gamma Linolenic Acid) Once daily   Yes [provider]  OVER THE COUNTER MEDICATION Collagen Powder once daily   Yes [provider]  OVER THE COUNTER MEDICATION Take 1 tablet by mouth daily. Nitric Acid   Yes [provider]  OVER THE COUNTER MEDICATION Take 1 tablet by mouth daily. Cilantro Tablet   Yes [provider]  OVER THE COUNTER MEDICATION Take 1 tablet by mouth daily. Garlique Supplement   Yes [provider]  OVER THE COUNTER MEDICATION Take 1 tablet by mouth daily. Romaline   Yes [provider]  Probiotic Product (PROBIOTIC DAILY PO) Take 3 capsules by mouth daily.    Yes [provider]    Physical Exam: Vitals:   05/27/18 2230 05/27/18 2245 05/27/18 2300 05/27/18 2347  BP: (!) 174/67 (!) 173/61 (!) 171/68 (!) 171/79  Pulse: 65 69 67 80  Resp: 16   16  Temp:    98.5 F (36.9 C)  TempSrc:    Oral  SpO2: 97% 98% 95% 100%  Weight:      Height:        Constitutional: NAD, calm  Eyes: PERTLA, lids and conjunctivae normal ENMT: Mucous membranes are moist. No gross deformity.   Neck: normal, no masses, no thyromegaly Respiratory: no wheezing, no cough. Normal respiratory effort. No accessory muscle use.  Cardiovascular: S1 & S2 heard, regular rate and rhythm. No significant JVD. Musculoskeletal: no clubbing / cyanosis. Right hip tender, neurovascularly intact distally.  Skin: no  significant rashes, lesions, ulcers. Warm, dry, well-perfused. Neurologic: CN 2-12 grossly intact. Sensation intact. Moving all extremities.  Psychiatric:  Alert. Oriented to person, place, and situation.     Labs on Admission: I have personally reviewed following labs and imaging studies  CBC: Recent Labs  Lab 05/27/18 2136  WBC 11.1*  NEUTROABS 9.5*  HGB 11.5*  HCT 38.6  MCV 90.2  PLT 599   Basic Metabolic Panel: Recent Labs  Lab 05/27/18 2136  NA 140  K 3.6  CL 106  CO2 24  GLUCOSE 112*  BUN 11  CREATININE 0.74  CALCIUM 10.6*   GFR: Estimated Creatinine Clearance: 59.5 mL/min (by C-G formula based on SCr of 0.74 mg/dL). Liver Function Tests: No results  for input(s): AST, ALT, ALKPHOS, BILITOT, PROT, ALBUMIN in the last 168 hours. No results for input(s): LIPASE, AMYLASE in the last 168 hours. No results for input(s): AMMONIA in the last 168 hours. Coagulation Profile: No results for input(s): INR, PROTIME in the last 168 hours. Cardiac Enzymes: No results for input(s): CKTOTAL, CKMB, CKMBINDEX, TROPONINI in the last 168 hours. BNP (last 3 results) No results for input(s): PROBNP in the last 8760 hours. HbA1C: No results for input(s): HGBA1C in the last 72 hours. CBG: No results for input(s): GLUCAP in the last 168 hours. Lipid Profile: No results for input(s): CHOL, HDL, LDLCALC, TRIG, CHOLHDL, LDLDIRECT in the last 72 hours. Thyroid Function Tests: No results for input(s): TSH, T4TOTAL, FREET4, T3FREE, THYROIDAB in the last 72 hours. Anemia Panel: No results for input(s): VITAMINB12, FOLATE, FERRITIN, TIBC, IRON, RETICCTPCT in the last 72 hours. Urine analysis:    Component Value Date/Time   COLORURINE YELLOW 11/17/2016 1110   APPEARANCEUR HAZY (A) 11/17/2016 1110   LABSPEC 1.009 11/17/2016 1110   PHURINE 7.0 11/17/2016 1110   GLUCOSEU NEGATIVE 11/17/2016 1110   HGBUR NEGATIVE 11/17/2016 1110   BILIRUBINUR NEGATIVE 11/17/2016 1110   KETONESUR  NEGATIVE 11/17/2016 1110   PROTEINUR NEGATIVE 11/17/2016 1110   UROBILINOGEN 0.2 07/15/2011 1142   NITRITE NEGATIVE 11/17/2016 1110   LEUKOCYTESUR NEGATIVE 11/17/2016 1110   Sepsis Labs: @LABRCNTIP (procalcitonin:4,lacticidven:4) ) Recent Results (from the past 240 hour(s))  Surgical pcr screen     Status: None   Collection Time: 05/27/18 11:51 PM  Result Value Ref Range Status   MRSA, PCR NEGATIVE NEGATIVE Final   Staphylococcus aureus NEGATIVE NEGATIVE Final    Comment: (NOTE) The Xpert SA Assay (FDA approved for NASAL specimens in patients 20 years of age and older), is one component of a comprehensive surveillance program. It is not intended to diagnose infection nor to guide or monitor treatment. Performed at [facility], [place] [address] [place], [place] [address]      Radiological Exams on Admission: No results found.  EKG: Patient refused.   Assessment/Plan   1. Right hip fracture  - Presents with severe right hip pain after a fall; reports she had radiographs at orthopedic clinic that revealed a right hip fracture  - Orthopedic surgery consulted by ED physician   - She refused preoperative EKG and CXR  - Continue supportive care   2. Hypertension with hypertensive urgency  - SBP 230 in ED, improved with pain-control but still elevated  - Continue pain-control, use hydralazine IVP's as needed   3. Asthma  - No cough or wheeze on admission    DVT prophylaxis: SCD's Code Status: Full  Family Communication: Discussed with patient   Consults called: orthopedic surgery  Admission status: Inpatient     Vianne Bulls, MD [facility] Pager [phone]  If 7PM-7AM, please contact night-coverage [email or web address] Password [email or web address]  05/28/2018, 2:28 AM

## 2018-05-28 NOTE — Discharge Instructions (Signed)
INSTRUCTIONS AFTER JOINT REPLACEMENT   You may bear weight as tolerated. Keep your dressing on and dry until follow up. Take medicine to prevent blood clots as directed. Take pain medicine as needed with the goal of transitioning to over the counter medicines.  If needed, you may increase breakthrough pain medication (oxycodone) for the first few days post op - up to 2 tablets every 4 hours.  Stop as this medication as soon as you are able.  o Remove items at home which could result in a fall. This includes throw rugs or furniture in walking pathways o ICE to the affected joint every three hours while awake for 30 minutes at a time, for at least the first 3-5 days, and then as needed for pain and swelling.  Continue to use ice for pain and swelling. You may notice swelling that will progress down to the foot and ankle.  This is normal after surgery.  Elevate your leg when you are not up walking on it.   o Continue to use the breathing machine you got in the hospital (incentive spirometer) which will help keep your temperature down.  It is common for your temperature to cycle up and down following surgery, especially at night when you are not up moving around and exerting yourself.  The breathing machine keeps your lungs expanded and your temperature down.   DIET:  As you were doing prior to hospitalization, we recommend a well-balanced diet.  DRESSING / WOUND CARE / SHOWERING  You may shower 3 days after surgery, but keep the wounds dry during showering.  You may use an occlusive plastic wrap (Press'n Seal for example) with blue painter's tape at edges, NO SOAKING/SUBMERGING IN THE BATHTUB.  If the bandage gets wet, change with a clean dry gauze.  If the incision gets wet, pat the wound dry with a clean towel.  ACTIVITY  o Increase activity slowly as tolerated, but follow the weight bearing instructions below.   o No driving for 6 weeks or until further direction given by your physician.  You  cannot drive while taking narcotics.  o No lifting or carrying greater than 10 lbs. until further directed by your surgeon. o Avoid periods of inactivity such as sitting longer than an hour when not asleep. This helps prevent blood clots.  o You may return to work once you are authorized by your doctor.     WEIGHT BEARING   Weight bearing as tolerated with assist device (walker, cane, etc) as directed, use it as long as suggested by your surgeon or therapist, typically at least 4-6 weeks.   EXERCISES  Results after joint replacement surgery are often greatly improved when you follow the exercise, range of motion and muscle strengthening exercises prescribed by your doctor. Safety measures are also important to protect the joint from further injury. Any time any of these exercises cause you to have increased pain or swelling, decrease what you are doing until you are comfortable again and then slowly increase them. If you have problems or questions, call your caregiver or physical therapist for advice.   Rehabilitation is important following a joint replacement. After just a few days of immobilization, the muscles of the leg can become weakened and shrink (atrophy).  These exercises are designed to build up the tone and strength of the thigh and leg muscles and to improve motion. Often times heat used for twenty to thirty minutes before working out will loosen up your tissues and help  with improving the range of motion but do not use heat for the first two weeks following surgery (sometimes heat can increase post-operative swelling).   These exercises can be done on a training (exercise) mat, on the floor, on a table or on a bed. Use whatever works the best and is most comfortable for you.    Use music or television while you are exercising so that the exercises are a pleasant break in your day. This will make your life better with the exercises acting as a break in your routine that you can look  forward to.   Perform all exercises about fifteen times, three times per day or as directed.  You should exercise both the operative leg and the other leg as well.  Exercises include:    Quad Sets - Tighten up the muscle on the front of the thigh (Quad) and hold for 5-10 seconds.    Straight Leg Raises - With your knee straight (if you were given a brace, keep it on), lift the leg to 60 degrees, hold for 3 seconds, and slowly lower the leg.  Perform this exercise against resistance later as your leg gets stronger.   Leg Slides: Lying on your back, slowly slide your foot toward your buttocks, bending your knee up off the floor (only go as far as is comfortable). Then slowly slide your foot back down until your leg is flat on the floor again.   Angel Wings: Lying on your back spread your legs to the side as far apart as you can without causing discomfort.   Hamstring Strength:  Lying on your back, push your heel against the floor with your leg straight by tightening up the muscles of your buttocks.  Repeat, but this time bend your knee to a comfortable angle, and push your heel against the floor.  You may put a pillow under the heel to make it more comfortable if necessary.   A rehabilitation program following joint replacement surgery can speed recovery and prevent re-injury in the future due to weakened muscles. Contact your doctor or a physical therapist for more information on knee rehabilitation.    CONSTIPATION  Constipation is defined medically as fewer than three stools per week and severe constipation as less than one stool per week.  Even if you have a regular bowel pattern at home, your normal regimen is likely to be disrupted due to multiple reasons following surgery.  Combination of anesthesia, postoperative narcotics, change in appetite and fluid intake all can affect your bowels.   YOU MUST use at least one of the following options; they are listed in order of increasing strength  to get the job done.  They are all available over the counter, and you may need to use some, POSSIBLY even all of these options:    Drink plenty of fluids (prune juice may be helpful) and high fiber foods Colace 100 mg by mouth twice a day  Senokot for constipation as directed and as needed Dulcolax (bisacodyl), take with full glass of water  Miralax (polyethylene glycol) once or twice a day as needed.  If you have tried all these things and are unable to have a bowel movement in the first 3-4 days after surgery call either your surgeon or your primary doctor.    If you experience loose stools or diarrhea, hold the medications until you stool forms back up.  If your symptoms do not get better within 1 week or if they get  worse, check with your doctor.  If you experience "the worst abdominal pain ever" or develop nausea or vomiting, please contact the office immediately for further recommendations for treatment.   ITCHING:  If you experience itching with your medications, try taking only a single pain pill, or even half a pain pill at a time.  You can also use Benadryl over the counter for itching or also to help with sleep.   TED HOSE STOCKINGS:  Use stockings on both legs until for at least 2 weeks or as directed by physician office. They may be removed at night for sleeping.  MEDICATIONS:  See your medication summary on the After Visit Summary that nursing will review with you.  You may have some home medications which will be placed on hold until you complete the course of blood thinner medication.  It is important for you to complete the blood thinner medication as prescribed.  PRECAUTIONS:  If you experience chest pain or shortness of breath - call 911 immediately for transfer to the hospital emergency department.   If you develop a fever greater that 101 F, purulent drainage from wound, increased redness or drainage from wound, foul odor from the wound/dressing, or calf pain - CONTACT  YOUR SURGEON.                                                   FOLLOW-UP APPOINTMENTS:  If you do not already have a post-op appointment, please call the office for an appointment to be seen by your surgeon.  Guidelines for how soon to be seen are listed in your After Visit Summary, but are typically between 1-4 weeks after surgery.  OTHER INSTRUCTIONS:     MAKE SURE YOU:   Understand these instructions.   Get help right away if you are not doing well or get worse.    Thank you for letting us be a part of your medical care team.  It is a privilege we respect greatly.  We hope these instructions will help you stay on track for a fast and full recovery!

## 2018-05-28 NOTE — Progress Notes (Addendum)
[facility] PROGRESS NOTE    Progress Note  JOYELLE SIEDLECKI  JJH:[id number] DOB: 07-12-1945 DOA: 05/27/2018 PCP: Mikey Kirschner, MD     Brief Narrative:   Tracie Brooks is an 73 y.o. female past medical history significant for asthma hypertension nonalcoholic stable hepatitis presents to the emergency room after a mechanical fall, for which she started having severe right hip pain she denies any prodromal symptoms or syncopal episodes, she was sent to the orthopedic clinic clinic, she was sent to the ED for surgical repair.  Assessment/Plan:   Closed right hip fracture, initial encounter ([place]) X-ray at the orthopedic clinic revealed right hip fracture. Orthopedic surgery was consulted and awaiting recommendations. She refused p.o. Perative EKG and chest x-ray. To her age and her comorbidities she is moderate risk, risk and benefits were explained to the patient she still refuse EKG and chest x-ray. Consult physical therapy for evaluation on 05/30/2018  Elevated blood pressure without a diagnosis of hypertension: Blood pressure above 200 on admission likely due to pain. Started on IV  narcotics for pain control, and hydralazine IV PRN. Blood pressure today is fairly controlled.     DVT prophylaxis: lovenxo Family Communication:none Disposition Plan/Barrier to D/C: SNF VS home Code Status:     Code Status Orders  (From admission, onward)         Start     Ordered   05/28/18 0219  Full code  Continuous     05/28/18 0221        Code Status History    This patient has a current code status but no historical code status.        IV Access:    Peripheral IV   Procedures and diagnostic studies:   No results found.   Medical Consultants:    None.  Anti-Infectives:   None  Subjective:    [name] no complaints she relates her pain is controlled.  Objective:    Vitals:   05/27/18 2245 05/27/18 2300 05/27/18 2347 05/28/18 0515  BP: (!) 173/61  (!) 171/68 (!) 171/79 (!) 151/62  Pulse: 69 67 80 69  Resp:   16   Temp:   98.5 F (36.9 C) 98.2 F (36.8 C)  TempSrc:   Oral Oral  SpO2: 98% 95% 100% 96%  Weight:      Height:        Intake/Output Summary (Last 24 hours) at 05/28/2018 0748 Last data filed at 05/28/2018 0303 Gross per 24 hour  Intake 500 ml  Output -  Net 500 ml   Filed Weights   05/27/18 2003  Weight: 70.3 kg    Exam: General exam: In no acute distress. Respiratory system: Good air movement and clear to auscultation. Cardiovascular system: S1 & S2 heard, RRR.   Gastrointestinal system: Abdomen is nondistended, soft and nontender.  Central nervous system: Alert and oriented. No focal neurological deficits. Extremities: No pedal edema. Skin: No rashes, lesions or ulcers Psychiatry: Judgement and insight appear normal. Mood & affect appropriate.    Data Reviewed:    Labs: Basic Metabolic Panel: Recent Labs  Lab 05/27/18 2136 05/28/18 0251  NA 140 140  K 3.6 3.8  CL 106 105  CO2 24 27  GLUCOSE 112* 117*  BUN 11 10  CREATININE 0.74 0.79  CALCIUM 10.6* 10.0   GFR Estimated Creatinine Clearance: 59.5 mL/min (by C-G formula based on SCr of 0.79 mg/dL). Liver Function Tests: Recent Labs  Lab 05/28/18 0251  AST  25  ALT 24  ALKPHOS 90  BILITOT 0.7  PROT 6.3*  ALBUMIN 3.6   No results for input(s): LIPASE, AMYLASE in the last 168 hours. No results for input(s): AMMONIA in the last 168 hours. Coagulation profile No results for input(s): INR, PROTIME in the last 168 hours.  CBC: Recent Labs  Lab 05/27/18 2136 05/28/18 0251  WBC 11.1* 9.5  NEUTROABS 9.5*  --   HGB 11.5* 10.7*  HCT 38.6 33.6*  MCV 90.2 86.8  PLT 309 255   Cardiac Enzymes: No results for input(s): CKTOTAL, CKMB, CKMBINDEX, TROPONINI in the last 168 hours. BNP (last 3 results) No results for input(s): PROBNP in the last 8760 hours. CBG: No results for input(s): GLUCAP in the last 168 hours. D-Dimer: No results  for input(s): DDIMER in the last 72 hours. Hgb A1c: No results for input(s): HGBA1C in the last 72 hours. Lipid Profile: No results for input(s): CHOL, HDL, LDLCALC, TRIG, CHOLHDL, LDLDIRECT in the last 72 hours. Thyroid function studies: No results for input(s): TSH, T4TOTAL, T3FREE, THYROIDAB in the last 72 hours.  Invalid input(s): FREET3 Anemia work up: No results for input(s): VITAMINB12, FOLATE, FERRITIN, TIBC, IRON, RETICCTPCT in the last 72 hours. Sepsis Labs: Recent Labs  Lab 05/27/18 2136 05/28/18 0251  WBC 11.1* 9.5   Microbiology Recent Results (from the past 240 hour(s))  Surgical pcr screen     Status: None   Collection Time: 05/27/18 11:51 PM  Result Value Ref Range Status   MRSA, PCR NEGATIVE NEGATIVE Final   Staphylococcus aureus NEGATIVE NEGATIVE Final    Comment: (NOTE) The Xpert SA Assay (FDA approved for NASAL specimens in patients 43 years of age and older), is one component of a comprehensive surveillance program. It is not intended to diagnose infection nor to guide or monitor treatment. Performed at [facility], [place] [address], [place], [place] [address]      Medications:    Continuous Infusions: . sodium chloride 90 mL/hr at 05/28/18 0303  . methocarbamol (ROBAXIN) IV        LOS: 1 day   Charlynne Cousins  [facility]  05/28/2018, 7:48 AM

## 2018-05-28 NOTE — Anesthesia Procedure Notes (Signed)
Procedure Name: MAC Date/Time: 05/28/2018 1:19 PM Performed by: Renato Shin, CRNA Pre-anesthesia Checklist: Patient identified, Emergency Drugs available, Suction available and Patient being monitored Patient Re-evaluated:Patient Re-evaluated prior to induction Oxygen Delivery Method: Circle system utilized Preoxygenation: Pre-oxygenation with 100% oxygen Induction Type: IV induction Placement Confirmation: positive ETCO2 and breath sounds checked- equal and bilateral Dental Injury: Teeth and Oropharynx as per pre-operative assessment

## 2018-05-28 NOTE — Anesthesia Procedure Notes (Signed)
Spinal  Patient location during procedure: OR Staffing Anesthesiologist: [name], MD Performed: anesthesiologist  Preanesthetic Checklist Completed: patient identified, site marked, surgical consent, pre-op evaluation, timeout performed, IV checked, risks and benefits discussed and monitors and equipment checked Spinal Block Patient position: sitting Prep: Betadine Patient monitoring: heart rate, continuous pulse ox and blood pressure Approach: left paramedian Location: L3-4 Injection technique: single-shot Needle Needle type: Sprotte  Needle gauge: 24 G Needle length: 9 cm Additional Notes Expiration date of kit checked and confirmed. Patient tolerated procedure well, without complications.

## 2018-05-28 NOTE — Progress Notes (Signed)
Nutrition Brief Note  Received nutrition consult from the hip fracture protocol. Patient with no recent weight loss per review of weight encounters. Per malnutrition screening tool completed on admission, patient had no nutrition issues PTA.   Body mass index is 25.02 kg/m. Patient meets criteria for normal weight based on current BMI.   Current diet order is NPO for surgery. Labs and medications reviewed.   No nutrition interventions warranted at this time. If nutrition issues arise, please consult RD.   Molli Barrows, RD, LDN, [place] Pager [phone] After Hours Pager [phone]

## 2018-05-28 NOTE — Op Note (Signed)
05/27/2018 - 05/28/2018  2:39 PM  PATIENT:  Tracie Brooks   MRN: [id number]  PRE-OPERATIVE DIAGNOSIS:  Right Hip Fracture  POST-OPERATIVE DIAGNOSIS:  Right Hip Fracture  PROCEDURE:  Procedure(s): TOTAL HIP ARTHROPLASTY ANTERIOR APPROACH  PREOPERATIVE INDICATIONS:    Tracie Brooks is an 73 y.o. female who has a diagnosis of Closed right hip fracture, initial encounter [facility]) and elected for surgical management after failing conservative treatment.  The risks benefits and alternatives were discussed with the patient including but not limited to the risks of nonoperative treatment, versus surgical intervention including infection, bleeding, nerve injury, periprosthetic fracture, the need for revision surgery, dislocation, leg length discrepancy, blood clots, cardiopulmonary complications, morbidity, mortality, among others, and they were willing to proceed.     OPERATIVE REPORT     SURGEON:   Renette Butters, MD    ASSISTANT:  Roxan Hockey, PA-C, he was present and scrubbed throughout the case, critical for completion in a timely fashion, and for retraction, instrumentation, and closure.     ANESTHESIA:  General    COMPLICATIONS:  None.     COMPONENTS:  [employer]ker acolade fit femur size 5 with a 36 mm -5 head ball and a PSL acetabular shell size 50 with a  polyethylene liner    PROCEDURE IN DETAIL:   The patient was met in the holding area and  identified.  The appropriate hip was identified and marked at the operative site.  The patient was then transported to the OR  and  placed under anesthesia per that record.  At that point, the patient was  placed in the supine position and  secured to the operating room table and all bony prominences padded. He received pre-operative antibiotics    The operative lower extremity was prepped from the iliac crest to the distal leg.  Sterile draping was performed.  Time out was performed prior to incision.      Skin incision was made just 2 cm lateral  to the ASIS  extending in line with the tensor fascia lata. Electrocautery was used to control all bleeders. I dissected down sharply to the fascia of the tensor fascia lata was confirmed that the muscle fibers beneath were running posteriorly. I then incised the fascia over the superficial tensor fascia lata in line with the incision. The fascia was elevated off the anterior aspect of the muscle the muscle was retracted posteriorly and protected throughout the case. I then used electrocautery to incise the tensor fascia lata fascia control and all bleeders. Immediately visible was the fat over top of the anterior neck and capsule.  I removed the anterior fat from the capsule and elevated the rectus muscle off of the anterior capsule. I then removed a large time of capsule. The retractors were then placed over the anterior acetabulum as well as around the superior and inferior neck.  I then removed a section of the femoral neck and a napkin ring fashion around the fracture. Then used the power course to remove the femoral head from the acetabulum and thoroughly irrigated the acetabulum. I sized the femoral head.    I then exposed the deep acetabulum, cleared out any tissue including the ligamentum teres.   After adequate visualization, I excised the labrum, and then sequentially reamed.  I then impacted the acetabular implant into place using fluoroscopy for guidance.  Appropriate version and inclination was confirmed clinically matching their bony anatomy, and with fluoroscopy.  I placed a 20 mm screw in the  posterior/superio position with an excellent bite.    I then placed the polyethylene liner in place  I then adducted the leg and released the external rotators from the posterior femur allowing it to be easily delivered up lateral and anterior to the acetabulum for preparation of the femoral canal.    I then prepared the proximal femur using the cookie-cutter and then sequentially reamed and  broached.  A trial broach, neck, and head was utilized, and I reduced the hip and used floroscopy to assess the neck length and femoral implant.  I then impacted the femoral prosthesis into place into the appropriate version. The hip was then reduced and fluoroscopy confirmed appropriate position. Leg lengths were restored.  I then irrigated the hip copiously again with, and repaired the fascia with Vicryl, followed by monocryl for the subcutaneous tissue, Monocryl for the skin, Steri-Strips and sterile gauze. The patient was then awakened and returned to PACU in stable and satisfactory condition. There were no complications.  POST OPERATIVE PLAN: WBAT, DVT px: SCD's/TED, ambulation and chemical dvt px  Edmonia Lynch, MD Orthopedic Surgeon [phone]

## 2018-05-28 NOTE — Progress Notes (Signed)
Report was called to [facility] in Short Stay.  The patient is ready for surgery.

## 2018-05-28 NOTE — Consult Note (Signed)
ORTHOPAEDIC CONSULTATION  REQUESTING PHYSICIAN: Charlynne Cousins, MD  Chief Complaint: Right hip pain, fall  HPI: Tracie Brooks is a 73 y.o. female who complains of right hip pain after fall yesterday morning.  She initially presented to her chiropractor who took an x-ray and saw fracture.  She then presented to [facility] the pediatric office where x-rays showed a right femoral neck fracture.  She was instructed to present to the emergency department to be admitted for hip fracture and surgical intervention.  She did discuss her injury the pre-and postoperative course with Dr. Percell Miller in the office.  Today her pain is controlled.  Last meal yesterday afternoon. Denies history of MI, CVA, DVT, PE.  Previously ambulatory and active without aid.  The patient was living in the multi story home husband who is there all the time.   She notes gluten and sulfa allergies as well as several others but no allergy to penicillin.  She had a rash when taking topical and oral medication in conjunction with ibuprofen.  She has tolerated ibuprofen since.  Past Medical History:  Diagnosis Date  . Allergy   . Asthma   . Chest pain   . Dyslipidemia   . Hyperlipidemia   . Hypertension   . IBS (irritable bowel syndrome)    Past Surgical History:  Procedure Laterality Date  . ABDOMINAL HYSTERECTOMY    . BIOPSY BREAST  1985  . COLONOSCOPY    . COLONOSCOPY N/A 09/03/2015   Procedure: COLONOSCOPY;  Surgeon: Danie Binder, MD;  Location: [facility];  Service: Endoscopy;  Laterality: N/A;  1400  . KNEE ARTHROSCOPY     right 1982   Social History   Socioeconomic History  . Marital status: Married    Spouse name: Not on file  . Number of children: Not on file  . Years of education: Not on file  . Highest education level: Not on file  Occupational History  . Occupation: Retired  Scientific laboratory technician  . Financial resource strain: Not on file  . Food insecurity:    Worry: Not on file   Inability: Not on file  . Transportation needs:    Medical: Not on file    Non-medical: Not on file  Tobacco Use  . Smoking status: Never Smoker  . Smokeless tobacco: Never Used  Substance and Sexual Activity  . Alcohol use: No  . Drug use: No  . Sexual activity: Not on file  Lifestyle  . Physical activity:    Days per week: Not on file    Minutes per session: Not on file  . Stress: Not on file  Relationships  . Social connections:    Talks on phone: Not on file    Gets together: Not on file    Attends religious service: Not on file    Active member of club or organization: Not on file    Attends meetings of clubs or organizations: Not on file    Relationship status: Not on file  Other Topics Concern  . 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   No regular exercise   Family History  Problem Relation Age of Onset  . Hypertension Mother   . Hyperlipidemia Mother   . Arthritis Mother   . Hypertension Father   . Heart disease Father   . Colon polyps Neg Hx   . Colon cancer Neg Hx    Allergies  Allergen Reactions  .  Beta Adrenergic Blockers Swelling    swelling  . Levsin [Hyoscyamine Sulfate] Other (See Comments)    Blurred vision  . Neurontin [Gabapentin] Other (See Comments)    FOGGY BRAIN/CONSTIPATION  . Pravastatin Other (See Comments)    Joint pain  . Sulfa Antibiotics Other (See Comments)    Mom had it and pt was told never to take it   . Vicodin [Hydrocodone-Acetaminophen] Other (See Comments)    unknown  . Zocor [Simvastatin] Other (See Comments)    Joint pain  . Ibuprofen Rash  . Lidocaine Rash    cream   Prior to Admission medications   Medication Sig Start Date End Date Taking? Authorizing Provider  acetaminophen (TYLENOL) 325 MG tablet Take 650 mg by mouth every 6 (six) hours as needed for moderate pain.   Yes [provider]  b complex vitamins tablet Take 1 tablet by mouth daily.   Yes  [provider]  ELDERBERRY PO Take 1 tablet by mouth daily.   Yes [provider]  milk thistle 175 MG tablet Take 175 mg by mouth daily.   Yes [provider]  Multiple Vitamin (MULTIVITAMIN) capsule Take 1 capsule by mouth daily.   Yes [provider]  Omega-3 Fatty Acids (FISH OIL) 1000 MG CAPS Take by mouth daily.   Yes [provider]  OVER THE COUNTER MEDICATION Pure lvr formula BId   Yes [provider]  OVER THE COUNTER MEDICATION Pure Nic Bid with meals   Yes [provider]  OVER THE COUNTER MEDICATION D3 K2 once per day   Yes [provider]  OVER THE COUNTER MEDICATION GI Detox one - twice daily   Yes [provider]  OVER THE COUNTER MEDICATION Pro aller Bid   Yes [provider]  OVER THE COUNTER MEDICATION GLA ( Gamma Linolenic Acid) Once daily   Yes [provider]  OVER THE COUNTER MEDICATION Collagen Powder once daily   Yes [provider]  OVER THE COUNTER MEDICATION Take 1 tablet by mouth daily. Nitric Acid   Yes [provider]  OVER THE COUNTER MEDICATION Take 1 tablet by mouth daily. Cilantro Tablet   Yes [provider]  OVER THE COUNTER MEDICATION Take 1 tablet by mouth daily. Garlique Supplement   Yes [provider]  OVER THE COUNTER MEDICATION Take 1 tablet by mouth daily. Romaline   Yes [provider]  Probiotic Product (PROBIOTIC DAILY PO) Take 3 capsules by mouth daily.    Yes [provider]   No results found.  Positive ROS: All other systems have been reviewed and were otherwise negative with the exception of those mentioned in the HPI and as above.  Objective: Labs cbc Recent Labs    05/27/18 2136 05/28/18 0251  WBC 11.1* 9.5  HGB 11.5* 10.7*  HCT 38.6 33.6*  PLT 309 255    Labs inflam No results for input(s): CRP in the last 72 hours.  Invalid input(s): ESR  Labs coag No results for  input(s): INR, PTT in the last 72 hours.  Invalid input(s): PT  Recent Labs    05/27/18 2136 05/28/18 0251  NA 140 140  K 3.6 3.8  CL 106 105  CO2 24 27  GLUCOSE 112* 117*  BUN 11 10  CREATININE 0.74 0.79  CALCIUM 10.6* 10.0    Physical Exam: Vitals:   05/27/18 2347 05/28/18 0515  BP: (!) 171/79 (!) 151/62  Pulse: 80 69  Resp: 16  Temp: 98.5 F (36.9 C) 98.2 F (36.8 C)  SpO2: 100% 96%   General: Alert, no acute distress.  Supine in bed.  Calm, conversant. Mental status: Alert and Oriented x3 Neurologic: Speech Clear and organized, no gross focal findings or movement disorder appreciated. Respiratory: No cyanosis, no use of accessory musculature Cardiovascular: No pedal edema GI: Abdomen is soft and non-tender, non-distended. Skin: Warm and dry.  No lesions in the area of chief complaint. Extremities: Warm and well perfused w/o edema Psychiatric: Patient is competent for consent with normal mood and affect  MUSCULOSKELETAL:  RLE: Pain with range of motion right hip.  EHL, FHL, dorsiflexion, plantarflexion intact distally.  Sensation intact distally.  Feet warm. Other extremities are atraumatic with painless ROM and NVI.  Assessment / Plan: Principal Problem:   Closed right hip fracture, initial encounter [facility]) Active Problems:   Essential hypertension   Asthma   NASH (nonalcoholic steatohepatitis)   Hypercalcemia   Hypertensive urgency   Right femoral neck fracture  Plan for total hip arthroplasty, anterior approach for fracture today by Dr. Percell Miller -NPO  -Medicine team to admit and perform pre-op clearance -PT/OT post op -I expect relatively quick mobilization.  Possible discharge tomorrow or Sunday. -Smart wick Foley okay prn for comfort   The risks benefits and alternatives of the procedure were discussed with the patient including but not limited to infection, bleeding, nerve injury, the need for revision surgery, blood clots, cardiopulmonary  complications, morbidity, mortality, among others.  The patient verbalizes understanding and wishes to proceed.     Weightbearing: Bedrest.  Will amend post op. VTE prophylaxis:  SCDs.  Will amend postoperatively. Pain control:  Minimize narcotics if able. Follow-up plan:  Plan for outpatient follow up with Dr. Alain Marion in about 2 weeks. Contact information:  Edmonia Lynch MD, Roxan Hockey PA-C  Prudencio Burly III PA-C 05/28/2018 8:26 AM

## 2018-05-29 DIAGNOSIS — M25551 Pain in right hip: Secondary | ICD-10-CM

## 2018-05-29 DIAGNOSIS — R030 Elevated blood-pressure reading, without diagnosis of hypertension: Secondary | ICD-10-CM

## 2018-05-29 LAB — CBC
HCT: 28.5 % — ABNORMAL LOW (ref 36.0–46.0)
Hemoglobin: 8.7 g/dL — ABNORMAL LOW (ref 12.0–15.0)
MCH: 27.4 pg (ref 26.0–34.0)
MCHC: 30.5 g/dL (ref 30.0–36.0)
MCV: 89.6 fL (ref 80.0–100.0)
Platelets: 207 10*3/uL (ref 150–400)
RBC: 3.18 MIL/uL — ABNORMAL LOW (ref 3.87–5.11)
RDW: 13.7 % (ref 11.5–15.5)
WBC: 8.2 10*3/uL (ref 4.0–10.5)
nRBC: 0 % (ref 0.0–0.2)

## 2018-05-29 NOTE — Care Management Note (Signed)
Case Management Note  Patient Details  Name: JAIDEN WAHAB MRN: [id number] Date of Birth: 1945/09/08  Subjective/Objective:  73 yo F sustained R hip fx after falling. She is s/p R THA.               Action/Plan: Received referral to assist with [facility] and DME.    Expected Discharge Date:  05/29/18               Expected Discharge Plan:  [place]  In-House Referral:     Discharge planning Services  CM Consult  Post Acute Care Choice:  Durable Medical Equipment, Home Health Choice offered to:  Patient  DME Arranged:  [place] [facility] DME Agency:  [place] Arranged:  PT [facility] Agency:  [place]  Status of Service:  Completed, signed off  If discussed at [place] of Stay Meetings, dates discussed:    Additional Comments: Met with pt and husband. She plans to return home with the support of her husband. PT is recommending a RW. Discussed preference for a [facility] agency. Provided pt with a list of [place] agencies. She chose [facility]. Contacted Brenton Grills and James at [facility] for referrals.  Norina Buzzard, RN 05/29/2018, 2:45 PM

## 2018-05-29 NOTE — Progress Notes (Signed)
Orthopaedic Trauma Progress Note  S: Doing well, no issues overnight. Pain controlled. Anxious to get up with therapy  O:  Vitals:   05/28/18 2232 05/29/18 0423  BP: (!) 133/54 (!) 160/68  Pulse: 66 71  Resp: 19 18  Temp: 98.2 F (36.8 C) 98.7 F (37.1 C)  SpO2: 95% 97%    Gen: NAD, AAOx3 RLE: Dressing clean, dry and intact. Neurovascularly intact  Imaging: No postop imaging  Labs:  Results for orders placed or performed during the hospital encounter of 05/27/18 (from the past 24 hour(s))  CBC     Status: Abnormal   Collection Time: 05/29/18  3:17 AM  Result Value Ref Range   WBC 8.2 4.0 - 10.5 K/uL   RBC 3.18 (L) 3.87 - 5.11 MIL/uL   Hemoglobin 8.7 (L) 12.0 - 15.0 g/dL   HCT 28.5 (L) 36.0 - 46.0 %   MCV 89.6 80.0 - 100.0 fL   MCH 27.4 26.0 - 34.0 pg   MCHC 30.5 30.0 - 36.0 g/dL   RDW 13.7 11.5 - 15.5 %   Platelets 207 150 - 400 K/uL   nRBC 0.0 0.0 - 0.2 %    Assessment: 73 yo female s/p fall  Injuries: R femoral neck fracture s/p right total hip arthroplasty  Weightbearing: WBAT  Insicional and dressing care: per Dr. Percell Miller  Orthopedic device(s):None  CV/Blood loss: Acute blood loss anemia, Hgb 8.7 this AM, hemodynamically stable, no role for transfusion  Pain management: 1. Tylenol scheduled 2. Dilaudid 0.47m q 4 hours PRN 3. Toradol 7.516mq 6 hours scheduled 4. Robaxin 50033m 6hours PRN 5. Oxycodone 5-10 mg q 4 hours PRN  VTE prophylaxis: ASA 81 mg BID  ID: Postop ancef completed  Foley/Lines: KVO IVF  Medical co-morbidities: HTN, home meds  Dispo: PT/OT eval, likely home with HH PT  Follow - up plan: Follow up per Dr. MurJamison Okacommendatons   [facility] ([phone])

## 2018-05-29 NOTE — Discharge Summary (Signed)
Physician Discharge Summary  Tracie Brooks YQM:[id number] DOB: [birth year] DOA: 05/27/2018  PCP: Mikey Kirschner, MD  Admit date: 05/27/2018 Discharge date: 05/29/2018  Admitted From: Home Disposition:  Home  Recommendations for Outpatient Follow-up:  1. Follow up with ortho in 1-2 weeks 2. Please obtain BMP/CBC in one week 3. Home with home health and PT  Home Health: Yes Equipment/Devices:none  Discharge Condition:Stable CODE STATUS:Full Diet recommendation: Heart Healthy   Brief/Interim Summary: 73 y.o. female past medical history significant for asthma hypertension nonalcoholic stable hepatitis presents to the emergency room after a mechanical fall, for which she started having severe right hip pain she denies any prodromal symptoms or syncopal episodes, she was sent to the orthopedic clinic clinic, she was sent to the ED for surgical repair.  Discharge Diagnoses:  Principal Problem:   Closed right hip fracture, initial encounter ([place]) Active Problems:   Blood pressure elevated without history of HTN   Asthma   NASH (nonalcoholic steatohepatitis)   Hypercalcemia Closed right hip fracture, initial encounter ([place]) X-ray at the orthopedic clinic revealed right hip fracture.   Orthopedics was consulted, Status post surgical total right femoral  Arthroplasty on 05/28/2018 Physical therapy was consulted rec Home with PT. Tylenol and robaxin for pain  Elevated blood pressure without a diagnosis of hypertension: Pressure continues to improve. Elevation likely due tp pain    Discharge Instructions  Discharge Instructions    Diet - low sodium heart healthy   Complete by:  As directed    Increase activity slowly   Complete by:  As directed      Allergies as of 05/29/2018      Reactions   Beta Adrenergic Blockers Swelling   swelling   Levsin [hyoscyamine Sulfate] Other (See Comments)   Blurred vision   Neurontin [gabapentin] Other (See Comments)   FOGGY  BRAIN/CONSTIPATION   Pravastatin Other (See Comments)   Joint pain   Sulfa Antibiotics Other (See Comments)   Mom had it and pt was told never to take it    Vicodin [hydrocodone-acetaminophen] Other (See Comments)   unknown   Zocor [simvastatin] Other (See Comments)   Joint pain   Ibuprofen Rash   Lidocaine Rash   cream      Medication List    TAKE these medications   acetaminophen 325 MG tablet Commonly known as:  TYLENOL Take 650 mg by mouth every 6 (six) hours as needed for moderate pain.   aspirin EC 81 MG tablet Take 1 tablet (81 mg total) by mouth 2 (two) times daily. For DVT prophylaxis for 30 days after surgery.   b complex vitamins tablet Take 1 tablet by mouth daily.   docusate sodium 100 MG capsule Commonly known as:  COLACE Take 1 capsule (100 mg total) by mouth 2 (two) times daily. To prevent constipation while taking pain medication.   ELDERBERRY PO Take 1 tablet by mouth daily.   Fish Oil 1000 MG Caps Take by mouth daily.   methocarbamol 500 MG tablet Commonly known as:  ROBAXIN Take 1 tablet (500 mg total) by mouth every 8 (eight) hours as needed for muscle spasms.   milk thistle 175 MG tablet Take 175 mg by mouth daily.   multivitamin capsule Take 1 capsule by mouth daily.   ondansetron 4 MG tablet Commonly known as:  ZOFRAN Take 1 tablet (4 mg total) by mouth every 8 (eight) hours as needed for nausea or vomiting.   OVER THE COUNTER MEDICATION Pure lvr formula BId  OVER THE COUNTER MEDICATION Pure Nic Bid with meals   OVER THE COUNTER MEDICATION D3 K2 once per day   OVER THE COUNTER MEDICATION GI Detox one - twice daily   OVER THE COUNTER MEDICATION Pro aller Bid   OVER THE COUNTER MEDICATION GLA ( Gamma Linolenic Acid) Once daily   OVER THE COUNTER MEDICATION Collagen Powder once daily   OVER THE COUNTER MEDICATION Take 1 tablet by mouth daily. Nitric Acid   OVER THE COUNTER MEDICATION Take 1 tablet by mouth daily.  Cilantro Tablet   OVER THE COUNTER MEDICATION Take 1 tablet by mouth daily. Garlique Supplement   OVER THE COUNTER MEDICATION Take 1 tablet by mouth daily. Romaline   oxyCODONE 5 MG immediate release tablet Commonly known as:  ROXICODONE Take 1 tablet (5 mg total) by mouth every 4 (four) hours as needed for up to 30 days for breakthrough pain.   PROBIOTIC DAILY PO Take 3 capsules by mouth daily.      Follow-up Information    Schedule an appointment as soon as possible for a visit with Renette Butters, MD.   Specialty:  Orthopedic Surgery Contact information: [address] [place] [address] [phone]          Allergies  Allergen Reactions  . Beta Adrenergic Blockers Swelling    swelling  . Levsin [Hyoscyamine Sulfate] Other (See Comments)    Blurred vision  . Neurontin [Gabapentin] Other (See Comments)    FOGGY BRAIN/CONSTIPATION  . Pravastatin Other (See Comments)    Joint pain  . Sulfa Antibiotics Other (See Comments)    Mom had it and pt was told never to take it   . Vicodin [Hydrocodone-Acetaminophen] Other (See Comments)    unknown  . Zocor [Simvastatin] Other (See Comments)    Joint pain  . Ibuprofen Rash  . Lidocaine Rash    cream    Consultations:  Ortho   Procedures/Studies: Dg C-arm 1-60 Min  Result Date: 05/28/2018 CLINICAL DATA:  Right total hip replacement EXAM: OPERATIVE RIGHT HIP (WITH PELVIS IF PERFORMED) 2 VIEWS TECHNIQUE: Fluoroscopic spot image(s) were submitted for interpretation post-operatively. COMPARISON:  None. FINDINGS: Changes of right hip replacement. Normal AP alignment. No visible complicating feature. IMPRESSION: Right hip replacement.  No visible complicating feature. Electronically Signed   By: Rolm Baptise M.D.   On: 05/28/2018 17:49   Dg Hip Operative Unilat W Or W/o Pelvis Right  Result Date: 05/28/2018 CLINICAL DATA:  Right total hip replacement EXAM: OPERATIVE RIGHT HIP (WITH PELVIS IF  PERFORMED) 2 VIEWS TECHNIQUE: Fluoroscopic spot image(s) were submitted for interpretation post-operatively. COMPARISON:  None. FINDINGS: Changes of right hip replacement. Normal AP alignment. No visible complicating feature. IMPRESSION: Right hip replacement.  No visible complicating feature. Electronically Signed   By: Rolm Baptise M.D.   On: 05/28/2018 17:49   (Echo, Carotid, EGD, Colonoscopy, ERCP)    Subjective:  [place] much better than yesterday ready to gp home Discharge Exam: Vitals:   05/28/18 2232 05/29/18 0423  BP: (!) 133/54 (!) 160/68  Pulse: 66 71  Resp: 19 18  Temp: 98.2 F (36.8 C) 98.7 F (37.1 C)  SpO2: 95% 97%     General: Pt is alert, awake, not in acute distress Cardiovascular: RRR, S1/S2 +, no rubs, no gallops Respiratory: CTA bilaterally, no wheezing, no rhonchi Abdominal: Soft, NT, ND, bowel sounds + Extremities: no edema, no cyanosis    The results of significant diagnostics from this hospitalization (including imaging, microbiology,  ancillary and laboratory) are listed below for reference.     Microbiology: Recent Results (from the past 240 hour(s))  Surgical pcr screen     Status: None   Collection Time: 05/27/18 11:51 PM  Result Value Ref Range Status   MRSA, PCR NEGATIVE NEGATIVE Final   Staphylococcus aureus NEGATIVE NEGATIVE Final    Comment: (NOTE) The Xpert SA Assay (FDA approved for NASAL specimens in patients 49 years of age and older), is one component of a comprehensive surveillance program. It is not intended to diagnose infection nor to guide or monitor treatment. Performed at [facility], [place] [address], [place], [place] [address]      Labs: BNP (last 3 results) No results for input(s): BNP in the last 8760 hours. Basic Metabolic Panel: Recent Labs  Lab 05/27/18 2136 05/28/18 0251  NA 140 140  K 3.6 3.8  CL 106 105  CO2 24 27  GLUCOSE 112* 117*  BUN 11 10  CREATININE 0.74 0.79  CALCIUM 10.6* 10.0   Liver  Function Tests: Recent Labs  Lab 05/28/18 0251  AST 25  ALT 24  ALKPHOS 90  BILITOT 0.7  PROT 6.3*  ALBUMIN 3.6   No results for input(s): LIPASE, AMYLASE in the last 168 hours. No results for input(s): AMMONIA in the last 168 hours. CBC: Recent Labs  Lab 05/27/18 2136 05/28/18 0251 05/29/18 0317  WBC 11.1* 9.5 8.2  NEUTROABS 9.5*  --   --   HGB 11.5* 10.7* 8.7*  HCT 38.6 33.6* 28.5*  MCV 90.2 86.8 89.6  PLT 309 255 207   Cardiac Enzymes: No results for input(s): CKTOTAL, CKMB, CKMBINDEX, TROPONINI in the last 168 hours. BNP: Invalid input(s): POCBNP CBG: No results for input(s): GLUCAP in the last 168 hours. D-Dimer No results for input(s): DDIMER in the last 72 hours. Hgb A1c No results for input(s): HGBA1C in the last 72 hours. Lipid Profile No results for input(s): CHOL, HDL, LDLCALC, TRIG, CHOLHDL, LDLDIRECT in the last 72 hours. Thyroid function studies No results for input(s): TSH, T4TOTAL, T3FREE, THYROIDAB in the last 72 hours.  Invalid input(s): FREET3 Anemia work up No results for input(s): VITAMINB12, FOLATE, FERRITIN, TIBC, IRON, RETICCTPCT in the last 72 hours. Urinalysis    Component Value Date/Time   COLORURINE YELLOW 11/17/2016 1110   APPEARANCEUR HAZY (A) 11/17/2016 1110   LABSPEC 1.009 11/17/2016 1110   PHURINE 7.0 11/17/2016 1110   GLUCOSEU NEGATIVE 11/17/2016 1110   HGBUR NEGATIVE 11/17/2016 1110   BILIRUBINUR NEGATIVE 11/17/2016 1110   KETONESUR NEGATIVE 11/17/2016 1110   PROTEINUR NEGATIVE 11/17/2016 1110   UROBILINOGEN 0.2 07/15/2011 1142   NITRITE NEGATIVE 11/17/2016 1110   LEUKOCYTESUR NEGATIVE 11/17/2016 1110   Sepsis Labs Invalid input(s): PROCALCITONIN,  WBC,  LACTICIDVEN Microbiology Recent Results (from the past 240 hour(s))  Surgical pcr screen     Status: None   Collection Time: 05/27/18 11:51 PM  Result Value Ref Range Status   MRSA, PCR NEGATIVE NEGATIVE Final   Staphylococcus aureus NEGATIVE NEGATIVE Final     Comment: (NOTE) The Xpert SA Assay (FDA approved for NASAL specimens in patients 70 years of age and older), is one component of a comprehensive surveillance program. It is not intended to diagnose infection nor to guide or monitor treatment. Performed at [facility], [place] [address] [place], [place] [address]      Time coordinating discharge: 40 minutes  SIGNED:   Charlynne Cousins, MD  [facility]

## 2018-05-29 NOTE — Plan of Care (Signed)
  Problem: Education: Goal: Knowledge of General Education information will improve Description Including pain rating scale, medication(s)/side effects and non-pharmacologic comfort measures Outcome: Progressing   Problem: Health Behavior/Discharge Planning: Goal: Ability to manage health-related needs will improve Outcome: Progressing   

## 2018-05-29 NOTE — Progress Notes (Signed)
[facility] PROGRESS NOTE    Progress Note  Tracie Brooks  QQV:[id number] DOB: November 16, 1945 DOA: 05/27/2018 PCP: Mikey Kirschner, MD     Brief Narrative:   Tracie Brooks is an 73 y.o. female past medical history significant for asthma hypertension nonalcoholic stable hepatitis presents to the emergency room after a mechanical fall, for which she started having severe right hip pain she denies any prodromal symptoms or syncopal episodes, she was sent to the orthopedic clinic clinic, she was sent to the ED for surgical repair.  Assessment/Plan:   Closed right hip fracture, initial encounter ([place]) X-ray at the orthopedic clinic revealed right hip fracture.  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  Elevated blood pressure without a diagnosis of hypertension: Pressure continues to improve. Continue IV narcotics and Tylenol for pain.      DVT prophylaxis: lovenxo Family Communication:none Disposition Plan/Barrier to D/C: SNF VS home Code Status:     Code Status Orders  (From admission, onward)         Start     Ordered   05/28/18 0219  Full code  Continuous     05/28/18 0221        Code Status History    This patient has a current code status but no historical code status.        IV Access:    Peripheral IV   Procedures and diagnostic studies:   Dg C-arm 1-60 Min  Result Date: 05/28/2018 CLINICAL DATA:  Right total hip replacement EXAM: OPERATIVE RIGHT HIP (WITH PELVIS IF PERFORMED) 2 VIEWS TECHNIQUE: Fluoroscopic spot image(s) were submitted for interpretation post-operatively. COMPARISON:  None. FINDINGS: Changes of right hip replacement. Normal AP alignment. No visible complicating feature. IMPRESSION: Right hip replacement.  No visible complicating feature. Electronically Signed   By: Rolm Baptise M.D.   On: 05/28/2018 17:49   Dg Hip Operative Unilat W Or W/o Pelvis  Right  Result Date: 05/28/2018 CLINICAL DATA:  Right total hip replacement EXAM: OPERATIVE RIGHT HIP (WITH PELVIS IF PERFORMED) 2 VIEWS TECHNIQUE: Fluoroscopic spot image(s) were submitted for interpretation post-operatively. COMPARISON:  None. FINDINGS: Changes of right hip replacement. Normal AP alignment. No visible complicating feature. IMPRESSION: Right hip replacement.  No visible complicating feature. Electronically Signed   By: Rolm Baptise M.D.   On: 05/28/2018 17:49     Medical Consultants:    None.  Anti-Infectives:   None  Subjective:    Tracie Brooks Niece great she is hungry.  Objective:    Vitals:   05/28/18 1620 05/28/18 1643 05/28/18 2232 05/29/18 0423  BP:  128/87 (!) 133/54 (!) 160/68  Pulse:  86 66 71  Resp:  18 19 18   Temp: (!) 97.5 F (36.4 C) 98.3 F (36.8 C) 98.2 F (36.8 C) 98.7 F (37.1 C)  TempSrc:  Oral Oral Oral  SpO2:  97% 95% 97%  Weight:      Height:        Intake/Output Summary (Last 24 hours) at 05/29/2018 0909 Last data filed at 05/29/2018 0300 Gross per 24 hour  Intake 2161.88 ml  Output 850 ml  Net 1311.88 ml   Filed Weights   05/27/18 2003 05/28/18 1201  Weight: 70.3 kg 70.3 kg    Exam: General exam: In no acute distress. Respiratory system: Good air movement and clear to auscultation. Cardiovascular system: S1 & S2 heard, RRR.   Gastrointestinal system: Abdomen is nondistended, soft  and nontender.  Central nervous system: Alert and oriented. No focal neurological deficits. Extremities: No pedal edema. Skin: No rashes, lesions or ulcers Psychiatry: Judgement and insight appear normal. Mood & affect appropriate.    Data Reviewed:    Labs: Basic Metabolic Panel: Recent Labs  Lab 05/27/18 2136 05/28/18 0251  NA 140 140  K 3.6 3.8  CL 106 105  CO2 24 27  GLUCOSE 112* 117*  BUN 11 10  CREATININE 0.74 0.79  CALCIUM 10.6* 10.0   GFR Estimated Creatinine Clearance: 59.5 mL/min (by C-G formula based on SCr of 0.79  mg/dL). Liver Function Tests: Recent Labs  Lab 05/28/18 0251  AST 25  ALT 24  ALKPHOS 90  BILITOT 0.7  PROT 6.3*  ALBUMIN 3.6   No results for input(s): LIPASE, AMYLASE in the last 168 hours. No results for input(s): AMMONIA in the last 168 hours. Coagulation profile No results for input(s): INR, PROTIME in the last 168 hours.  CBC: Recent Labs  Lab 05/27/18 2136 05/28/18 0251 05/29/18 0317  WBC 11.1* 9.5 8.2  NEUTROABS 9.5*  --   --   HGB 11.5* 10.7* 8.7*  HCT 38.6 33.6* 28.5*  MCV 90.2 86.8 89.6  PLT 309 255 207   Cardiac Enzymes: No results for input(s): CKTOTAL, CKMB, CKMBINDEX, TROPONINI in the last 168 hours. BNP (last 3 results) No results for input(s): PROBNP in the last 8760 hours. CBG: No results for input(s): GLUCAP in the last 168 hours. D-Dimer: No results for input(s): DDIMER in the last 72 hours. Hgb A1c: No results for input(s): HGBA1C in the last 72 hours. Lipid Profile: No results for input(s): CHOL, HDL, LDLCALC, TRIG, CHOLHDL, LDLDIRECT in the last 72 hours. Thyroid function studies: No results for input(s): TSH, T4TOTAL, T3FREE, THYROIDAB in the last 72 hours.  Invalid input(s): FREET3 Anemia work up: No results for input(s): VITAMINB12, FOLATE, FERRITIN, TIBC, IRON, RETICCTPCT in the last 72 hours. Sepsis Labs: Recent Labs  Lab 05/27/18 2136 05/28/18 0251 05/29/18 0317  WBC 11.1* 9.5 8.2   Microbiology Recent Results (from the past 240 hour(s))  Surgical pcr screen     Status: None   Collection Time: 05/27/18 11:51 PM  Result Value Ref Range Status   MRSA, PCR NEGATIVE NEGATIVE Final   Staphylococcus aureus NEGATIVE NEGATIVE Final    Comment: (NOTE) The Xpert SA Assay (FDA approved for NASAL specimens in patients 12 years of age and older), is one component of a comprehensive surveillance program. It is not intended to diagnose infection nor to guide or monitor treatment. Performed at [facility], [address]  [place], [place] [address]      Medications:   . acetaminophen  1,000 mg Oral Once  . acetaminophen  1,000 mg Oral Q6H  . aspirin  81 mg Oral BID  . chlorhexidine  60 mL Topical Once  . chlorhexidine  60 mL Topical Once  . docusate sodium  100 mg Oral BID  . ketorolac  7.5 mg Intravenous Q6H  . povidone-iodine  2 application Topical Once   Continuous Infusions: . methocarbamol (ROBAXIN) IV        LOS: 2 days   Charlynne Cousins  [facility]  05/29/2018, 9:09 AM

## 2018-05-29 NOTE — Evaluation (Signed)
Occupational Therapy Evaluation Patient Details Name: Tracie Brooks MRN: [id number] DOB: 12/07/1945 Today's Date: 05/29/2018    History of Present Illness Pt is a 73 y.o. female who fell sustaining R hip fx. She underwent THA 05/28/18.    Clinical Impression   Pt PTA: Pt living with spouse at home and working- independent with ADL and mobility. Pt currently limited by muscle soreness in quad region. Pt performing sit to stand with proper hand placement with RW with supervisionA; transfers to commode with supervision and ambulating in hallway with RW with fair balance for ~600 feet. Pt able to donn/doff for LB dressing with no difficulty today. Pt tolerating session well. Pt denying need to be assisted for tub transfer while here so pt agreed to [facility] therapy. Pt education on techniques for tub transfer and HH to follow up as needed. No additional acute OT required at this time. OT to sign off.    Follow Up Recommendations  Home health OT;Supervision - Intermittent    Equipment Recommendations  None recommended by OT    Recommendations for Other Services       Precautions / Restrictions Precautions Precautions: Fall Precaution Comments: direct anterior approach/no precautions Restrictions Weight Bearing Restrictions: Yes RLE Weight Bearing: Weight bearing as tolerated      Mobility Bed Mobility Overal bed mobility: Needs Assistance Bed Mobility: Supine to Sit     Supine to sit: Min assist;HOB elevated     General bed mobility comments: chair upon arrival  Transfers Overall transfer level: Needs assistance Equipment used: Rolling walker (2 wheeled) Transfers: Sit to/from [facility] Sit to Stand: Supervision Stand pivot transfers: Supervision       General transfer comment: cues for hand placement and sequencing    Balance Overall balance assessment: Needs assistance Sitting-balance support: No upper extremity supported;Feet supported Sitting  balance-Leahy Scale: Good     Standing balance support: Bilateral upper extremity supported(gently using RW) Standing balance-Leahy Scale: Fair Standing balance comment: reliant on RW                           ADL either performed or assessed with clinical judgement   ADL Overall ADL's : At baseline                                       General ADL Comments: Pt requires increased time for ADL, but pt performing LB dressing with proper technique and ability with slight soreness. pt education on not crossing legs and point toe out for ADL. pt's spouse attentive to care and plans to be 24/7 care. pt modified independent with UB ADL and supervisionA or LB ADL     Vision Baseline Vision/History: Wears glasses Vision Assessment?: No apparent visual deficits     Perception     Praxis      Pertinent Vitals/Pain Pain Assessment: 0-10 Pain Score: 2  Faces Pain Scale: Hurts a little bit Pain Location: quads during mobility Pain Descriptors / Indicators: Sore;Discomfort Pain Intervention(s): Monitored during session     Hand Dominance     Extremity/Trunk Assessment Upper Extremity Assessment Upper Extremity Assessment: Overall WFL for tasks assessed   Lower Extremity Assessment Lower Extremity Assessment: RLE deficits/detail RLE Deficits / Details: s/p THA   Cervical / Trunk Assessment Cervical / Trunk Assessment: Normal   Communication Communication Communication: No difficulties   Cognition Arousal/Alertness:  Awake/alert Behavior During Therapy: WFL for tasks assessed/performed Overall Cognitive Status: Within Functional Limits for tasks assessed                                     General Comments  Spouse present. pt ambulated 600' with RW and soreness reported. no LOB episodes.    Exercises Total Joint Exercises Ankle Circles/Pumps: AROM;Both;10 reps Quad Sets: AROM;Right;5 reps Short Arc Quad: AROM;Right;5 reps Heel  Slides: AROM;Right;5 reps Hip ABduction/ADduction: AROM;Right;5 reps   Shoulder Instructions      Home Living Family/patient expects to be discharged to:: Private residence Living Arrangements: Spouse/significant other Available Help at Discharge: Family;Available 24 hours/day Type of Home: House Home Access: Stairs to enter [employer] of Steps: 3 (deep, shallow steps) Entrance Stairs-Rails: None Home Layout: Able to live on main level with bedroom/bathroom     Bathroom Shower/Tub: Teacher, early years/pre: Standard Bathroom Accessibility: Yes   Home Equipment: Cane - single point          Prior Functioning/Environment Level of Independence: Independent        Comments: works full time. Able to work from home while she recovers.        OT Problem List: Decreased strength;Decreased activity tolerance;Pain      OT Treatment/Interventions:      OT Goals(Current goals can be found in the care plan section) Acute Rehab OT Goals Patient Stated Goal: home  OT Frequency:     Barriers to D/C:            Co-evaluation              AM-PAC OT "6 Clicks" Daily Activity     Outcome Measure Help from another person eating meals?: None Help from another person taking care of personal grooming?: None Help from another person toileting, which includes using toliet, bedpan, or urinal?: None Help from another person bathing (including washing, rinsing, drying)?: None Help from another person to put on and taking off regular upper body clothing?: None Help from another person to put on and taking off regular lower body clothing?: None 6 Click Score: 24   End of Session Equipment Utilized During Treatment: Gait belt;Rolling walker Nurse Communication: Mobility status  Activity Tolerance: Patient tolerated treatment well Patient left: in chair;with call bell/phone within reach;with family/visitor present  OT Visit Diagnosis: Unsteadiness on feet  (R26.81);Muscle weakness (generalized) (M62.81)                Time: 1638-4536 OT Time Calculation (min): 22 min Charges:  OT General Charges $OT Visit: 1 Visit OT Evaluation $OT Eval Moderate Complexity: 1 Mod  Darryl Nestle) Marsa Aris OTR/L Acute Rehabilitation Services Pager: [phone] Office: [phone]  Fredda Hammed 05/29/2018, 2:32 PM

## 2018-05-29 NOTE — Progress Notes (Signed)
Equipped delivered and pt assisted to vehicle by NT.

## 2018-05-29 NOTE — Evaluation (Signed)
Physical Therapy Evaluation Patient Details Name: Tracie Brooks MRN: [id number] DOB: 09-24-1945 Today's Date: 05/29/2018   History of Present Illness  Pt is a 73 y.o. female who fell sustaining R hip fx. She underwent THA 05/28/18.   Clinical Impression  Pt admitted with above diagnosis. Pt currently with functional limitations due to the deficits listed below (see PT Problem List). PTA pt independent and active. On eval, she required min assist bed mobility, min guard assist transfers, min guard assist ambulation 200 feet with RW and min/HHA ascend/descend 3 steps without rails. Pt performed RLE exercises in recliner. Exercise handouts provided.  Pt will benefit from skilled PT to increase their independence and safety with mobility to allow discharge to the venue listed below.       Follow Up Recommendations Home health PT;Supervision/Assistance - 24 hour    Equipment Recommendations  Rolling walker with 5" wheels    Recommendations for Other Services       Precautions / Restrictions Precautions Precautions: Fall Precaution Comments: direct anterior approach/no precautions Restrictions Weight Bearing Restrictions: Yes RLE Weight Bearing: Weight bearing as tolerated      Mobility  Bed Mobility Overal bed mobility: Needs Assistance Bed Mobility: Supine to Sit     Supine to sit: Min assist;HOB elevated     General bed mobility comments: +rail, cues for sequencing  Transfers Overall transfer level: Needs assistance Equipment used: Rolling walker (2 wheeled) Transfers: Sit to/from [facility] Sit to Stand: Min guard Stand pivot transfers: Min guard       General transfer comment: cues for hand placement and sequencing  Ambulation/Gait Ambulation/Gait assistance: Min guard Gait Distance (Feet): 200 Feet Assistive device: Rolling walker (2 wheeled) Gait Pattern/deviations: Step-to pattern;Step-through pattern;Decreased stride length Gait velocity:  decreased Gait velocity interpretation: <1.31 ft/sec, indicative of household ambulator General Gait Details: able to demonstrate fluid, step-through gait pattern with cues  Stairs Stairs: Yes Stairs assistance: Min assist Stair Management: No rails;Forwards Number of Stairs: 3 General stair comments: HHA on R, cues for sequencing  Wheelchair Mobility    Modified Rankin (Stroke Patients Only)       Balance Overall balance assessment: Needs assistance Sitting-balance support: No upper extremity supported;Feet supported Sitting balance-Leahy Scale: Good     Standing balance support: Bilateral upper extremity supported;During functional activity Standing balance-Leahy Scale: Poor Standing balance comment: reliant on RW                             Pertinent Vitals/Pain Pain Assessment: Faces Faces Pain Scale: Hurts a little bit Pain Location: R hip during mobility Pain Descriptors / Indicators: Sore;Discomfort Pain Intervention(s): Premedicated before session;Monitored during session;Repositioned    Home Living Family/patient expects to be discharged to:: Private residence Living Arrangements: Spouse/significant other Available Help at Discharge: Family;Available 24 hours/day Type of Home: House Home Access: Stairs to enter Entrance Stairs-Rails: None Entrance Stairs-Number of Steps: 3 (deep, shallow steps) Home Layout: Able to live on main level with bedroom/bathroom Home Equipment: Cane - single point      Prior Function Level of Independence: Independent         Comments: works full time. Able to work from home while she recovers.     Hand Dominance        Extremity/Trunk Assessment   Upper Extremity Assessment Upper Extremity Assessment: Overall WFL for tasks assessed    Lower Extremity Assessment Lower Extremity Assessment: RLE deficits/detail RLE Deficits / Details: s/p  THA    Cervical / Trunk Assessment Cervical / Trunk  Assessment: Normal  Communication   Communication: No difficulties  Cognition Arousal/Alertness: Awake/alert Behavior During Therapy: WFL for tasks assessed/performed Overall Cognitive Status: Within Functional Limits for tasks assessed                                        General Comments General comments (skin integrity, edema, etc.): husband present and supportive    Exercises Total Joint Exercises Ankle Circles/Pumps: AROM;Both;10 reps Quad Sets: AROM;Right;5 reps Short Arc Quad: AROM;Right;5 reps Heel Slides: AROM;Right;5 reps Hip ABduction/ADduction: AROM;Right;5 reps   Assessment/Plan    PT Assessment Patient needs continued PT services  PT Problem List Decreased balance;Decreased strength;Decreased knowledge of precautions;Pain;Decreased mobility;Decreased knowledge of use of DME;Decreased activity tolerance       PT Treatment Interventions DME instruction;Functional mobility training;Balance training;Patient/family education;Gait training;Therapeutic activities;Stair training;Therapeutic exercise    PT Goals (Current goals can be found in the Care Plan section)  Acute Rehab PT Goals Patient Stated Goal: home PT Goal Formulation: With patient/family Time For Goal Achievement: 06/12/18 Potential to Achieve Goals: Good    Frequency Min 5X/week   Barriers to discharge        Co-evaluation               AM-PAC PT "6 Clicks" Mobility  Outcome Measure Help needed turning from your back to your side while in a flat bed without using bedrails?: A Little Help needed moving from lying on your back to sitting on the side of a flat bed without using bedrails?: A Little Help needed moving to and from a bed to a chair (including a wheelchair)?: A Little Help needed standing up from a chair using your arms (e.g., wheelchair or bedside chair)?: A Little Help needed to walk in hospital room?: A Little Help needed climbing 3-5 steps with a railing? : A  Little 6 Click Score: 18    End of Session Equipment Utilized During Treatment: Gait belt Activity Tolerance: Patient tolerated treatment well Patient left: in chair;with call bell/phone within reach;with family/visitor present Nurse Communication: Mobility status PT Visit Diagnosis: Difficulty in walking, not elsewhere classified (R26.2);Pain;Other abnormalities of gait and mobility (R26.89) Pain - Right/Left: Right Pain - part of body: Hip    Time: 7353-2992 PT Time Calculation (min) (ACUTE ONLY): 31 min   Charges:   PT Evaluation $PT Eval Moderate Complexity: 1 Mod PT Treatments $Gait Training: 8-22 mins        Lorrin Goodell, PT  Office # [phone] Pager [phone]   Lorriane Shire 05/29/2018, 12:21 PM

## 2018-05-29 NOTE — Progress Notes (Signed)
Pt and spouse given oral and written discharge instructions. Peripheral IV removed and pt tolerated well. Awaiting equipment delivery.

## 2018-05-30 DIAGNOSIS — J45909 Unspecified asthma, uncomplicated: Secondary | ICD-10-CM | POA: Diagnosis not present

## 2018-05-30 DIAGNOSIS — Z96641 Presence of right artificial hip joint: Secondary | ICD-10-CM | POA: Diagnosis not present

## 2018-05-30 DIAGNOSIS — I10 Essential (primary) hypertension: Secondary | ICD-10-CM | POA: Diagnosis not present

## 2018-05-30 DIAGNOSIS — W19XXXD Unspecified fall, subsequent encounter: Secondary | ICD-10-CM | POA: Diagnosis not present

## 2018-05-30 DIAGNOSIS — Z7982 Long term (current) use of aspirin: Secondary | ICD-10-CM | POA: Diagnosis not present

## 2018-05-30 DIAGNOSIS — R739 Hyperglycemia, unspecified: Secondary | ICD-10-CM | POA: Diagnosis not present

## 2018-05-30 DIAGNOSIS — R69 Illness, unspecified: Secondary | ICD-10-CM | POA: Diagnosis not present

## 2018-05-30 DIAGNOSIS — K7581 Nonalcoholic steatohepatitis (NASH): Secondary | ICD-10-CM | POA: Diagnosis not present

## 2018-05-30 DIAGNOSIS — S72001D Fracture of unspecified part of neck of right femur, subsequent encounter for closed fracture with routine healing: Secondary | ICD-10-CM | POA: Diagnosis not present

## 2018-05-31 ENCOUNTER — Encounter (HOSPITAL_COMMUNITY): Payer: Self-pay | Admitting: Orthopedic Surgery

## 2018-05-31 NOTE — Anesthesia Postprocedure Evaluation (Signed)
Anesthesia Post Note  Patient: Tracie Brooks  Procedure(s) Performed: TOTAL HIP ARTHROPLASTY ANTERIOR APPROACH (Right Hip)     Patient location during evaluation: PACU Anesthesia Type: Spinal Level of consciousness: awake and alert Pain management: pain level controlled Vital Signs Assessment: post-procedure vital signs reviewed and stable Respiratory status: spontaneous breathing and respiratory function stable Cardiovascular status: blood pressure returned to baseline and stable Postop Assessment: no headache, no backache, spinal receding and no apparent nausea or vomiting Anesthetic complications: no    Last Vitals:  Vitals:   05/29/18 0423 05/29/18 1534  BP: (!) 160/68 (!) 168/78  Pulse: 71 74  Resp: 18 16  Temp: 37.1 C 36.8 C  SpO2: 97% 96%    Last Pain:  Vitals:   05/29/18 1534  TempSrc: Oral  PainSc:                  [name]

## 2018-06-02 DIAGNOSIS — J45909 Unspecified asthma, uncomplicated: Secondary | ICD-10-CM | POA: Diagnosis not present

## 2018-06-02 DIAGNOSIS — Z96641 Presence of right artificial hip joint: Secondary | ICD-10-CM | POA: Diagnosis not present

## 2018-06-02 DIAGNOSIS — I10 Essential (primary) hypertension: Secondary | ICD-10-CM | POA: Diagnosis not present

## 2018-06-02 DIAGNOSIS — R739 Hyperglycemia, unspecified: Secondary | ICD-10-CM | POA: Diagnosis not present

## 2018-06-02 DIAGNOSIS — Z7982 Long term (current) use of aspirin: Secondary | ICD-10-CM | POA: Diagnosis not present

## 2018-06-02 DIAGNOSIS — R69 Illness, unspecified: Secondary | ICD-10-CM | POA: Diagnosis not present

## 2018-06-02 DIAGNOSIS — K7581 Nonalcoholic steatohepatitis (NASH): Secondary | ICD-10-CM | POA: Diagnosis not present

## 2018-06-02 DIAGNOSIS — W19XXXD Unspecified fall, subsequent encounter: Secondary | ICD-10-CM | POA: Diagnosis not present

## 2018-06-02 DIAGNOSIS — S72001D Fracture of unspecified part of neck of right femur, subsequent encounter for closed fracture with routine healing: Secondary | ICD-10-CM | POA: Diagnosis not present

## 2018-06-04 DIAGNOSIS — J45909 Unspecified asthma, uncomplicated: Secondary | ICD-10-CM | POA: Diagnosis not present

## 2018-06-04 DIAGNOSIS — W19XXXD Unspecified fall, subsequent encounter: Secondary | ICD-10-CM | POA: Diagnosis not present

## 2018-06-04 DIAGNOSIS — R739 Hyperglycemia, unspecified: Secondary | ICD-10-CM | POA: Diagnosis not present

## 2018-06-04 DIAGNOSIS — R69 Illness, unspecified: Secondary | ICD-10-CM | POA: Diagnosis not present

## 2018-06-04 DIAGNOSIS — Z96641 Presence of right artificial hip joint: Secondary | ICD-10-CM | POA: Diagnosis not present

## 2018-06-04 DIAGNOSIS — K7581 Nonalcoholic steatohepatitis (NASH): Secondary | ICD-10-CM | POA: Diagnosis not present

## 2018-06-04 DIAGNOSIS — Z7982 Long term (current) use of aspirin: Secondary | ICD-10-CM | POA: Diagnosis not present

## 2018-06-04 DIAGNOSIS — I10 Essential (primary) hypertension: Secondary | ICD-10-CM | POA: Diagnosis not present

## 2018-06-04 DIAGNOSIS — S72001D Fracture of unspecified part of neck of right femur, subsequent encounter for closed fracture with routine healing: Secondary | ICD-10-CM | POA: Diagnosis not present

## 2018-06-10 DIAGNOSIS — R739 Hyperglycemia, unspecified: Secondary | ICD-10-CM | POA: Diagnosis not present

## 2018-06-10 DIAGNOSIS — Z96641 Presence of right artificial hip joint: Secondary | ICD-10-CM | POA: Diagnosis not present

## 2018-06-10 DIAGNOSIS — Z7982 Long term (current) use of aspirin: Secondary | ICD-10-CM | POA: Diagnosis not present

## 2018-06-10 DIAGNOSIS — J45909 Unspecified asthma, uncomplicated: Secondary | ICD-10-CM | POA: Diagnosis not present

## 2018-06-10 DIAGNOSIS — W19XXXD Unspecified fall, subsequent encounter: Secondary | ICD-10-CM | POA: Diagnosis not present

## 2018-06-10 DIAGNOSIS — K7581 Nonalcoholic steatohepatitis (NASH): Secondary | ICD-10-CM | POA: Diagnosis not present

## 2018-06-10 DIAGNOSIS — I10 Essential (primary) hypertension: Secondary | ICD-10-CM | POA: Diagnosis not present

## 2018-06-10 DIAGNOSIS — S72001D Fracture of unspecified part of neck of right femur, subsequent encounter for closed fracture with routine healing: Secondary | ICD-10-CM | POA: Diagnosis not present

## 2018-06-10 DIAGNOSIS — R69 Illness, unspecified: Secondary | ICD-10-CM | POA: Diagnosis not present

## 2018-06-11 DIAGNOSIS — S72001D Fracture of unspecified part of neck of right femur, subsequent encounter for closed fracture with routine healing: Secondary | ICD-10-CM | POA: Diagnosis not present

## 2018-06-15 DIAGNOSIS — S72001D Fracture of unspecified part of neck of right femur, subsequent encounter for closed fracture with routine healing: Secondary | ICD-10-CM | POA: Diagnosis not present

## 2018-06-22 DIAGNOSIS — S72001D Fracture of unspecified part of neck of right femur, subsequent encounter for closed fracture with routine healing: Secondary | ICD-10-CM | POA: Diagnosis not present

## 2018-07-07 DIAGNOSIS — S72001D Fracture of unspecified part of neck of right femur, subsequent encounter for closed fracture with routine healing: Secondary | ICD-10-CM | POA: Diagnosis not present

## 2018-08-09 ENCOUNTER — Telehealth: Payer: Self-pay | Admitting: Cardiology

## 2018-08-09 ENCOUNTER — Encounter: Payer: Self-pay | Admitting: Cardiology

## 2018-08-09 NOTE — Progress Notes (Signed)
Virtual Visit via Video Note   This visit type was conducted due to national recommendations for restrictions regarding the COVID-19 Pandemic (e.g. social distancing) in an effort to limit this patient's exposure and mitigate transmission in our community.  Due to her co-morbid illnesses, this patient is at least at moderate risk for complications without adequate follow up.  This format is felt to be most appropriate for this patient at this time.  All issues noted in this document were discussed and addressed.  A limited physical exam was performed with this format.  Please refer to the patient's chart for her consent to telehealth for [facility].   Date:  08/10/2018   ID:  Tracie Brooks, DOB 1945-08-20, MRN [id number]  Patient Location: Home Provider Location: Home  PCP:  Mikey Kirschner, MD Modena Nunnery MD Cardiologist:  Dr Stanford Breed  Evaluation Performed:  Consultation - Tracie Brooks was referred by Modena Nunnery MD for the evaluation of hypertension, murmur and dizziness.  Chief Complaint: hypertension, murmur and dizziness  History of Present Illness:    Tracie Brooks is a 73 y.o. female with past medical history of hypertension, hyperlipidemia for evaluation of dizziness, murmur and hypertension.  Patient states she has had hypertension in the past but controlled with risk factor modification.  She states beta-blockers caused swelling in the past and she discontinued.  She states hydrochlorothiazide was not tolerated because of bone issues.  She typically does not have dyspnea on exertion, orthopnea, PND, pedal edema, chest pain or palpitations.  Last week she had several episodes of dizziness lasting several minutes.  No associated symptoms of palpitations, chest pain, nausea or dyspnea.  She did not have loss of strength or sensation in her extremities.  She was placed on prednisone for question inflammatory component.  She remains somewhat dizzy.  She has a history of rheumatic fever  and murmur by her report.  Cardiology now asked to evaluate.  The patient does not have symptoms concerning for COVID-19 infection (fever, chills, cough, or new shortness of breath).    Past Medical History:  Diagnosis Date  . Allergy   . Asthma   . Chest pain   . Dyslipidemia   . Hypertension   . IBS (irritable bowel syndrome)   . Murmur    Past Surgical History:  Procedure Laterality Date  . ABDOMINAL HYSTERECTOMY    . BIOPSY BREAST  1985  . COLONOSCOPY    . COLONOSCOPY N/A 09/03/2015   Procedure: COLONOSCOPY;  Surgeon: Danie Binder, MD;  [facility];  Service: Endoscopy;  Laterality: N/A;  1400  . KNEE ARTHROSCOPY     right 1982  . 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 acetaminophen (TYLENOL) 325 MG tablet Take 650 mg by mouth every 6 (six) hours as needed for moderate pain.  Marland Kitchen aspirin EC 81 MG tablet Take 1 tablet (81 mg total) by mouth 2 (two) times daily. For DVT prophylaxis for 30 days after surgery.  Marland Kitchen b complex vitamins tablet Take 1 tablet by mouth daily.  Marland Kitchen docusate sodium (COLACE) 100 MG capsule Take 1 capsule (100 mg total) by mouth 2 (two) times daily. To prevent constipation while taking pain medication.  Marland Kitchen ELDERBERRY PO Take 1 tablet by mouth daily.  . methocarbamol (ROBAXIN)  500 MG tablet Take 1 tablet (500 mg total) by mouth every 8 (eight) hours as needed for muscle spasms.  . milk thistle 175 MG tablet Take 175 mg by mouth daily.  . Multiple Vitamin (MULTIVITAMIN) capsule Take 1 capsule by mouth daily.  . Omega-3 Fatty Acids (FISH OIL) 1000 MG CAPS Take by mouth daily.  . ondansetron (ZOFRAN) 4 MG tablet Take 1 tablet (4 mg total) by mouth every 8 (eight) hours as needed for nausea or vomiting.  Marland Kitchen OVER THE COUNTER MEDICATION Pure lvr formula BId  . OVER THE  COUNTER MEDICATION Pure Nic Bid with meals  . OVER THE COUNTER MEDICATION D3 K2 once per day  . OVER THE COUNTER MEDICATION GI Detox one - twice daily  . OVER THE COUNTER MEDICATION Pro aller Bid  . OVER THE COUNTER MEDICATION GLA ( Gamma Linolenic Acid) Once daily  . OVER THE COUNTER MEDICATION Collagen Powder once daily  . OVER THE COUNTER MEDICATION Take 1 tablet by mouth daily. Nitric Acid  . OVER THE COUNTER MEDICATION Take 1 tablet by mouth daily. Cilantro Tablet  . OVER THE COUNTER MEDICATION Take 1 tablet by mouth daily. Garlique Supplement  . OVER THE COUNTER MEDICATION Take 1 tablet by mouth daily. Romaline  . Probiotic Product (PROBIOTIC DAILY PO) Take 3 capsules by mouth daily.      Allergies:   Beta adrenergic blockers; Levsin [hyoscyamine sulfate]; Neurontin [gabapentin]; Pravastatin; Sulfa antibiotics; Vicodin [hydrocodone-acetaminophen]; Zocor [simvastatin]; Ibuprofen; and Lidocaine   Social History   Tobacco Use  . Smoking status: Never Smoker  . Smokeless tobacco: Never Used  Substance Use Topics  . Alcohol use: No  . Drug use: No     Family Hx: The patient's family history includes Arthritis in her mother; Heart disease in her father; Hyperlipidemia in her mother; Hypertension in her father and mother. There is no history of Colon polyps or Colon cancer.  ROS:   Please see the history of present illness.    No fevers, chills or productive cough.  Patient does describe dizziness. All other systems reviewed and are negative.  Recent Labs: 05/28/2018: ALT 24; BUN 10; Creatinine, Ser 0.79; Potassium 3.8; Sodium 140 05/29/2018: Hemoglobin 8.7; Platelets 207   Recent Lipid Panel Lab Results  Component Value Date/Time   CHOL 243 (H) 06/02/2015 08:35 AM   TRIG 114 06/02/2015 08:35 AM   HDL 64 06/02/2015 08:35 AM   CHOLHDL 3.8 06/02/2015 08:35 AM   CHOLHDL 5.3 05/06/2014 09:21 AM   LDLCALC 156 (H) 06/02/2015 08:35 AM    Wt Readings from Last 3 Encounters:   08/10/18 151 lb 12.8 oz (68.9 kg)  05/28/18 155 lb (70.3 kg)  01/01/18 160 lb (72.6 kg)     Objective:    Vital Signs:  BP (!) 146/73   Pulse (!) 54   Ht 5' 6"  (1.676 m)   Wt 151 lb 12.8 oz (68.9 kg)   BMI 24.50 kg/m    VITAL SIGNS:  reviewed  No acute distress Normal affect Answers questions appropriately Remainder physical examination not performed (telehealth visit; coronavirus pandemic)  ASSESSMENT & PLAN:    1. Hypertension-patient's blood pressure is elevated and she states systolic is typically in the 1 40-1 50 range at home.  She did not tolerate beta-blockers or hydrochlorothiazide in the past by her report.  I have recommended losartan 50 mg daily.  She would prefer to review this with her primary care physician before implementing.  If she is agreeable we  will begin and then check potassium and renal function 1 week later.  Follow blood pressure and adjust regimen as needed.  Note she does not smoke nor consume alcohol.  We discussed low-sodium diet.  She exercises routinely. 2. Murmur-patient states she has a history of rheumatic fever and murmur.  We will arrange an echocardiogram to assess LV function and mitral valve. 3. Dizziness-etiology unclear.  History does not sound consistent with cardiac etiology.  We can consider a monitor in the future pending follow-up symptoms.  COVID-19 Education: The importance of social distancing was discussed today.  Time:   Today, I have spent 30 minutes with the patient with telehealth technology discussing the above problems.     Medication Adjustments/Labs and Tests Ordered: Current medicines are reviewed at length with the patient today.  Concerns regarding medicines are outlined above.   Tests Ordered: No orders of the defined types were placed in this encounter.   Medication Changes: No orders of the defined types were placed in this encounter.   Disposition:  Follow up in 3 month(s)  Signed, Kirk Ruths, MD   08/10/2018 9:39 AM    [facility]

## 2018-08-09 NOTE — Telephone Encounter (Signed)
Smartphone/ my chart/ consent/ pre reg completed °

## 2018-08-10 ENCOUNTER — Telehealth (INDEPENDENT_AMBULATORY_CARE_PROVIDER_SITE_OTHER): Payer: Medicare HMO | Admitting: Cardiology

## 2018-08-10 ENCOUNTER — Encounter: Payer: Self-pay | Admitting: Cardiology

## 2018-08-10 VITALS — BP 146/73 | HR 54 | Ht 66.0 in | Wt 151.8 lb

## 2018-08-10 DIAGNOSIS — R42 Dizziness and giddiness: Secondary | ICD-10-CM

## 2018-08-10 DIAGNOSIS — I10 Essential (primary) hypertension: Secondary | ICD-10-CM | POA: Diagnosis not present

## 2018-08-10 DIAGNOSIS — R011 Cardiac murmur, unspecified: Secondary | ICD-10-CM

## 2018-08-10 NOTE — Patient Instructions (Signed)
Medication Instructions:  CALL REGARDING STARTING LOSARTAN 50 MG ONCE DAILY=[phone] ASK FOR DEBRA MATHIS RN If you need a refill on your cardiac medications before your next appointment, please call your pharmacy.   Lab work: NEEDED IN ONE WEEK ONLY IF LOSARTAN STARTED If you have labs (blood work) drawn today and your tests are completely normal, you will receive your results only by: Marland Kitchen MyChart Message (if you have MyChart) OR . A paper copy in the mail If you have any lab test that is abnormal or we need to change your treatment, we will call you to review the results.  Testing/Procedures: Your physician has requested that you have an echocardiogram. Echocardiography is a painless test that uses sound waves to create images of your heart. It provides your doctor with information about the size and shape of your heart and how well your heart's chambers and valves are working. This procedure takes approximately one hour. There are no restrictions for this procedure.  [place]  Follow-Up: At [facility], you and your health needs are our priority.  As part of our continuing mission to provide you with exceptional heart care, we have created designated Provider Care Teams.  These Care Teams include your primary Cardiologist (physician) and Advanced Practice Providers (APPs -  Physician Assistants and Nurse Practitioners) who all work together to provide you with the care you need, when you need it. Your physician recommends that you schedule a follow-up appointment in: [place]

## 2018-08-24 DIAGNOSIS — H35033 Hypertensive retinopathy, bilateral: Secondary | ICD-10-CM | POA: Diagnosis not present

## 2018-08-24 DIAGNOSIS — H2513 Age-related nuclear cataract, bilateral: Secondary | ICD-10-CM | POA: Diagnosis not present

## 2018-08-24 DIAGNOSIS — H25013 Cortical age-related cataract, bilateral: Secondary | ICD-10-CM | POA: Diagnosis not present

## 2018-08-24 DIAGNOSIS — H04123 Dry eye syndrome of bilateral lacrimal glands: Secondary | ICD-10-CM | POA: Diagnosis not present

## 2018-09-03 ENCOUNTER — Telehealth (HOSPITAL_COMMUNITY): Payer: Self-pay | Admitting: *Deleted

## 2018-09-03 NOTE — Telephone Encounter (Signed)
COVID-19 Pre-Screening Questions:  . Do you currently have a fever? NO (yes = cancel and refer to pcp for e-visit) . Have you recently travelled on a cruise, internationally, or to [place], [place], [place], [place], [place], or [place], [place] [employer]) ? NO (yes = cancel, stay home, monitor symptoms, and contact pcp or initiate e-visit if symptoms develop) . Have you been in contact with someone that is currently pending confirmation of Covid19 testing or has been confirmed to have the [place] virus?  NO (yes = cancel, stay home, away from tested individual, monitor symptoms, and contact pcp or initiate e-visit if symptoms develop) . Are you currently experiencing fatigue or cough? NO (yes = pt should be prepared to have a mask placed at the time of their visit).   . Reiterated no additional visitors. Eartha Inch no earlier than 15 minutes before appointment time. . Please bring own mask.  Tracie Brooks

## 2018-09-06 ENCOUNTER — Other Ambulatory Visit: Payer: Self-pay

## 2018-09-06 ENCOUNTER — Ambulatory Visit (HOSPITAL_COMMUNITY): Payer: Medicare HMO | Attending: Cardiology

## 2018-09-06 DIAGNOSIS — R011 Cardiac murmur, unspecified: Secondary | ICD-10-CM

## 2018-10-07 ENCOUNTER — Encounter: Payer: Self-pay | Admitting: Neurology

## 2018-11-10 ENCOUNTER — Other Ambulatory Visit (INDEPENDENT_AMBULATORY_CARE_PROVIDER_SITE_OTHER): Payer: Medicare HMO

## 2018-11-10 ENCOUNTER — Encounter: Payer: Self-pay | Admitting: Neurology

## 2018-11-10 ENCOUNTER — Ambulatory Visit: Payer: Medicare HMO | Admitting: Neurology

## 2018-11-10 ENCOUNTER — Other Ambulatory Visit: Payer: Self-pay

## 2018-11-10 VITALS — BP 173/70 | HR 78 | Temp 98.6°F | Ht 66.0 in | Wt 171.0 lb

## 2018-11-10 DIAGNOSIS — H9191 Unspecified hearing loss, right ear: Secondary | ICD-10-CM

## 2018-11-10 DIAGNOSIS — R42 Dizziness and giddiness: Secondary | ICD-10-CM

## 2018-11-10 DIAGNOSIS — R6889 Other general symptoms and signs: Secondary | ICD-10-CM

## 2018-11-10 DIAGNOSIS — H93A1 Pulsatile tinnitus, right ear: Secondary | ICD-10-CM

## 2018-11-10 LAB — TSH: TSH: 3.56 u[IU]/mL (ref 0.35–4.50)

## 2018-11-10 NOTE — Patient Instructions (Addendum)
1.  We will check MRI of brain with and without contrast and MRA of head and neck 2.  We will check TSH 3.  Further recommendations pending results.  4. Follow up with your PCP about your elevated blood pressure reading today.  We have sent a referral to [facility] for your MRI/MRA's and they will call you directly to schedule your appointment. They are located at [place]. If you need to contact them directly, or you have not received a call to schedule an appointment within 5-7 days, please call [phone].  Your provider has requested that you have labwork completed today. Please go to [facility] ([id number]) on the second floor of this building before leaving the office today. You do not need to check in. If you are not called within 15 minutes please check with the front desk.

## 2018-11-10 NOTE — Progress Notes (Signed)
NEUROLOGY CONSULTATION NOTE  Tracie Brooks MRN: [id number] DOB: December 05, 1945  Referring provider: Modena Nunnery, MD Primary care provider: Mikey Kirschner, MD  Reason for consult:  dizziness  HISTORY OF PRESENT ILLNESS: Tracie Brooks is a 73 year old female who presents for dizziness.  She started having severe dizziness a couple of months ago.  Initially, she would lay down and experience severe spinning sensation, lasting 15 minutes.  No associated nausea, vomiting, double vision, headache.  Laying flat triggers it so she has been sleeping with pillow propped up.  Now dizziness is less severe, and more a lightheadedness rather than spinning.  It is positional and occurs often when bending over or any rapid movements, lasting just seconds.  No associated symptoms.    Frequency varies.  Sometimes when she gets up or moves, she feels off balance and needs to hold onto something to prevent falling. No associated dizziness.  Now, if she goes for an extended walk, she will take a cane with her.  She notes a whooshing sound in her right ear.  She underwent cardiac workup which was negative.  2 years ago, she had a severe ear infection in her right ear and never completely felt right.  She reports aural fullness and reduced hearing in the right ear.  She also reports increased seasonal allergies   PAST MEDICAL HISTORY: Past Medical History:  Diagnosis Date  . Allergy   . Asthma   . Chest pain   . Dyslipidemia   . Hypertension   . IBS (irritable bowel syndrome)   . Murmur     PAST SURGICAL HISTORY: Past Surgical History:  Procedure Laterality Date  . ABDOMINAL HYSTERECTOMY    . BIOPSY BREAST  1985  . COLONOSCOPY    . COLONOSCOPY N/A 09/03/2015   Procedure: COLONOSCOPY;  Surgeon: Danie Binder, MD;  [facility];  Service: Endoscopy;  Laterality: N/A;  1400  . KNEE ARTHROSCOPY     right 1982  . 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 acetaminophen (TYLENOL) 325 MG tablet Take 650 mg by mouth every 6 (six) hours as needed for moderate pain.    Marland Kitchen aspirin EC 81 MG tablet Take 1 tablet (81 mg total) by mouth 2 (two) times daily. For DVT prophylaxis for 30 days after surgery. 60 tablet 0  . b complex vitamins tablet Take 1 tablet by mouth daily.    Marland Kitchen docusate sodium (COLACE) 100 MG capsule Take 1 capsule (100 mg total) by mouth 2 (two) times daily. To prevent constipation while taking pain medication. 40 capsule 0  . ELDERBERRY PO Take 1 tablet by mouth daily.    . methocarbamol (ROBAXIN) 500 MG tablet Take 1 tablet (500 mg total) by mouth every 8 (eight) hours as needed for muscle spasms. 20 tablet 0  . milk thistle 175 MG tablet Take 175 mg by mouth daily.    . Multiple Vitamin (MULTIVITAMIN) capsule Take 1 capsule by mouth daily.    . Omega-3 Fatty Acids (FISH OIL) 1000 MG CAPS Take by mouth daily.    . ondansetron (ZOFRAN) 4 MG tablet Take 1 tablet (4 mg total) by mouth every 8 (eight) hours as needed for nausea or vomiting. 20 tablet 0  .  OVER THE COUNTER MEDICATION Pure lvr formula BId    . OVER THE COUNTER MEDICATION Pure Nic Bid with meals    . OVER THE COUNTER MEDICATION D3 K2 once per day    . OVER THE COUNTER MEDICATION GI Detox one - twice daily    . OVER THE COUNTER MEDICATION Pro aller Bid    . OVER THE COUNTER MEDICATION GLA ( Gamma Linolenic Acid) Once daily    . OVER THE COUNTER MEDICATION Collagen Powder once daily    . OVER THE COUNTER MEDICATION Take 1 tablet by mouth daily. Nitric Acid    . OVER THE COUNTER MEDICATION Take 1 tablet by mouth daily. Cilantro Tablet    . OVER THE COUNTER MEDICATION Take 1 tablet by mouth daily. Garlique Supplement    . OVER THE COUNTER MEDICATION Take 1 tablet  by mouth daily. Romaline    . Probiotic Product (PROBIOTIC DAILY PO) Take 3 capsules by mouth daily.      No current facility-administered medications on file prior to visit.     ALLERGIES: Allergies  Allergen Reactions  . Beta Adrenergic Blockers Swelling    swelling  . Levsin [Hyoscyamine Sulfate] Other (See Comments)    Blurred vision  . Neurontin [Gabapentin] Other (See Comments)    FOGGY BRAIN/CONSTIPATION  . Pravastatin Other (See Comments)    Joint pain  . Sulfa Antibiotics Other (See Comments)    Mom had it and pt was told never to take it   . Vicodin [Hydrocodone-Acetaminophen] Other (See Comments)    unknown  . Zocor [Simvastatin] Other (See Comments)    Joint pain  . Ibuprofen Rash  . Lidocaine Rash    cream    FAMILY HISTORY: Family History  Problem Relation Age of Onset  . Hypertension Mother   . Hyperlipidemia Mother   . Arthritis Mother   . Hypertension Father   . Heart disease Father        Pacemaker  . Colon polyps Neg Hx   . Colon cancer Neg Hx    SOCIAL HISTORY: Social History   Socioeconomic History  . Marital status: Married    Spouse name: Not on file  . Number of children: 1  . Years of education: Not on file  . Highest education level: Not on file  Occupational History  . Occupation: Retired  Scientific laboratory technician  . Financial resource strain: Not on file  . Food insecurity    Worry: Not on file    Inability: Not on file  . Transportation needs    Medical: Not on file    Non-medical: Not on file  Tobacco Use  . Smoking status: Never Smoker  . Smokeless tobacco: Never Used  Substance and Sexual Activity  . Alcohol use: No  . Drug use: No  . Sexual activity: Not on file  Lifestyle  . Physical activity    Days per week: Not on file    Minutes per session: Not on file  . Stress: Not on file  Relationships  . Social Herbalist on phone: Not on file    Gets together: Not on file    Attends religious service: Not on file     Active member of club or organization: Not on file    Attends meetings of clubs or organizations: Not on file    Relationship status: Not on file  . Intimate partner violence    Fear of current or ex partner: Not on file  Emotionally abused: Not on file    Physically abused: Not on file    Forced sexual activity: Not on file  Other Topics Concern  . 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   No regular exercise    REVIEW OF SYSTEMS: Constitutional: No fevers, chills, or sweats, no generalized fatigue, change in appetite Eyes: No visual changes, double vision, eye pain Ear, nose and throat: No hearing loss, ear pain, nasal congestion, sore throat Cardiovascular: No chest pain, palpitations Respiratory:  No shortness of breath at rest or with exertion, wheezes GastrointestinaI: No nausea, vomiting, diarrhea, abdominal pain, fecal incontinence Genitourinary:  No dysuria, urinary retention or frequency Musculoskeletal:  No neck pain, back pain Integumentary: No rash, pruritus, skin lesions Neurological: as above Psychiatric: No depression, insomnia, anxiety Endocrine: No palpitations, fatigue, diaphoresis, mood swings, change in appetite, change in weight, increased thirst Hematologic/Lymphatic:  No purpura, petechiae. Allergic/Immunologic: no itchy/runny eyes, nasal congestion, recent allergic reactions, rashes  PHYSICAL EXAM: Blood pressure (!) 173/70, pulse 78, temperature 98.6 F (37 C), temperature source Oral, height 5' 6"  (1.676 m), weight 171 lb (77.6 kg), SpO2 99 %. General: No acute distress.  Patient appears well-groomed.   Head:  Normocephalic/atraumatic Eyes:  fundi examined but not visualized Neck: supple, no paraspinal tenderness, full range of motion Back: No paraspinal tenderness Heart: regular rate and rhythm Lungs: Clear to auscultation bilaterally. Vascular: No carotid bruits. Neurological Exam: Mental  status: alert and oriented to person, place, and time, recent and remote memory intact, fund of knowledge intact, attention and concentration intact, speech fluent and not dysarthric, language intact. Cranial nerves: CN I: not tested CN II: pupils equal, round and reactive to light, visual fields intact CN III, IV, VI:  full range of motion, no nystagmus, no ptosis CN V: facial sensation intact CN VII: upper and lower face symmetric CN VIII: hearing intact CN IX, X: gag intact, uvula midline CN XI: sternocleidomastoid and trapezius muscles intact CN XII: tongue midline Bulk & Tone: normal, no fasciculations. Motor:  5/5 throughout  Sensation: temperature; vibration sensation reduced in right big toe, otherwise intact. Deep Tendon Reflexes:  2+ throughout, toes downgoing.   Finger to nose testing:  Without dysmetria.   Heel to shin:  Without dysmetria.   Gait:  Right limp.  No ataxia.  Able to turn and tandem walk. Romberg with mild late sway.  IMPRESSION: 1.  Positional vertigo/dizziness and disequilibrium.   2.  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  Rule out intracranial and extracranial abnormality/vascular abnormality or thyroid disorder. 3.  Elevated blood pressure  I suspect the dizziness is inner ear.  Initial event with spinning, otherwise, a positional lightheadedness or disequilibrium.  She endorses residual deficits from prior ear infection as well as ongoing seasonal allergies that may be playing a role.  However, she endorses pulsatile tinnitus which needs to be evaluated.  PLAN: 1.  MRI of brain/internal auditory canals and MRA of head and neck.   2.  TSH 3.  Further recommendations pending results.   4.  She should follow up with her PCP regarding elevated blood pressure.  Thank you for allowing me to take part in the care of this patient.  40 minutes spent face to face with patient, over 50% spent discussing differential diagnoses and management.  Metta Clines, DO

## 2018-11-11 ENCOUNTER — Encounter: Payer: Self-pay | Admitting: *Deleted

## 2018-11-25 ENCOUNTER — Ambulatory Visit: Payer: Medicare HMO | Admitting: Cardiology

## 2018-12-10 DIAGNOSIS — E559 Vitamin D deficiency, unspecified: Secondary | ICD-10-CM | POA: Diagnosis not present

## 2018-12-10 DIAGNOSIS — R7982 Elevated C-reactive protein (CRP): Secondary | ICD-10-CM | POA: Diagnosis not present

## 2018-12-10 DIAGNOSIS — D649 Anemia, unspecified: Secondary | ICD-10-CM | POA: Diagnosis not present

## 2018-12-10 DIAGNOSIS — R5383 Other fatigue: Secondary | ICD-10-CM | POA: Diagnosis not present

## 2018-12-10 DIAGNOSIS — E611 Iron deficiency: Secondary | ICD-10-CM | POA: Diagnosis not present

## 2018-12-10 DIAGNOSIS — J3089 Other allergic rhinitis: Secondary | ICD-10-CM | POA: Diagnosis not present

## 2018-12-10 DIAGNOSIS — R790 Abnormal level of blood mineral: Secondary | ICD-10-CM | POA: Diagnosis not present

## 2018-12-10 DIAGNOSIS — E782 Mixed hyperlipidemia: Secondary | ICD-10-CM | POA: Diagnosis not present

## 2018-12-10 DIAGNOSIS — I10 Essential (primary) hypertension: Secondary | ICD-10-CM | POA: Diagnosis not present

## 2018-12-10 DIAGNOSIS — E271 Primary adrenocortical insufficiency: Secondary | ICD-10-CM | POA: Diagnosis not present

## 2018-12-13 ENCOUNTER — Ambulatory Visit: Payer: Medicare HMO | Admitting: Cardiology

## 2018-12-20 ENCOUNTER — Telehealth: Payer: Self-pay | Admitting: *Deleted

## 2018-12-20 ENCOUNTER — Inpatient Hospital Stay: Admission: RE | Admit: 2018-12-20 | Payer: Medicare HMO | Source: Ambulatory Visit

## 2018-12-20 ENCOUNTER — Ambulatory Visit
Admission: RE | Admit: 2018-12-20 | Discharge: 2018-12-20 | Disposition: A | Discharge status: Home / Self Care | Payer: Medicare HMO | Source: Ambulatory Visit | Attending: Neurology | Admitting: Neurology

## 2018-12-20 ENCOUNTER — Other Ambulatory Visit: Payer: Self-pay

## 2018-12-20 DIAGNOSIS — H93A1 Pulsatile tinnitus, right ear: Secondary | ICD-10-CM

## 2018-12-20 DIAGNOSIS — H9191 Unspecified hearing loss, right ear: Secondary | ICD-10-CM

## 2018-12-20 DIAGNOSIS — R42 Dizziness and giddiness: Secondary | ICD-10-CM

## 2018-12-20 DIAGNOSIS — R6889 Other general symptoms and signs: Secondary | ICD-10-CM

## 2018-12-20 MED ORDER — GADOBENATE DIMEGLUMINE 529 MG/ML IV SOLN
15.0000 mL | Freq: Once | INTRAVENOUS | Status: AC | PRN
  Administered 2018-12-20: 15 mL via INTRAVENOUS

## 2018-12-20 NOTE — Telephone Encounter (Signed)
Called to give patient MRI results from MD. Voicemail full. Will try again tomorrow.  Did order CTA of head and neck per MD order. Will inform patient of that when contact her.

## 2018-12-20 NOTE — Telephone Encounter (Addendum)
Beverly from [facility] called and the MRA of neck has to be ordered with AND without contrast. (The orders for MRA head and MRI brain are fine and do not need to be changed). Orders changed.  Dr Tomi Likens - FYI above.   Update- call received from [facility] and stated it does need to be reauthorized for [facility] for patient to have scans done this am.   Message/call placed to [facility] who does PA for our office regarding above. She will try and get reauthorized now and call Mardene Celeste at above number

## 2018-12-20 NOTE — Telephone Encounter (Signed)
-----   Message from Pieter Partridge, DO sent at 12/20/2018  3:52 PM EDT ----- MRI reveals no stroke or tumors.  Some of the arteries in the head look narrowed, possibly due to atherosclerotic/age-related changes, but it may also be due to artifact secondary to limitations of the MRI.  For better visualization of the arteries, I would like to get CTA of head and neck.

## 2018-12-21 DIAGNOSIS — D649 Anemia, unspecified: Secondary | ICD-10-CM | POA: Diagnosis not present

## 2018-12-22 ENCOUNTER — Telehealth: Payer: Self-pay | Admitting: *Deleted

## 2018-12-22 NOTE — Telephone Encounter (Signed)
-----   Message from Pieter Partridge, DO sent at 12/20/2018  3:52 PM EDT ----- MRI reveals no stroke or tumors.  Some of the arteries in the head look narrowed, possibly due to atherosclerotic/age-related changes, but it may also be due to artifact secondary to limitations of the MRI.  For better visualization of the arteries, I would like to get CTA of head and neck.

## 2018-12-22 NOTE — Telephone Encounter (Signed)
This is the third time have attempted to call patient with her results and to tell her MD wants CTA (ordered the other day). Unable to leave message on machine.

## 2018-12-23 ENCOUNTER — Other Ambulatory Visit: Payer: Self-pay | Admitting: *Deleted

## 2018-12-23 NOTE — Progress Notes (Signed)
HPI: FU hypertension, hyperlipidemia, murmur and dizziness.  Patient states she has had hypertension in the past but controlled with risk factor modification.  She states beta-blockers caused swelling in the past and she discontinued.  She states hydrochlorothiazide was not tolerated because of bone issues.  Echocardiogram June 2020 showed normal LV function, severe asymmetric left ventricular hypertrophy, grade 2 diastolic dysfunction, moderate left atrial enlargement.  MRA September 2020 showed possible advanced intracranial atherosclerosis and CTA recommended.  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  No significant carotid stenosis.  Seen by neurology and felt likely to have positional vertigo/dizziness and disequilibrium possibly secondary to inner ear. Since last seen there is no dyspnea, chest pain, palpitations or syncope.  Chronic mild pedal edema.  Dizziness has improved with iron supplementation.  Current Outpatient Medications  Medication Sig Dispense Refill  . Multiple Vitamin (MULTIVITAMIN) capsule Take 1 capsule by mouth daily.    . Omega-3 Fatty Acids (FISH OIL) 1000 MG CAPS Take by mouth daily.    Marland Kitchen OVER THE COUNTER MEDICATION Pure lvr formula BId    . OVER THE COUNTER MEDICATION Pure Nic Bid with meals    . OVER THE COUNTER MEDICATION D3 K2 once per day    . OVER THE COUNTER MEDICATION GI Detox one - twice daily    . OVER THE COUNTER MEDICATION Pro aller Bid    . OVER THE COUNTER MEDICATION GLA ( Gamma Linolenic Acid) Once daily    . OVER THE COUNTER MEDICATION Collagen Powder once daily    . OVER THE COUNTER MEDICATION Take 1 tablet by mouth daily. Nitric Acid    . Probiotic Product (PROBIOTIC DAILY PO) Take 3 capsules by mouth daily.     . TURMERIC PO Take 2 tablets by mouth daily.     No current facility-administered medications for this visit.      Past Medical History:  Diagnosis Date  . Allergy   . Asthma   . Chest pain   . Dyslipidemia   . Hypertension    . IBS (irritable bowel syndrome)   . Murmur     Past Surgical History:  Procedure Laterality Date  . ABDOMINAL HYSTERECTOMY    . BIOPSY BREAST  1985  . COLONOSCOPY    . COLONOSCOPY N/A 09/03/2015   Procedure: COLONOSCOPY;  Surgeon: Danie Binder, MD;  Location: [facility];  Service: Endoscopy;  Laterality: N/A;  1400  . KNEE ARTHROSCOPY     right 1982  . 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 Marital status: Married    Spouse name: Christean Grief  . Number of children: 1  . Years of education: Not on file  . Highest education level: Master's degree (e.g., MA, MS, MEng, MEd, MSW, MBA)  Occupational History  . Occupation: Controller/HR  Social Needs  . Financial resource strain: Not on file  . Food insecurity    Worry: Not on file    Inability: Not on file  . Transportation needs    Medical: Not on file    Non-medical: Not on file  Tobacco Use  . Smoking status: Never Smoker  . Smokeless tobacco: Never Used  Substance and Sexual Activity  . Alcohol use: No  . Drug use: No  . Sexual activity: Not on file  Lifestyle  .  Physical activity    Days per week: Not on file    Minutes per session: Not on file  . Stress: Not on file  Relationships  . Social Herbalist on phone: Not on file    Gets together: Not on file    Attends religious service: Not on file    Active member of club or organization: Not on file    Attends meetings of clubs or organizations: Not on file    Relationship status: Not on file  . Intimate partner violence    Fear of current or ex partner: Not on file    Emotionally abused: Not on file    Physically abused: Not on file    Forced sexual activity: Not on file  Other Topics Concern  . 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   No regular exercise      Patient is right-handed. She lives with her husband in a 2 level home. She walks most days.    Family History  Problem Relation Age of Onset  . Hypertension Mother 74  . Hyperlipidemia Mother   . Arthritis Mother   . Hypertension Father 72  . Heart disease Father        Pacemaker  . Dementia Father   . Heart disease Sister   . Congestive Heart Failure Brother   . Breast cancer Sister   . Colon polyps Neg Hx   . Colon cancer Neg Hx     ROS: no fevers or chills, productive cough, hemoptysis, dysphasia, odynophagia, melena, hematochezia, dysuria, hematuria, rash, seizure activity, orthopnea, PND, claudication. Remaining systems are negative.  Physical Exam: Well-developed well-nourished in no acute distress.  Skin is warm and dry.  HEENT is normal.  Neck is supple.  Chest is clear to auscultation with normal expansion.  Cardiovascular exam is regular rate and rhythm.  2/6 systolic ejection murmur Abdominal exam nontender or distended. No masses palpated. Extremities show trace edema. neuro grossly intact  ECG-sinus rhythm with occasional PAC, no ST changes.  Personally reviewed  A/P  1 hypertension-blood pressure is mildly elevated.  However she would prefer natural remedies and is following with a holistic physician for this.  She will contact us if blood pressure continues to run high and she would be interested in considering an antihypertensive.  2 murmur-I have personally reviewed the patient's echocardiogram.  LV function is normal.  I do not believe she has severe asymmetric hypertrophy.  Probable flow murmur.  3 dizziness-symptoms do not sound consistent with cardiac etiology.  Seen by neurology and felt possibly to have inner ear/vertigo problems.  Some improvement with iron supplementation.  No plans for further evaluation.  Kirk Ruths, MD

## 2018-12-23 NOTE — Telephone Encounter (Signed)
Patient left msg with after hours returning a call to [place]. Phone # [phone]. Thanks!

## 2018-12-27 ENCOUNTER — Encounter: Payer: Self-pay | Admitting: Cardiology

## 2018-12-27 ENCOUNTER — Ambulatory Visit: Payer: Medicare HMO | Admitting: Cardiology

## 2018-12-27 ENCOUNTER — Other Ambulatory Visit: Payer: Self-pay

## 2018-12-27 VITALS — BP 148/60 | HR 75 | Temp 97.3°F | Ht 66.0 in | Wt 169.0 lb

## 2018-12-27 DIAGNOSIS — R011 Cardiac murmur, unspecified: Secondary | ICD-10-CM | POA: Diagnosis not present

## 2018-12-27 DIAGNOSIS — R42 Dizziness and giddiness: Secondary | ICD-10-CM | POA: Diagnosis not present

## 2018-12-27 DIAGNOSIS — I10 Essential (primary) hypertension: Secondary | ICD-10-CM | POA: Diagnosis not present

## 2018-12-27 NOTE — Patient Instructions (Signed)
Medication Instructions:  NO CHANGE If you need a refill on your cardiac medications before your next appointment, please call your pharmacy.   Lab work: If you have labs (blood work) drawn today and your tests are completely normal, you will receive your results only by: Marland Kitchen MyChart Message (if you have MyChart) OR . A paper copy in the mail If you have any lab test that is abnormal or we need to change your treatment, we will call you to review the results.  Follow-Up: At [facility], you and your health needs are our priority.  As part of our continuing mission to provide you with exceptional heart care, we have created designated Provider Care Teams.  These Care Teams include your primary Cardiologist (physician) and Advanced Practice Providers (APPs -  Physician Assistants and Nurse Practitioners) who all work together to provide you with the care you need, when you need it. Your physician recommends that you schedule a follow-up appointment in: AS NEEDED

## 2018-12-27 NOTE — Telephone Encounter (Signed)
Called and spoke to patient and informed her of MDs result note   "MRI reveals no stroke or tumors. Some of the arteries in the head look narrowed, possibly due to atherosclerotic/age-related changes, but it may also be due to artifact secondary to limitations of the MRI. For better visualization of the arteries, I would like to get CTA of head and neck."  She verbalizes understanding and no further questions.

## 2019-01-06 ENCOUNTER — Ambulatory Visit
Admission: RE | Admit: 2019-01-06 | Discharge: 2019-01-06 | Disposition: A | Discharge status: Home / Self Care | Payer: Medicare HMO | Source: Ambulatory Visit | Attending: Neurology | Admitting: Neurology

## 2019-01-06 ENCOUNTER — Other Ambulatory Visit: Payer: Self-pay

## 2019-01-06 DIAGNOSIS — I6621 Occlusion and stenosis of right posterior cerebral artery: Secondary | ICD-10-CM | POA: Diagnosis not present

## 2019-01-06 DIAGNOSIS — R42 Dizziness and giddiness: Secondary | ICD-10-CM

## 2019-01-06 DIAGNOSIS — H93A1 Pulsatile tinnitus, right ear: Secondary | ICD-10-CM

## 2019-01-06 DIAGNOSIS — R6889 Other general symptoms and signs: Secondary | ICD-10-CM

## 2019-01-06 DIAGNOSIS — H9191 Unspecified hearing loss, right ear: Secondary | ICD-10-CM

## 2019-01-06 DIAGNOSIS — I6602 Occlusion and stenosis of left middle cerebral artery: Secondary | ICD-10-CM | POA: Diagnosis not present

## 2019-01-06 MED ORDER — IOPAMIDOL (ISOVUE-370) INJECTION 76%
75.0000 mL | Freq: Once | INTRAVENOUS | Status: AC | PRN
  Administered 2019-01-06: 75 mL via INTRAVENOUS

## 2019-01-10 ENCOUNTER — Telehealth: Payer: Self-pay | Admitting: *Deleted

## 2019-01-10 NOTE — Telephone Encounter (Signed)
Called and spoke to patient and shared MD recommendation below about the 27m aspirin she verbalized understanding. She states she is going to check with her Integrative Medicine doctor who has had her on the nitric oxide for a while but she is aware of Dr. JGeorgie Chardrecommendation.  Virtual visit with Dr. JTomi Likens10/21.

## 2019-01-10 NOTE — Telephone Encounter (Addendum)
Spoke to patient and relayed below information. Verbalized understanding.  Regarding the 81  Mg  Aspirin Dr. Tomi Likens recommends patient stated if it is to make her blood thin she is already taking something prescribed by Dr. Dalbert Batman at [facility].  It is called "nitric balance" and it is nitric oxide. She takes 0.42m of it twice a day with her meals (it is in liquid form). She didn't know if she should take the aspirin along with this current supplement.  Told patient will share this information above with Dr. JTomi Likensand call her back about the aspirin.  Front desk to call patient to make f/u appt - patient aware they will be calling.    ----- Message from APieter Partridge DO sent at 01/07/2019 11:01 AM EDT -----    Reviewed CTA of head and neck.  Some of the arteries in the head show some plaque build-up that cause narrowing of the blood vessels.  The arteries in the neck look okay.  I would recommend starting an aspirin 835mdaily.  I would like her to make a follow up appointment to discuss further.  Virtual visit is okay.

## 2019-01-10 NOTE — Telephone Encounter (Signed)
My recommendation is aspirin 60m daily.  I don't think there is any contraindication to taking it with nitric oxide but since she will be taking aspirin, then she may as well stop the nitric oxide.

## 2019-01-17 NOTE — Progress Notes (Signed)
Virtual Visit via Video Note The purpose of this virtual visit is to provide medical care while limiting exposure to the novel coronavirus.    Consent was obtained for video visit:  Yes Answered questions that patient had about telehealth interaction:  Yes I discussed the limitations, risks, security and privacy concerns of performing an evaluation and management service by telemedicine. I also discussed with the patient that there may be a patient responsible charge related to this service. The patient expressed understanding and agreed to proceed.  Pt location: Home Physician Location: Home Name of referring provider:  Mikey Kirschner, MD I connected with Tracie Brooks at patients initiation/request on 01/19/2019 at 10:50 AM EDT by video enabled telemedicine application and verified that I am speaking with the correct person using two identifiers. Pt MRN:  [id number] Pt DOB:  [birth year] Video Participants:  Tracie Brooks   History of Present Illness:  Tracie Brooks is a 73 year old female who follows up for vertigo/disequilibrium and pulsatile tinnitus.  UPDATE: TSH from 11/10/2018 was 3.56.  MRI of brain and internal auditory canals with and without contrast from 12/20/2018 showed mild to moderate chronic small vessel ischemic changes but no mass lesion or acute intracranial abnormality.  MRA of head and neck showed advanced intracranial atherosclerosis including severe bilateral PCA and possibly severe distal right ICA and bilateral MCA stenoses as well as hypoplastic right vertebral artery.  Follow up CTA of head and neck on 01/06/2019 did confirm severe intracranial stenoses involving the right PCA P2 segment and short-segment of the left MCA bifurcation (sparing the anterior division), as well as mild intracranial stenoses but no significant atherosclerosis or stenosis in the neck.  She was found to have iron-deficiency anemia.  Since starting iron supplementation, dizziness has greatly  improved.  Pulsatile tinnitus resolved as well.  She is able to exercise again.  She has more energy and sleeping better.  HISTORY: She started having severe dizziness around June 2020.  Initially, she would lay down and experience severe spinning sensation, lasting 15 minutes.  No associated nausea, vomiting, double vision, headache.  Laying flat triggers it so she has been sleeping with pillow propped up.  Now dizziness is less severe, and more a lightheadedness rather than spinning.  It is positional and occurs often when bending over or any rapid movements, lasting just seconds.  No associated symptoms.    Frequency varies.  Sometimes when she gets up or moves, she feels off balance and needs to hold onto something to prevent falling. No associated dizziness.  Now, if she goes for an extended walk, she will take a cane with her.  She notes a whooshing sound in her right ear.  She underwent cardiac workup which was negative.  2 years ago, she had a severe ear infection in her right ear and never completely felt right.  She reports aural fullness and reduced hearing in the right ear.  She also reports increased seasonal allergies   Past Medical History: Past Medical History:  Diagnosis Date  . Allergy   . Asthma   . Chest pain   . Dyslipidemia   . Hypertension   . IBS (irritable bowel syndrome)   . Murmur     Medications: Outpatient Encounter Medications as of 01/19/2019  Medication Sig  . Multiple Vitamin (MULTIVITAMIN) capsule Take 1 capsule by mouth daily.  . Omega-3 Fatty Acids (FISH OIL) 1000 MG CAPS Take by mouth daily.  Marland Kitchen OVER THE COUNTER MEDICATION  Pure lvr formula BId  . OVER THE COUNTER MEDICATION Pure Nic Bid with meals  . OVER THE COUNTER MEDICATION D3 K2 once per day  . OVER THE COUNTER MEDICATION GI Detox one - twice daily  . OVER THE COUNTER MEDICATION Pro aller Bid  . OVER THE COUNTER MEDICATION GLA ( Gamma Linolenic Acid) Once daily  . OVER THE COUNTER MEDICATION  Collagen Powder once daily  . OVER THE COUNTER MEDICATION Take 1 tablet by mouth daily. Nitric Acid  . Probiotic Product (PROBIOTIC DAILY PO) Take 3 capsules by mouth daily.   . TURMERIC PO Take 2 tablets by mouth daily.   No facility-administered encounter medications on file as of 01/19/2019.     Allergies: Allergies  Allergen Reactions  . Beta Adrenergic Blockers Swelling    swelling  . Levsin [Hyoscyamine Sulfate] Other (See Comments)    Blurred vision  . Neurontin [Gabapentin] Other (See Comments)    FOGGY BRAIN/CONSTIPATION  . Pravastatin Other (See Comments)    Joint pain  . Sulfa Antibiotics Other (See Comments)    Mom had it and pt was told never to take it   . Vicodin [Hydrocodone-Acetaminophen] Other (See Comments)    unknown  . Zocor [Simvastatin] Other (See Comments)    Joint pain  . Ibuprofen Rash  . Lidocaine Rash    cream    Family History: Family History  Problem Relation Age of Onset  . Hypertension Mother 17  . Hyperlipidemia Mother   . Arthritis Mother   . Hypertension Father 29  . Heart disease Father        Pacemaker  . Dementia Father   . Heart disease Sister   . Congestive Heart Failure Brother   . Breast cancer Sister   . Colon polyps Neg Hx   . Colon cancer Neg Hx     Social History: Social History   Socioeconomic History  . Marital status: Married    Spouse name: Christean Grief  . Number of children: 1  . Years of education: Not on file  . Highest education level: Master's degree (e.g., MA, MS, MEng, MEd, MSW, MBA)  Occupational History  . Occupation: Controller/HR  Social Needs  . Financial resource strain: Not on file  . Food insecurity    Worry: Not on file    Inability: Not on file  . Transportation needs    Medical: Not on file    Non-medical: Not on file  Tobacco Use  . Smoking status: Never Smoker  . Smokeless tobacco: Never Used  Substance and Sexual Activity  . Alcohol use: No  . Drug use: No  . Sexual activity: Not on  file  Lifestyle  . Physical activity    Days per week: Not on file    Minutes per session: Not on file  . Stress: Not on file  Relationships  . Social Herbalist on phone: Not on file    Gets together: Not on file    Attends religious service: Not on file    Active member of club or organization: Not on file    Attends meetings of clubs or organizations: Not on file    Relationship status: Not on file  . Intimate partner violence    Fear of current or ex partner: Not on file    Emotionally abused: Not on file    Physically abused: Not on file    Forced sexual activity: Not on file  Other Topics Concern  .  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   No regular exercise      Patient is right-handed. She lives with her husband in a 2 level home. She walks most days.    Observations/Objective:   Height 5' 6"  (1.676 m), weight 160 lb (72.6 kg). No acute distress.  Alert and oriented.  Speech fluent and not dysarthric.  Language intact.  Eyes orthophoric on primary gaze.  Face symmetric.  Assessment and Plan:   1.  Dizziness/pulsatile tinnitus, resolved.  Likely related to iron-deficiency anemia. 2.  Intracranial stenosis.    Due to evidence of intracranial stenosis, some severe, I recommended starting ASA 72m daily to reduce risk of potential CVA.  She is concerned about potential side effects on the liver and increased risk for bleeding.  Instead, she opts for natural supplements such as turmeric and ginger.  I told her I could not support that choice but welcomed that she discuss with her PCP.  Otherwise, she may follow up as needed.  Follow Up Instructions:    -I discussed the assessment and treatment plan with the patient. The patient was provided an opportunity to ask questions and all were answered. The patient agreed with the plan and demonstrated an understanding of the instructions.   The patient was advised to  call back or seek an in-person evaluation if the symptoms worsen or if the condition fails to improve as anticipated.    Total Time spent in visit with the patient was:  15 minutes    ADudley Major DO

## 2019-01-19 ENCOUNTER — Encounter: Payer: Self-pay | Admitting: Neurology

## 2019-01-19 ENCOUNTER — Other Ambulatory Visit: Payer: Self-pay

## 2019-01-19 ENCOUNTER — Telehealth (INDEPENDENT_AMBULATORY_CARE_PROVIDER_SITE_OTHER): Payer: Medicare HMO | Admitting: Neurology

## 2019-01-19 VITALS — Ht 66.0 in | Wt 160.0 lb

## 2019-01-19 DIAGNOSIS — R42 Dizziness and giddiness: Secondary | ICD-10-CM | POA: Diagnosis not present

## 2019-01-19 DIAGNOSIS — I6529 Occlusion and stenosis of unspecified carotid artery: Secondary | ICD-10-CM

## 2019-01-19 DIAGNOSIS — H93A1 Pulsatile tinnitus, right ear: Secondary | ICD-10-CM

## 2019-01-25 DIAGNOSIS — D8940 Mast cell activation, unspecified: Secondary | ICD-10-CM | POA: Diagnosis not present

## 2019-01-25 DIAGNOSIS — D649 Anemia, unspecified: Secondary | ICD-10-CM | POA: Diagnosis not present

## 2019-02-08 DIAGNOSIS — D649 Anemia, unspecified: Secondary | ICD-10-CM | POA: Diagnosis not present

## 2019-02-08 DIAGNOSIS — Z7689 Persons encountering health services in other specified circumstances: Secondary | ICD-10-CM | POA: Diagnosis not present

## 2019-02-08 DIAGNOSIS — I10 Essential (primary) hypertension: Secondary | ICD-10-CM | POA: Diagnosis not present

## 2019-02-08 DIAGNOSIS — R42 Dizziness and giddiness: Secondary | ICD-10-CM | POA: Diagnosis not present

## 2019-02-28 ENCOUNTER — Other Ambulatory Visit: Payer: Self-pay | Admitting: Rehabilitative and Restorative Service Providers"

## 2019-05-05 ENCOUNTER — Encounter: Payer: Self-pay | Admitting: Family Medicine

## 2019-09-28 DIAGNOSIS — M25561 Pain in right knee: Secondary | ICD-10-CM | POA: Diagnosis not present

## 2019-09-28 DIAGNOSIS — M25562 Pain in left knee: Secondary | ICD-10-CM | POA: Diagnosis not present

## 2019-10-04 DIAGNOSIS — E782 Mixed hyperlipidemia: Secondary | ICD-10-CM | POA: Diagnosis not present

## 2019-10-04 DIAGNOSIS — E611 Iron deficiency: Secondary | ICD-10-CM | POA: Diagnosis not present

## 2019-10-04 DIAGNOSIS — B370 Candidal stomatitis: Secondary | ICD-10-CM | POA: Diagnosis not present

## 2019-10-04 DIAGNOSIS — D649 Anemia, unspecified: Secondary | ICD-10-CM | POA: Diagnosis not present

## 2019-10-04 DIAGNOSIS — R7982 Elevated C-reactive protein (CRP): Secondary | ICD-10-CM | POA: Diagnosis not present

## 2019-10-04 DIAGNOSIS — D849 Immunodeficiency, unspecified: Secondary | ICD-10-CM | POA: Diagnosis not present

## 2019-10-04 DIAGNOSIS — D519 Vitamin B12 deficiency anemia, unspecified: Secondary | ICD-10-CM | POA: Diagnosis not present

## 2019-10-04 DIAGNOSIS — R5383 Other fatigue: Secondary | ICD-10-CM | POA: Diagnosis not present

## 2019-10-04 DIAGNOSIS — I10 Essential (primary) hypertension: Secondary | ICD-10-CM | POA: Diagnosis not present

## 2019-10-04 DIAGNOSIS — E785 Hyperlipidemia, unspecified: Secondary | ICD-10-CM | POA: Diagnosis not present

## 2019-10-04 DIAGNOSIS — E559 Vitamin D deficiency, unspecified: Secondary | ICD-10-CM | POA: Diagnosis not present

## 2019-11-02 DIAGNOSIS — M25561 Pain in right knee: Secondary | ICD-10-CM | POA: Diagnosis not present

## 2019-11-07 DIAGNOSIS — M25562 Pain in left knee: Secondary | ICD-10-CM | POA: Diagnosis not present

## 2019-11-09 DIAGNOSIS — M25562 Pain in left knee: Secondary | ICD-10-CM | POA: Diagnosis not present

## 2019-11-30 DEATH — deceased

## 2019-12-29 DIAGNOSIS — S83272A Complex tear of lateral meniscus, current injury, left knee, initial encounter: Secondary | ICD-10-CM | POA: Diagnosis not present

## 2019-12-29 DIAGNOSIS — S83242A Other tear of medial meniscus, current injury, left knee, initial encounter: Secondary | ICD-10-CM | POA: Diagnosis not present

## 2019-12-29 DIAGNOSIS — M2242 Chondromalacia patellae, left knee: Secondary | ICD-10-CM | POA: Diagnosis not present

## 2019-12-29 DIAGNOSIS — G8918 Other acute postprocedural pain: Secondary | ICD-10-CM | POA: Diagnosis not present

## 2019-12-29 DIAGNOSIS — X58XXXA Exposure to other specified factors, initial encounter: Secondary | ICD-10-CM | POA: Diagnosis not present

## 2019-12-29 DIAGNOSIS — S83232A Complex tear of medial meniscus, current injury, left knee, initial encounter: Secondary | ICD-10-CM | POA: Diagnosis not present

## 2019-12-29 DIAGNOSIS — S83282A Other tear of lateral meniscus, current injury, left knee, initial encounter: Secondary | ICD-10-CM | POA: Diagnosis not present

## 2020-01-09 DIAGNOSIS — S83242D Other tear of medial meniscus, current injury, left knee, subsequent encounter: Secondary | ICD-10-CM | POA: Diagnosis not present

## 2020-01-09 DIAGNOSIS — S83282D Other tear of lateral meniscus, current injury, left knee, subsequent encounter: Secondary | ICD-10-CM | POA: Diagnosis not present

## 2020-05-15 DIAGNOSIS — E782 Mixed hyperlipidemia: Secondary | ICD-10-CM | POA: Diagnosis not present

## 2020-05-15 DIAGNOSIS — E063 Autoimmune thyroiditis: Secondary | ICD-10-CM | POA: Diagnosis not present

## 2020-05-15 DIAGNOSIS — R799 Abnormal finding of blood chemistry, unspecified: Secondary | ICD-10-CM | POA: Diagnosis not present

## 2020-05-15 DIAGNOSIS — K529 Noninfective gastroenteritis and colitis, unspecified: Secondary | ICD-10-CM | POA: Diagnosis not present

## 2020-05-15 DIAGNOSIS — E559 Vitamin D deficiency, unspecified: Secondary | ICD-10-CM | POA: Diagnosis not present

## 2020-05-15 DIAGNOSIS — I10 Essential (primary) hypertension: Secondary | ICD-10-CM | POA: Diagnosis not present

## 2020-05-15 DIAGNOSIS — E611 Iron deficiency: Secondary | ICD-10-CM | POA: Diagnosis not present

## 2020-07-04 ENCOUNTER — Other Ambulatory Visit: Payer: Self-pay

## 2020-07-04 ENCOUNTER — Encounter: Payer: Self-pay | Admitting: Allergy & Immunology

## 2020-07-04 ENCOUNTER — Ambulatory Visit: Payer: Medicare HMO | Admitting: Allergy & Immunology

## 2020-07-04 VITALS — BP 132/70 | HR 81 | Temp 98.0°F | Resp 16 | Ht 66.0 in | Wt 184.2 lb

## 2020-07-04 DIAGNOSIS — J310 Chronic rhinitis: Secondary | ICD-10-CM

## 2020-07-04 DIAGNOSIS — K9049 Malabsorption due to intolerance, not elsewhere classified: Secondary | ICD-10-CM | POA: Diagnosis not present

## 2020-07-04 DIAGNOSIS — R0602 Shortness of breath: Secondary | ICD-10-CM | POA: Diagnosis not present

## 2020-07-04 NOTE — Patient Instructions (Signed)
1. Food intolerance - Testing was negative to everything tested today. - Copy of testing results provided. - This certainly seems like more of an intolerance rather than an allergy.  2. Chronic rhinitis - Testing was negative to the entire panel. - We could do intradermal testing that is more sensitive, but I can understand that you did not want to go through with that. - We would need to do that if you were thinking about allergy shots in the future.   3. No follow-ups on file.    Please inform us of any Emergency Department visits, hospitalizations, or changes in symptoms. Call us before going to the ED for breathing or allergy symptoms since we might be able to fit you in for a sick visit. Feel free to contact us anytime with any questions, problems, or concerns.  It was a pleasure to meet you today!  Websites that have reliable patient information: 1. American Academy of Asthma, Allergy, and Immunology: www.aaaai.org 2. Food Allergy Research and Education (FARE): foodallergy.org 3. Mothers of Asthmatics: http://www.asthmacommunitynetwork.org 4. American College of Allergy, Asthma, and Immunology: www.acaai.org   COVID-19 Vaccine Information can be found at: ShippingScam.co.uk For questions related to vaccine distribution or appointments, please email [email or web address] or call [phone].   We realize that you might be concerned about having an allergic reaction to the COVID19 vaccines. To help with that concern, WE ARE OFFERING THE COVID19 VACCINES IN OUR OFFICE! Ask the front desk for dates!     "Like" [place] on Facebook and Instagram for our latest updates!      A healthy democracy works best when [employer] participate! Make sure you are registered to vote! If you have moved or changed any of your contact information, you will need to get this updated before voting!  In some cases, you MAY be able to register to  vote online: [email or web address]

## 2020-07-04 NOTE — Progress Notes (Signed)
NEW PATIENT  Date of Service/Encounter:  07/04/20  Consult requested by: Elvia Collum M, DO   Assessment:   Food intolerance   Chronic rhinitis   Shortness of breath and coughing - with normal spirometry today (? GERD)   Preference for holistic treatments  Plan/Recommendations:   1. Food intolerance - Testing was negative to everything tested today. - Copy of testing results provided. - This certainly seems like more of an intolerance rather than an allergy.  2. Chronic rhinitis - Testing was negative to the entire panel. - We could do intradermal testing that is more sensitive, but I can understand that you did not want to go through with that. - We would need to do that if you were thinking about allergy shots in the future.   3. Follow up as needed or earlier if needed.  Subjective:   Tracie Brooks is a 75 y.o. female presenting today for evaluation of  Chief Complaint  Patient presents with  . Allergy Testing    MATISHA TERMINE has a history of the following: Patient Active Problem List   Diagnosis Date Noted  . Closed right hip fracture, initial encounter ([place]) 05/27/2018  . Osteopenia 12/28/2015  . Hypercalcemia 09/04/2015  . Colon cancer screening 08/29/2015  . Arthritis 06/24/2012  . Hemiparesis affecting dominant side as late effect of cerebrovascular accident ([place]) 07/15/2011  . DYSLIPIDEMIA 12/05/2008  . Blood pressure elevated without history of HTN 12/05/2008  . Asthma 12/05/2008  . CHEST PAIN 12/05/2008    History obtained from: chart review and patient.  Tracie Brooks was referred by Erven Colla, DO. This is who took over when Dr. Wolfgang Phoenix retired. She has not seen Dr. Lovena Le in ages. She is now followed by Dr. Modena Nunnery, her Functional Medicine Provider in [place].   Tracie Brooks is a 75 y.o. female presenting for an evaluation of possible allergic rhinitis. She is specifically interested in environmental and food allergy testing. She reports  that she has coughing and allergy symptoms which have been ongoing for years. She has worsening congestion and coughing that seems to be worse in the spring time. She has never been allergy tested in the past.   She has a cough that is worse at night. This is almost exclusively at night. It is worse when she is congested. She started her antihistamines right away after the appointment and it gets completely better. She is   Food Allergy Symptom History: She reports allergies versus insensitivities to gluten, eggs, strawberries, dairy, and avacadoes. She reports a rash when she gets too much of these in her system. She has been using goat milk which she is tolerating, although she reports that she is having some reactions.   Her reactions start as a rash. Reports that the rash "exploded" during certain times. This is when she was referred to a functional medicine doctor - Dr. Lovena Le.   She reports that she has markers of "an autoimmune disorder". She does not have an autoimmune disorder. 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 She is taking "Liver Support" and a regimen of supplements to target these deficiencies. This has been a 2+ year journey for her. They are unsure of the triggers at this point in time. She is taking an antihistamine supplement. She taking Benadryl 28m at night as well as Claritin. She is on this once daily in combination with the Claritin twice daily. She is also on quercetin 30042mspread throughout the day.  She works as a Facilities manager and an Solicitor. She has issues by the end of her day when she goes to bed. She has to sleep sitting up. She reports that she has a lot of coughing at the end of the day and throughout the night.   Otherwise, there is no history of other atopic diseases, including asthma, food allergies, drug allergies, stinging insect allergies, eczema, urticaria or contact dermatitis. There is no significant infectious history.  Vaccinations are up to date.    Past Medical History: Patient Active Problem List   Diagnosis Date Noted  . Closed right hip fracture, initial encounter ([place]) 05/27/2018  . Osteopenia 12/28/2015  . Hypercalcemia 09/04/2015  . Colon cancer screening 08/29/2015  . Arthritis 06/24/2012  . Hemiparesis affecting dominant side as late effect of cerebrovascular accident ([place]) 07/15/2011  . DYSLIPIDEMIA 12/05/2008  . Blood pressure elevated without history of HTN 12/05/2008  . Asthma 12/05/2008  . CHEST PAIN 12/05/2008    Medication List:  Allergies as of 07/04/2020      Reactions   Beta Adrenergic Blockers Swelling   swelling   Levsin [hyoscyamine Sulfate] Other (See Comments)   Blurred vision   Neurontin [gabapentin] Other (See Comments)   FOGGY BRAIN/CONSTIPATION   Pravastatin Other (See Comments)   Joint pain   Sulfa Antibiotics Other (See Comments)   Mom had it and pt was told never to take it    Vicodin [hydrocodone-acetaminophen] Other (See Comments)   unknown   Zocor [simvastatin] Other (See Comments)   Joint pain   Ibuprofen Rash   Lidocaine Rash   cream      Medication List       Accurate as of July 04, 2020  4:52 PM. If you have any questions, ask your nurse or doctor.        Fish Oil 1000 MG Caps Take by mouth daily.   hydrochlorothiazide 12.5 MG tablet Commonly known as: HYDRODIURIL Take 12.5 mg by mouth daily.   lisinopril 10 MG tablet Commonly known as: ZESTRIL Take 1 tablet by mouth daily.   multivitamin capsule Take 1 capsule by mouth daily.   OVER THE COUNTER MEDICATION Pure lvr formula BId   OVER THE COUNTER MEDICATION Pure Nic Bid with meals   OVER THE COUNTER MEDICATION D3 K2 once per day   OVER THE COUNTER MEDICATION GI Detox one - twice daily   OVER THE COUNTER MEDICATION Pro aller Bid   OVER THE COUNTER MEDICATION GLA ( Gamma Linolenic Acid) Once daily   OVER THE COUNTER MEDICATION Collagen Powder once daily   OVER THE  COUNTER MEDICATION Take 1 tablet by mouth daily. Nitric Acid   PROBIOTIC DAILY PO Take 3 capsules by mouth daily.   TURMERIC PO Take 2 tablets by mouth daily.       Birth History: non-contributory  Developmental History: non-contributory  Past Surgical History: Past Surgical History:  Procedure Laterality Date  . ABDOMINAL HYSTERECTOMY    . BIOPSY BREAST  1985  . COLONOSCOPY    . COLONOSCOPY N/A 09/03/2015   Procedure: COLONOSCOPY;  Surgeon: Danie Binder, MD;  [facility];  Service: Endoscopy;  Laterality: N/A;  1400  . KNEE ARTHROSCOPY     right 1982  . TOTAL HIP ARTHROPLASTY Right 05/28/2018   Procedure: TOTAL HIP ARTHROPLASTY ANTERIOR APPROACH;  Surgeon: Renette Butters, MD;  Location: [facility];  Service: Orthopedics;  Laterality: Right;     Family History: Family History  Problem Relation  Age of Onset  . Hypertension Mother 25  . Hyperlipidemia Mother   . Arthritis Mother   . Hypertension Father 81  . Heart disease Father        Pacemaker  . Dementia Father   . Heart disease Sister   . Congestive Heart Failure Brother   . Breast cancer Sister   . Colon polyps Neg Hx   . Colon cancer Neg Hx      Social History: Erina lives at home with her family.  She lives in a house that is 75 years old.  There are hardwoods in the family room and carpeting in the bedrooms.  She has electric heating and central cooling.  There are 2 small dog and 1 cat inside of the house.  There are no animals outside of the home.  There are no dust mite covers on the bedding.  There is no tobacco exposure.  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  She is not exposed to fumes, chemicals, or dust.  She does have a HEPA filter in her bedroom.  They do not live near an interstate or industrial area.   Review of Systems  Constitutional: Negative.  Negative for fever, malaise/fatigue and weight loss.  HENT: Positive for congestion and sinus  pain. Negative for ear discharge and ear pain.        Positive for postnasal drip.   Eyes: Negative for pain, discharge and redness.  Respiratory: Positive for cough and sputum production. Negative for shortness of breath and wheezing.   Cardiovascular: Negative.  Negative for chest pain and palpitations.  Gastrointestinal: Negative for abdominal pain, constipation, diarrhea, heartburn, nausea and vomiting.  Skin: Negative.  Negative for itching and rash.  Neurological: Negative for dizziness and headaches.  Endo/Heme/Allergies: Negative for environmental allergies. Does not bruise/bleed easily.       Objective:   Blood pressure 132/70, pulse 81, temperature 98 F (36.7 C), temperature source Temporal, resp. rate 16, height 5' 6"  (1.676 m), weight 184 lb 3.2 oz (83.6 kg), SpO2 96 %. Body mass index is 29.73 kg/m.   Physical Exam:   Physical Exam Constitutional:      Appearance: She is well-developed.     Comments: Very pleasant and talkative female.   HENT:     Head: Normocephalic and atraumatic.     Right Ear: Tympanic membrane, ear canal and external ear normal. No drainage, swelling or tenderness. Tympanic membrane is not injected, scarred, erythematous, retracted or bulging.     Left Ear: Tympanic membrane, ear canal and external ear normal. No drainage, swelling or tenderness. Tympanic membrane is not injected, scarred, erythematous, retracted or bulging.     Ears:     Comments: Some cerumen bilaterally. TMs pearlescent bilaterally. No scarring.    Nose: No nasal deformity, septal deviation, mucosal edema or rhinorrhea.     Right Turbinates: Enlarged and swollen.     Left Turbinates: Enlarged and swollen.     Right Sinus: No maxillary sinus tenderness or frontal sinus tenderness.     Left Sinus: No maxillary sinus tenderness or frontal sinus tenderness.     Comments: No epistaxis. No nasal polyps.     Mouth/Throat:     Mouth: Mucous membranes are not pale and not dry.      Pharynx: Uvula midline.  Eyes:     General:        Right eye: No discharge.  Left eye: No discharge.     Conjunctiva/sclera: Conjunctivae normal.     Right eye: Right conjunctiva is not injected. No chemosis.    Left eye: Left conjunctiva is not injected. No chemosis.    Pupils: Pupils are equal, round, and reactive to light.  Cardiovascular:     Rate and Rhythm: Normal rate and regular rhythm.     Heart sounds: Normal heart sounds.  Pulmonary:     Effort: Pulmonary effort is normal. No tachypnea, accessory muscle usage or respiratory distress.     Breath sounds: Normal breath sounds. No wheezing, rhonchi or rales.     Comments: Moving air well in all lung fields. No increased work of breathing noted. No crackles or wheezes. Breathing very comfortably.  Chest:     Chest wall: No tenderness.  Abdominal:     Tenderness: There is no abdominal tenderness. There is no guarding or rebound.  Lymphadenopathy:     Head:     Right side of head: No submandibular, tonsillar or occipital adenopathy.     Left side of head: No submandibular, tonsillar or occipital adenopathy.     Cervical: No cervical adenopathy.  Skin:    General: Skin is warm.     Capillary Refill: Capillary refill takes less than 2 seconds.     Coloration: Skin is not pale.     Findings: No abrasion, erythema, petechiae or rash. Rash is not papular, urticarial or vesicular.  Neurological:     Mental Status: She is alert.  Psychiatric:        Behavior: Behavior is cooperative.      Diagnostic studies:   Spirometry: results normal (FEV1: 1.74/74%, FVC: 2.06/66%, FEV1/FVC: 84%).    Spirometry consistent with normal pattern.   Allergy Studies:     Airborne Adult Perc - 07/04/20 1429    Time Antigen Placed 1429    Allergen Manufacturer Lavella Hammock    Location Back    Number of Test 59    Panel 1 Select    1. Control-Buffer 50% Glycerol Negative    2. Control-Histamine 1 mg/ml 2+    3. Albumin saline Negative     4. Ingram Negative    5. [place] Negative    6. Johnson Negative    7. [place] Blue Negative    8. Meadow Fescue Negative    9. Perennial Rye Negative    10. Sweet Vernal Negative    11. Timothy Negative    12. Cocklebur Negative    13. Burweed Marshelder Negative    14. Ragweed, short Negative    15. Ragweed, Giant Negative    16. Plantain,  English Negative    17. Lamb's Quarters Negative    18. Sheep Sorrell Negative    19. Rough Pigweed Negative    20. Marsh Elder, Rough Negative    21. Mugwort, Common Negative    22. Ash mix Negative    23. Birch mix Negative    24. Beech American Negative    25. Box, Elder Negative    26. Cedar, red Negative    27. Cottonwood, [place] Negative    28. Elm mix Negative    29. Hickory Negative    30. Maple mix Negative    31. Oak, [place] mix Negative    32. Pecan Pollen Negative    33. Pine mix Negative    34. Sycamore Eastern Negative    35. Elk, Black Pollen Negative    36. Alternaria alternata Negative  37. Cladosporium Herbarum Negative    38. Aspergillus mix Negative    39. Penicillium mix Negative    40. Bipolaris sorokiniana (Helminthosporium) Negative    41. Drechslera spicifera (Curvularia) Negative    42. Mucor plumbeus Negative    43. Fusarium moniliforme Negative    44. Aureobasidium pullulans (pullulara) Negative    45. Rhizopus oryzae Negative    46. Botrytis cinera Negative    47. Epicoccum nigrum Negative    48. Phoma betae Negative    49. Candida Albicans Negative    50. Trichophyton mentagrophytes Negative    51. Mite, D Farinae  5,000 AU/ml Negative    52. Mite, D Pteronyssinus  5,000 AU/ml Negative    53. Cat Hair 10,000 BAU/ml Negative    54.  Dog Epithelia Negative    55. Mixed Feathers Negative    56. Horse Epithelia Negative    57. Cockroach, German Negative    58. Mouse Negative    59. Tobacco Leaf Negative          Food Adult Perc - 07/04/20 1500    1. Peanut Negative    2. Soybean  Negative    3. Wheat Negative    4. Sesame Negative    5. Milk, cow Negative    6. Egg White, Chicken Negative    7. Casein Negative    10. Cashew Negative    11. Pecan Food Negative    12. [place] Negative    13. Almond Negative    14. Hazelnut Negative    15. Bol[place] nut Negative    36. Saccharomyces Cerevisiae  Negative    37. Pork Negative    38. Kuwait Meat Negative    39. Chicken Meat Negative    40. Beef Negative    41. Lamb Negative    42. Tomato Negative    45. Pea, Green/English Negative    46. Navy Bean Negative    48. Avocado Negative    60. Strawberry Negative    64. Chocolate/Cacao bean Negative    67. Cinnamon Negative    68. Nutmeg Negative    71. Pepper, black Negative           Allergy testing results were read and interpreted by myself, documented by clinical staff.         Salvatore Marvel, MD [facility]

## 2020-07-06 ENCOUNTER — Encounter: Payer: Self-pay | Admitting: Allergy & Immunology

## 2020-07-06 NOTE — Progress Notes (Signed)
Done

## 2020-09-24 ENCOUNTER — Other Ambulatory Visit (HOSPITAL_COMMUNITY): Payer: Self-pay | Admitting: Obstetrics & Gynecology

## 2020-09-24 DIAGNOSIS — Z1231 Encounter for screening mammogram for malignant neoplasm of breast: Secondary | ICD-10-CM

## 2020-09-27 ENCOUNTER — Other Ambulatory Visit: Payer: Self-pay

## 2020-09-27 ENCOUNTER — Ambulatory Visit (HOSPITAL_COMMUNITY)
Admission: RE | Admit: 2020-09-27 | Discharge: 2020-09-27 | Disposition: A | Discharge status: Home / Self Care | Payer: Medicare HMO | Source: Ambulatory Visit | Attending: Obstetrics & Gynecology | Admitting: Obstetrics & Gynecology

## 2020-09-27 DIAGNOSIS — Z1231 Encounter for screening mammogram for malignant neoplasm of breast: Secondary | ICD-10-CM | POA: Insufficient documentation

## 2020-11-20 DIAGNOSIS — D649 Anemia, unspecified: Secondary | ICD-10-CM | POA: Diagnosis not present

## 2020-11-20 DIAGNOSIS — E559 Vitamin D deficiency, unspecified: Secondary | ICD-10-CM | POA: Diagnosis not present

## 2020-11-20 DIAGNOSIS — E039 Hypothyroidism, unspecified: Secondary | ICD-10-CM | POA: Diagnosis not present

## 2020-11-20 DIAGNOSIS — D8940 Mast cell activation, unspecified: Secondary | ICD-10-CM | POA: Diagnosis not present

## 2021-03-04 DIAGNOSIS — H35032 Hypertensive retinopathy, left eye: Secondary | ICD-10-CM | POA: Diagnosis not present

## 2021-03-04 DIAGNOSIS — H43393 Other vitreous opacities, bilateral: Secondary | ICD-10-CM | POA: Diagnosis not present

## 2021-03-04 DIAGNOSIS — H04123 Dry eye syndrome of bilateral lacrimal glands: Secondary | ICD-10-CM | POA: Diagnosis not present

## 2021-03-04 DIAGNOSIS — H2513 Age-related nuclear cataract, bilateral: Secondary | ICD-10-CM | POA: Diagnosis not present

## 2021-03-04 DIAGNOSIS — H524 Presbyopia: Secondary | ICD-10-CM | POA: Diagnosis not present

## 2021-04-05 DIAGNOSIS — E063 Autoimmune thyroiditis: Secondary | ICD-10-CM | POA: Diagnosis not present

## 2021-04-05 DIAGNOSIS — E039 Hypothyroidism, unspecified: Secondary | ICD-10-CM | POA: Diagnosis not present

## 2021-04-05 DIAGNOSIS — B2790 Infectious mononucleosis, unspecified without complication: Secondary | ICD-10-CM | POA: Diagnosis not present

## 2021-04-05 DIAGNOSIS — I10 Essential (primary) hypertension: Secondary | ICD-10-CM | POA: Diagnosis not present

## 2021-04-18 DIAGNOSIS — I10 Essential (primary) hypertension: Secondary | ICD-10-CM | POA: Diagnosis not present

## 2021-04-18 DIAGNOSIS — E039 Hypothyroidism, unspecified: Secondary | ICD-10-CM | POA: Diagnosis not present

## 2021-04-18 DIAGNOSIS — E063 Autoimmune thyroiditis: Secondary | ICD-10-CM | POA: Diagnosis not present

## 2021-04-23 DIAGNOSIS — E559 Vitamin D deficiency, unspecified: Secondary | ICD-10-CM | POA: Diagnosis not present

## 2021-04-23 DIAGNOSIS — D550 Anemia due to glucose-6-phosphate dehydrogenase [G6PD] deficiency: Secondary | ICD-10-CM | POA: Diagnosis not present

## 2021-04-23 DIAGNOSIS — D649 Anemia, unspecified: Secondary | ICD-10-CM | POA: Diagnosis not present

## 2021-04-23 DIAGNOSIS — R5383 Other fatigue: Secondary | ICD-10-CM | POA: Diagnosis not present

## 2021-07-30 IMAGING — MG MM DIGITAL SCREENING BILAT W/ TOMO AND CAD
8 series · 9 of 24 positions shown · non-contrast
Comparison: Previous exam(s).

CLINICAL DATA: Screening.

EXAM:
DIGITAL SCREENING BILATERAL MAMMOGRAM WITH TOMOSYNTHESIS AND CAD
TECHNIQUE: Bilateral screening digital craniocaudal and mediolateral oblique
mammograms were obtained. Bilateral screening digital breast
tomosynthesis was performed. The images were evaluated with
computer-aided detection.

[R CC synth-2D]
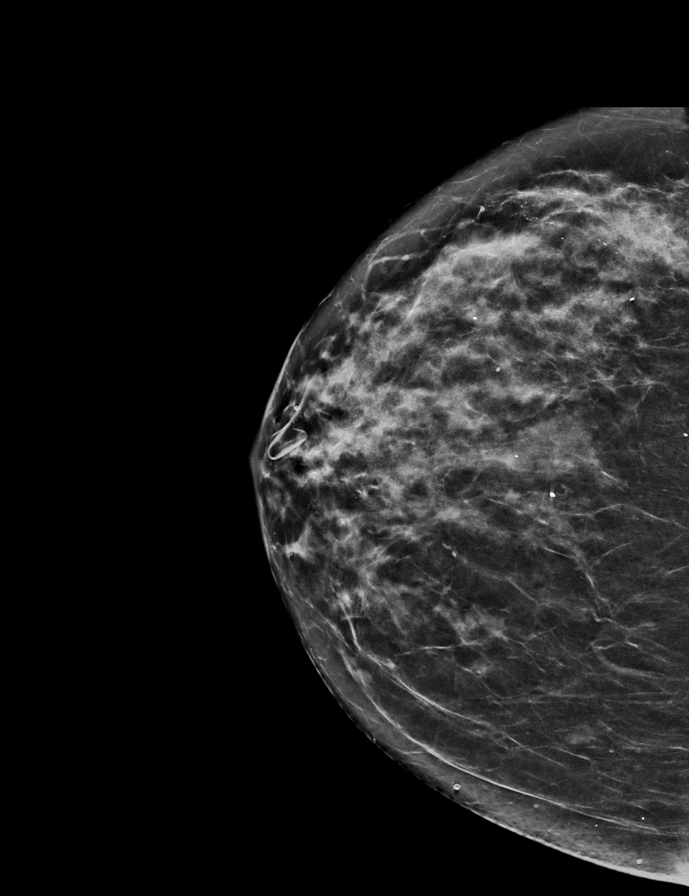

[L CC synth-2D]
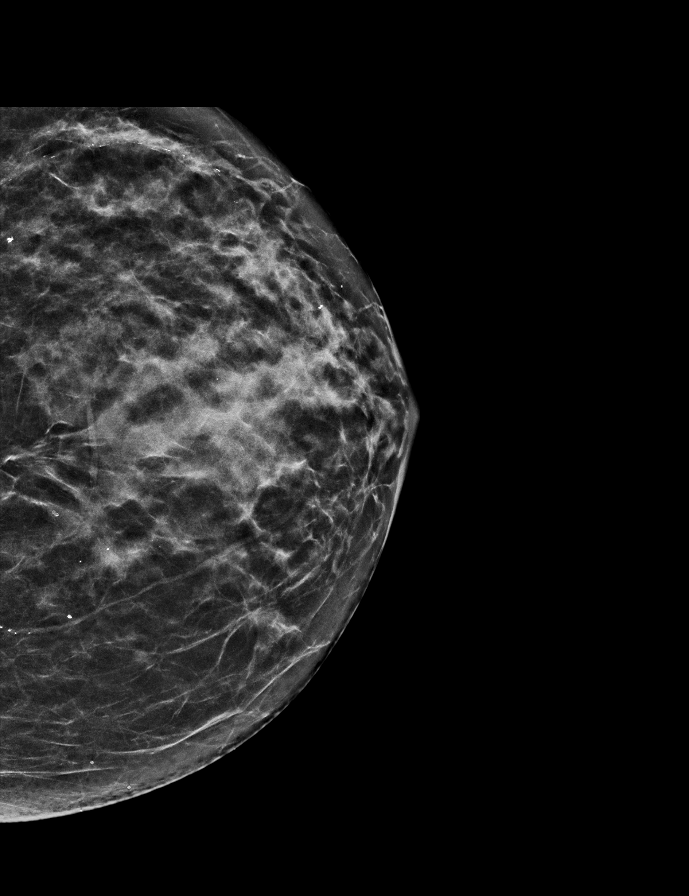

[L MLO synth-2D]
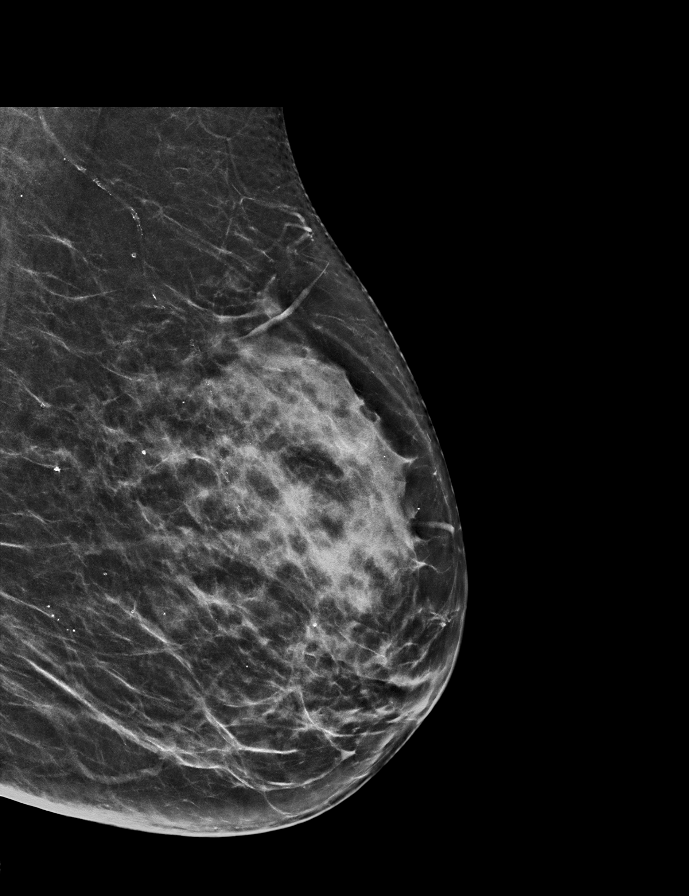

[R MLO synth-2D]
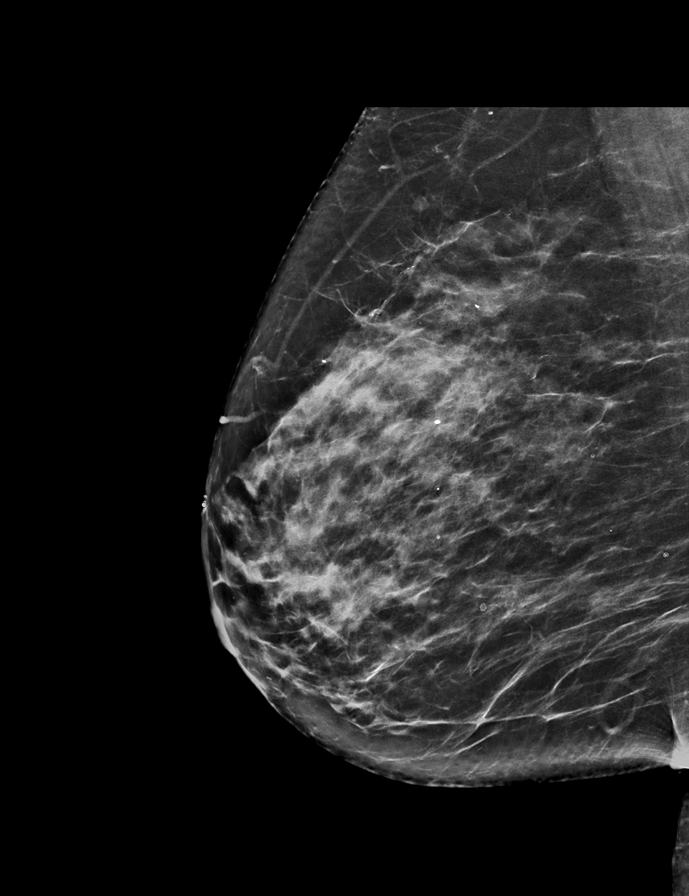

[R CC tomo · 2 of 62 frames shown]
[frame 21/62]
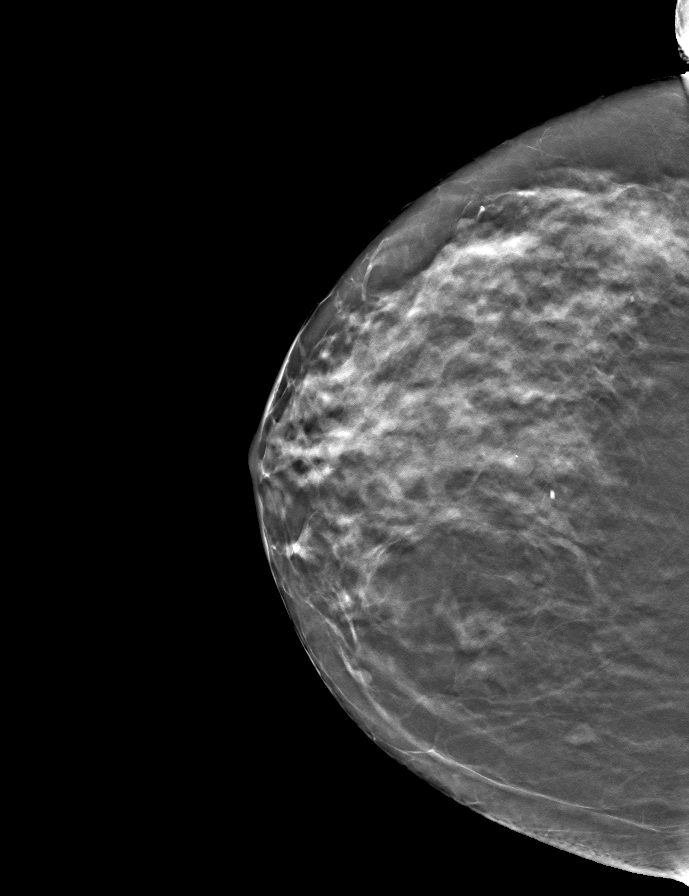
[frame 31/62]
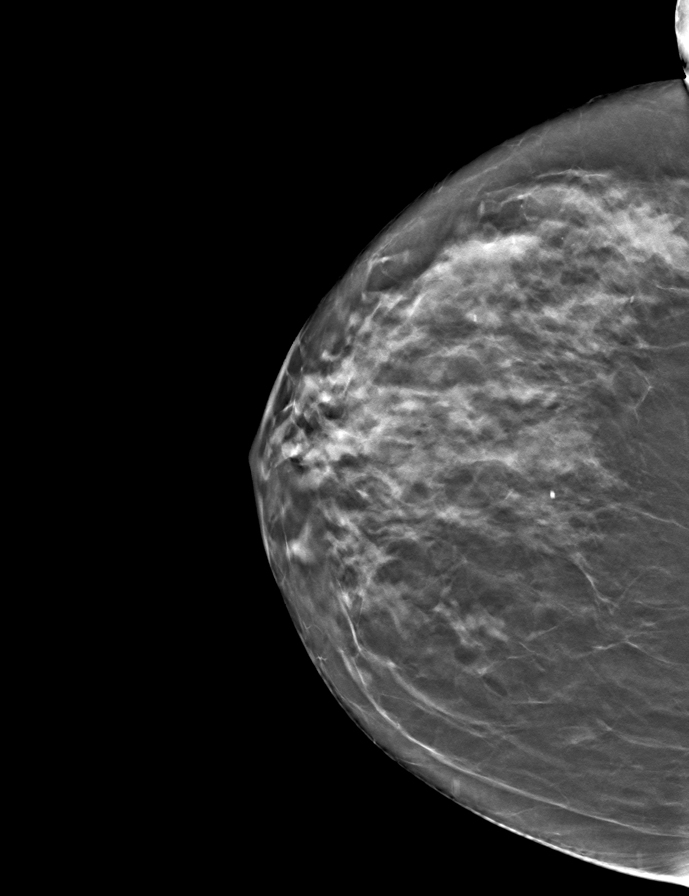

[R MLO tomo · tomo slice 36/71.0]
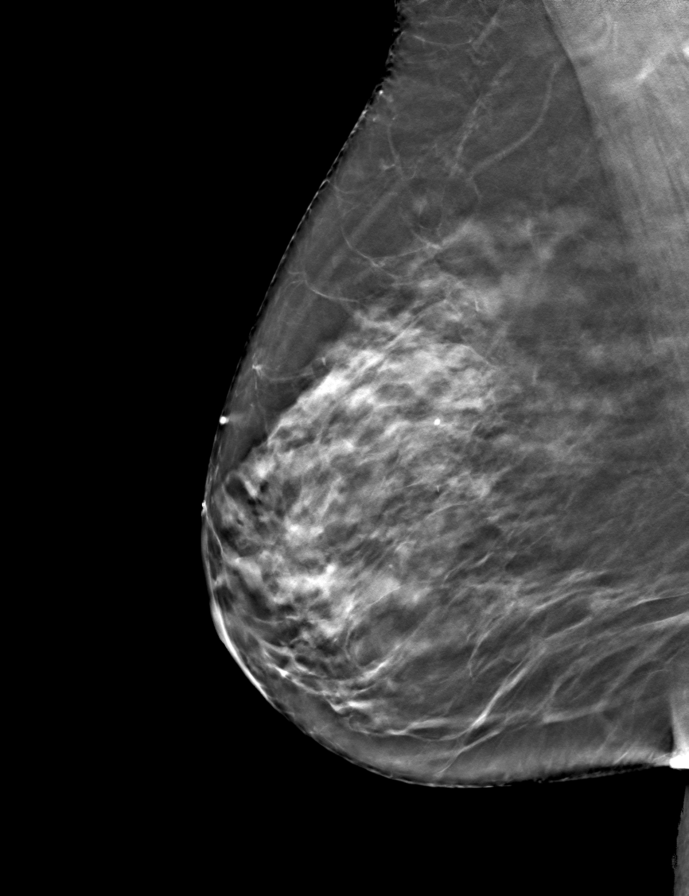

[L MLO tomo · tomo slice 33/65.0]
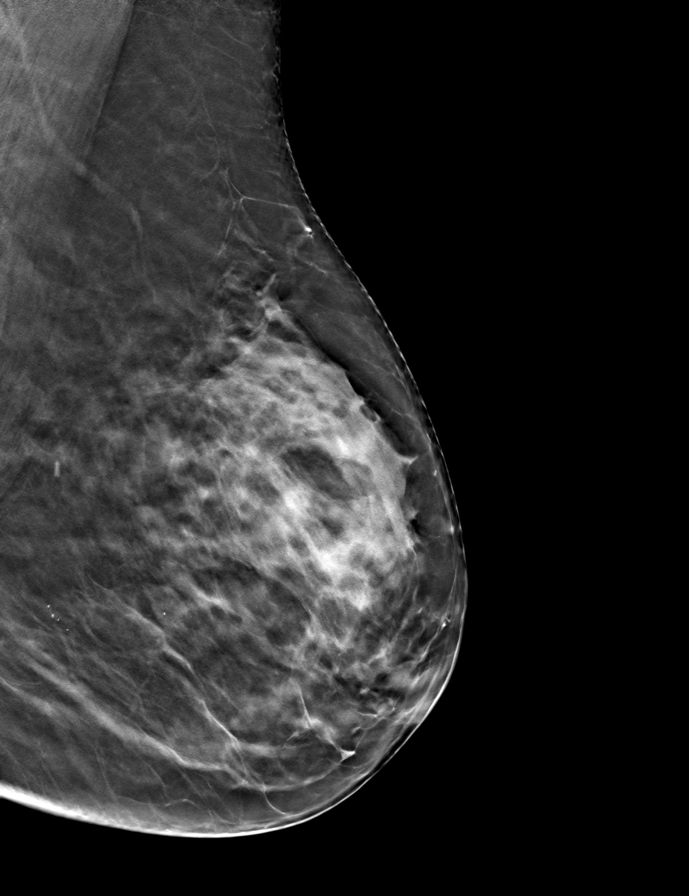

[L CC tomo · tomo slice 27/54.0]
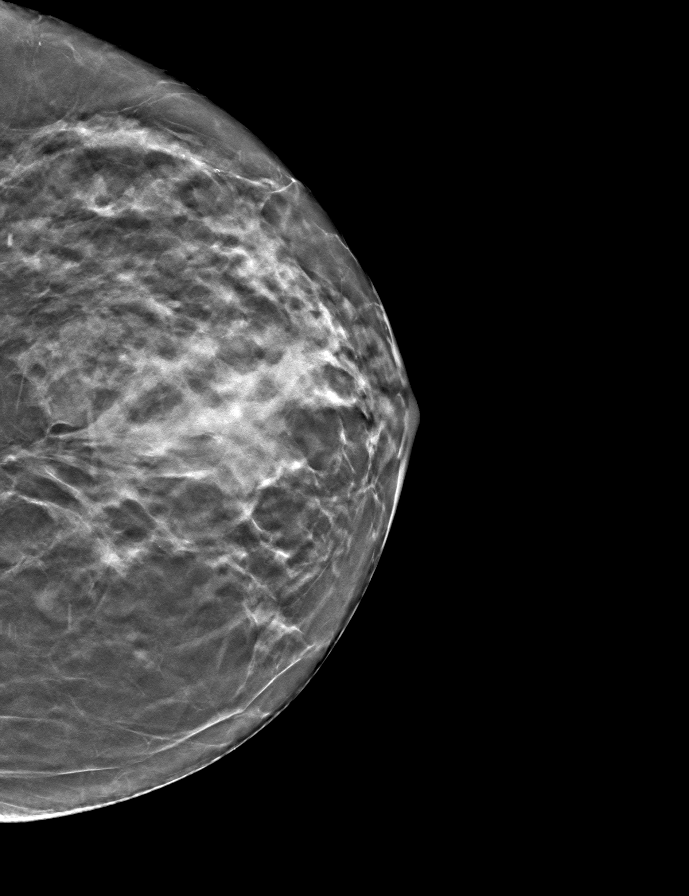

[9 of 24 positions shown; findings below may reference images not displayed]

ACR Breast Density Category c: The breast tissue is heterogeneously
dense, which may obscure small masses.
FINDINGS: There are no findings suspicious for malignancy.
IMPRESSION: No mammographic evidence of malignancy. A result letter of this
screening mammogram will be mailed directly to the patient.

RECOMMENDATION:
Screening mammogram in one year. (Code:Q3-W-BC3)

BI-RADS CATEGORY  1: Negative.

## 2021-08-05 DIAGNOSIS — E559 Vitamin D deficiency, unspecified: Secondary | ICD-10-CM | POA: Diagnosis not present

## 2021-08-05 DIAGNOSIS — B2790 Infectious mononucleosis, unspecified without complication: Secondary | ICD-10-CM | POA: Diagnosis not present

## 2021-08-05 DIAGNOSIS — L2084 Intrinsic (allergic) eczema: Secondary | ICD-10-CM | POA: Diagnosis not present

## 2021-08-05 DIAGNOSIS — K529 Noninfective gastroenteritis and colitis, unspecified: Secondary | ICD-10-CM | POA: Diagnosis not present

## 2021-08-05 DIAGNOSIS — I10 Essential (primary) hypertension: Secondary | ICD-10-CM | POA: Diagnosis not present

## 2021-08-05 DIAGNOSIS — E611 Iron deficiency: Secondary | ICD-10-CM | POA: Diagnosis not present

## 2021-08-05 DIAGNOSIS — H6521 Chronic serous otitis media, right ear: Secondary | ICD-10-CM | POA: Diagnosis not present

## 2021-08-05 DIAGNOSIS — I00 Rheumatic fever without heart involvement: Secondary | ICD-10-CM | POA: Diagnosis not present

## 2021-08-05 DIAGNOSIS — D8940 Mast cell activation, unspecified: Secondary | ICD-10-CM | POA: Diagnosis not present

## 2021-08-05 DIAGNOSIS — D519 Vitamin B12 deficiency anemia, unspecified: Secondary | ICD-10-CM | POA: Diagnosis not present

## 2021-08-05 DIAGNOSIS — M151 Heberden's nodes (with arthropathy): Secondary | ICD-10-CM | POA: Diagnosis not present

## 2021-08-05 DIAGNOSIS — E063 Autoimmune thyroiditis: Secondary | ICD-10-CM | POA: Diagnosis not present

## 2021-08-14 DIAGNOSIS — M9902 Segmental and somatic dysfunction of thoracic region: Secondary | ICD-10-CM | POA: Diagnosis not present

## 2021-08-14 DIAGNOSIS — M9903 Segmental and somatic dysfunction of lumbar region: Secondary | ICD-10-CM | POA: Diagnosis not present

## 2021-08-14 DIAGNOSIS — M62830 Muscle spasm of back: Secondary | ICD-10-CM | POA: Diagnosis not present

## 2021-08-14 DIAGNOSIS — M9905 Segmental and somatic dysfunction of pelvic region: Secondary | ICD-10-CM | POA: Diagnosis not present

## 2021-08-23 DIAGNOSIS — M9902 Segmental and somatic dysfunction of thoracic region: Secondary | ICD-10-CM | POA: Diagnosis not present

## 2021-08-23 DIAGNOSIS — M9905 Segmental and somatic dysfunction of pelvic region: Secondary | ICD-10-CM | POA: Diagnosis not present

## 2021-08-23 DIAGNOSIS — M9903 Segmental and somatic dysfunction of lumbar region: Secondary | ICD-10-CM | POA: Diagnosis not present

## 2021-08-23 DIAGNOSIS — M62830 Muscle spasm of back: Secondary | ICD-10-CM | POA: Diagnosis not present

## 2021-09-02 DIAGNOSIS — Z6831 Body mass index (BMI) 31.0-31.9, adult: Secondary | ICD-10-CM | POA: Diagnosis not present

## 2021-09-02 DIAGNOSIS — I10 Essential (primary) hypertension: Secondary | ICD-10-CM | POA: Diagnosis not present

## 2021-09-02 DIAGNOSIS — R109 Unspecified abdominal pain: Secondary | ICD-10-CM | POA: Diagnosis not present

## 2021-09-02 DIAGNOSIS — E669 Obesity, unspecified: Secondary | ICD-10-CM | POA: Diagnosis not present

## 2021-09-02 DIAGNOSIS — E039 Hypothyroidism, unspecified: Secondary | ICD-10-CM | POA: Diagnosis not present

## 2021-09-06 DIAGNOSIS — R7301 Impaired fasting glucose: Secondary | ICD-10-CM | POA: Diagnosis not present

## 2021-09-06 DIAGNOSIS — I10 Essential (primary) hypertension: Secondary | ICD-10-CM | POA: Diagnosis not present

## 2021-09-06 DIAGNOSIS — E039 Hypothyroidism, unspecified: Secondary | ICD-10-CM | POA: Diagnosis not present

## 2021-09-13 DIAGNOSIS — Z6831 Body mass index (BMI) 31.0-31.9, adult: Secondary | ICD-10-CM | POA: Diagnosis not present

## 2021-09-13 DIAGNOSIS — N1831 Chronic kidney disease, stage 3a: Secondary | ICD-10-CM | POA: Diagnosis not present

## 2021-09-13 DIAGNOSIS — E039 Hypothyroidism, unspecified: Secondary | ICD-10-CM | POA: Diagnosis not present

## 2021-09-13 DIAGNOSIS — E669 Obesity, unspecified: Secondary | ICD-10-CM | POA: Diagnosis not present

## 2021-09-13 DIAGNOSIS — I10 Essential (primary) hypertension: Secondary | ICD-10-CM | POA: Diagnosis not present

## 2021-09-13 DIAGNOSIS — E785 Hyperlipidemia, unspecified: Secondary | ICD-10-CM | POA: Diagnosis not present

## 2021-09-13 DIAGNOSIS — R109 Unspecified abdominal pain: Secondary | ICD-10-CM | POA: Diagnosis not present

## 2021-10-11 DIAGNOSIS — N951 Menopausal and female climacteric states: Secondary | ICD-10-CM | POA: Diagnosis not present

## 2021-10-11 DIAGNOSIS — Z79890 Hormone replacement therapy (postmenopausal): Secondary | ICD-10-CM | POA: Diagnosis not present

## 2021-10-11 DIAGNOSIS — E559 Vitamin D deficiency, unspecified: Secondary | ICD-10-CM | POA: Diagnosis not present

## 2021-10-11 DIAGNOSIS — I10 Essential (primary) hypertension: Secondary | ICD-10-CM | POA: Diagnosis not present

## 2021-10-11 DIAGNOSIS — Z7689 Persons encountering health services in other specified circumstances: Secondary | ICD-10-CM | POA: Diagnosis not present

## 2021-10-11 DIAGNOSIS — H6521 Chronic serous otitis media, right ear: Secondary | ICD-10-CM | POA: Diagnosis not present

## 2021-10-11 DIAGNOSIS — R5383 Other fatigue: Secondary | ICD-10-CM | POA: Diagnosis not present

## 2021-10-11 DIAGNOSIS — D8940 Mast cell activation, unspecified: Secondary | ICD-10-CM | POA: Diagnosis not present

## 2021-10-11 DIAGNOSIS — I00 Rheumatic fever without heart involvement: Secondary | ICD-10-CM | POA: Diagnosis not present

## 2021-10-11 DIAGNOSIS — E063 Autoimmune thyroiditis: Secondary | ICD-10-CM | POA: Diagnosis not present

## 2021-10-11 DIAGNOSIS — E611 Iron deficiency: Secondary | ICD-10-CM | POA: Diagnosis not present

## 2021-10-11 DIAGNOSIS — E538 Deficiency of other specified B group vitamins: Secondary | ICD-10-CM | POA: Diagnosis not present

## 2021-10-11 DIAGNOSIS — L2084 Intrinsic (allergic) eczema: Secondary | ICD-10-CM | POA: Diagnosis not present

## 2021-10-11 DIAGNOSIS — K529 Noninfective gastroenteritis and colitis, unspecified: Secondary | ICD-10-CM | POA: Diagnosis not present

## 2021-12-20 DIAGNOSIS — E039 Hypothyroidism, unspecified: Secondary | ICD-10-CM | POA: Diagnosis not present

## 2021-12-20 DIAGNOSIS — E063 Autoimmune thyroiditis: Secondary | ICD-10-CM | POA: Diagnosis not present

## 2021-12-20 DIAGNOSIS — I10 Essential (primary) hypertension: Secondary | ICD-10-CM | POA: Diagnosis not present

## 2021-12-24 ENCOUNTER — Ambulatory Visit: Payer: Self-pay | Admitting: Licensed Clinical Social Worker

## 2021-12-24 NOTE — Patient Instructions (Signed)
Visit Information  Thank you for taking time to visit with me today. Please don't hesitate to contact me if I can be of assistance to you.   Following are the goals we discussed today:   Goals Addressed             This Visit's Progress    COMPLETED: Care Coordination Activities No Follow up Required       Care Coordination Interventions: Reviewed Care Coordination Services:declined Discussed benefits of Medicare Annual Wellness Visit: completed at PCP's office           Patient verbalizes understanding of instructions and care plan provided today and agrees to view in [place]. Active MyChart status and patient understanding of how to access instructions and care plan via MyChart confirmed with patient.     No further follow up required: by Care Coordination at this time  Care Coordination team works in collaboration with your primary care doctor.  Please call [phone] if you would like to schedule a phone appointment with a Nurse or Social work Care Coordinator to assist with navigating your physical and mental health needs.     Casimer Lanius, [place] [phone]

## 2021-12-24 NOTE — Patient Outreach (Signed)
  Care Coordination  Initial Visit Note   12/24/2021 Name: AMBRI MILTNER MRN: [id number] DOB: [birth year]  [name] is a 76 y.o. year old female who sees Erven Colla, DO for primary care. I spoke with  [name] by phone today.  What matters to the patients health and wellness today?   Patient reports no concerns or needs from Care Coordination team with health and wellness related to physical or mental heath. .  No longer at [facility]             This Visit's Progress    COMPLETED: Care Coordination Activities No Follow up Required       Care Coordination Interventions: Reviewed Care Coordination Services:declined Discussed benefits of Medicare Annual Wellness Visit: completed at PCP's office            SDOH assessments and interventions completed:  No     Care Coordination Interventions Activated:  Yes  Care Coordination Interventions:  Yes, provided   Follow up plan: No further intervention required.   Encounter Outcome:  Pt. Visit Completed   Casimer Lanius, [place] [phone]

## 2021-12-26 ENCOUNTER — Other Ambulatory Visit: Payer: Self-pay | Admitting: Internal Medicine

## 2021-12-26 ENCOUNTER — Other Ambulatory Visit (HOSPITAL_COMMUNITY): Payer: Self-pay | Admitting: Internal Medicine

## 2021-12-26 DIAGNOSIS — I10 Essential (primary) hypertension: Secondary | ICD-10-CM | POA: Diagnosis not present

## 2021-12-26 DIAGNOSIS — N1831 Chronic kidney disease, stage 3a: Secondary | ICD-10-CM | POA: Diagnosis not present

## 2021-12-26 DIAGNOSIS — R109 Unspecified abdominal pain: Secondary | ICD-10-CM | POA: Diagnosis not present

## 2022-01-08 ENCOUNTER — Encounter (HOSPITAL_COMMUNITY): Payer: Self-pay

## 2022-01-08 ENCOUNTER — Ambulatory Visit (HOSPITAL_COMMUNITY): Admission: RE | Admit: 2022-01-08 | Payer: Medicare HMO | Source: Ambulatory Visit

## 2022-01-16 ENCOUNTER — Ambulatory Visit (HOSPITAL_COMMUNITY)
Admission: RE | Admit: 2022-01-16 | Discharge: 2022-01-16 | Disposition: A | Discharge status: Home / Self Care | Payer: Medicare HMO | Source: Ambulatory Visit | Attending: Internal Medicine | Admitting: Internal Medicine

## 2022-01-16 DIAGNOSIS — R109 Unspecified abdominal pain: Secondary | ICD-10-CM | POA: Diagnosis not present

## 2022-01-16 DIAGNOSIS — R1012 Left upper quadrant pain: Secondary | ICD-10-CM | POA: Diagnosis not present

## 2022-01-16 DIAGNOSIS — K5730 Diverticulosis of large intestine without perforation or abscess without bleeding: Secondary | ICD-10-CM | POA: Diagnosis not present

## 2022-01-16 MED ORDER — IOHEXOL 300 MG/ML  SOLN
100.0000 mL | Freq: Once | INTRAMUSCULAR | Status: AC | PRN
  Administered 2022-01-16: 100 mL via INTRAVENOUS

## 2022-01-17 DIAGNOSIS — Z0000 Encounter for general adult medical examination without abnormal findings: Secondary | ICD-10-CM | POA: Diagnosis not present

## 2022-01-27 DIAGNOSIS — K5730 Diverticulosis of large intestine without perforation or abscess without bleeding: Secondary | ICD-10-CM | POA: Diagnosis not present

## 2022-01-27 DIAGNOSIS — I700 Atherosclerosis of aorta: Secondary | ICD-10-CM | POA: Diagnosis not present

## 2022-01-27 DIAGNOSIS — R109 Unspecified abdominal pain: Secondary | ICD-10-CM | POA: Diagnosis not present

## 2022-01-27 DIAGNOSIS — I10 Essential (primary) hypertension: Secondary | ICD-10-CM | POA: Diagnosis not present

## 2022-01-27 DIAGNOSIS — D1771 Benign lipomatous neoplasm of kidney: Secondary | ICD-10-CM | POA: Diagnosis not present

## 2022-02-03 ENCOUNTER — Encounter: Payer: Self-pay | Admitting: Internal Medicine

## 2022-02-24 ENCOUNTER — Ambulatory Visit
Admission: EM | Admit: 2022-02-24 | Discharge: 2022-02-24 | Disposition: A | Discharge status: Home / Self Care | Payer: Medicare HMO

## 2022-02-24 ENCOUNTER — Ambulatory Visit: Payer: Medicare HMO

## 2022-02-24 DIAGNOSIS — R058 Other specified cough: Secondary | ICD-10-CM | POA: Diagnosis not present

## 2022-02-24 MED ORDER — BENZONATATE 100 MG PO CAPS: 100.0000 mg | ORAL_CAPSULE | Freq: Three times a day (TID) | ORAL | 0 refills | Status: DC | PRN

## 2022-02-24 NOTE — Discharge Instructions (Signed)
I suspect you have a post viral cough.  This should improve over the next couple of weeks.    Some things that can make you feel better are: - Increased rest - Increasing fluid with water/sugar free electrolytes - Acetaminophen and ibuprofen as needed for fever/pain - Salt water gargling, chloraseptic spray and throat lozenges - OTC guaifenesin (Mucinex) 600 mg twice daily - Saline sinus flushes or a neti pot - Humidifying the air -Tessalon Perles as needed for dry cough

## 2022-02-24 NOTE — ED Triage Notes (Signed)
Pt reports cough, worse at night since 11/115/23. Reports she vomited sometimes due to cough.OTC meds gives no relief.

## 2022-02-24 NOTE — ED Provider Notes (Signed)
[facility]    CSN: [id number] Arrival date & time: 02/24/22  1322      History   Chief Complaint Chief Complaint  Patient presents with   Cough         HPI Tracie Brooks is a 76 y.o. female.   Patient presents today for cough that is worse at nighttime when laying down.  Reports Tracie Brooks had a cold a couple of weeks ago and has had this cough lingering ever since.  Tracie Brooks denies fevers, shortness of breath, wheezing, chest pain or tightness, pain with inspiration, nasal congestion, sore throat, sinus pressure or headache, ear pain, abdominal pain, nausea, diarrhea, loss of taste or smell, and new rash.  Tracie Brooks does endorse a cough that is congested and dry at times, chest congestion in the upper chest/lower throat, runny nose, postnasal drainage, sneezing, vomiting from coughing so hard, and fatigue.  Has been taking DayQuil and NyQuil for symptoms without much improvement.    Reports her blood pressure is elevated today-reports it usually runs 140s over 70s.  Does not check BP at home regularly.    Past Medical History:  Diagnosis Date   Allergy    Asthma    Chest pain    Dyslipidemia    Hypertension    IBS (irritable bowel syndrome)    Murmur     Patient Active Problem List   Diagnosis Date Noted   Closed right hip fracture, initial encounter ([place]) 05/27/2018   Osteopenia 12/28/2015   Hypercalcemia 09/04/2015   Colon cancer screening 08/29/2015   Arthritis 06/24/2012   Hemiparesis affecting dominant side as late effect of cerebrovascular accident ([place]) 07/15/2011   DYSLIPIDEMIA 12/05/2008   Blood pressure elevated without history of HTN 12/05/2008   Asthma 12/05/2008   CHEST PAIN 12/05/2008    Past Surgical History:  Procedure Laterality Date   ABDOMINAL HYSTERECTOMY     BIOPSY BREAST  1985   COLONOSCOPY     COLONOSCOPY N/A 09/03/2015   Procedure: COLONOSCOPY;  Surgeon: Danie Binder, MD;  [facility];  Service: Endoscopy;  Laterality: N/A;   1400   KNEE ARTHROSCOPY     right [place] Right 05/28/2018   Procedure: TOTAL HIP ARTHROPLASTY ANTERIOR APPROACH;  Surgeon: Renette Butters, MD;  Location: [facility];  Service: Orthopedics;  Laterality: Right;    OB History   No obstetric history on file.      Home Medications    Prior to Admission medications   Medication Sig Start Date End Date Taking? Authorizing Provider  ARMOUR THYROID 30 MG tablet Take 30 mg by mouth daily. 10/31/21  Yes [provider]  benzonatate (TESSALON) 100 MG capsule Take 1 capsule (100 mg total) by mouth 3 (three) times daily as needed for cough. Do not take with alcohol or while driving or operating heavy machinery.  May cause drowsiness. 02/24/22  Yes Eulogio Bear, NP  PROGESTERONE PO Take by mouth.   Yes [provider]  hydrochlorothiazide (HYDRODIURIL) 12.5 MG tablet Take 12.5 mg by mouth daily. 05/09/20   [provider]  lisinopril (ZESTRIL) 10 MG tablet Take 1 tablet by mouth daily. 06/24/20   [provider]  Multiple Vitamin (MULTIVITAMIN) capsule Take 1 capsule by mouth daily.    [provider]  Omega-3 Fatty Acids (FISH OIL) 1000 MG CAPS Take by mouth daily.    [provider]  OVER THE COUNTER MEDICATION Pure lvr formula BId  [provider]  OVER THE COUNTER MEDICATION Pure Nic Bid with meals    [provider]  OVER THE COUNTER MEDICATION D3 K2 once per day    [provider]  OVER THE COUNTER MEDICATION GI Detox one - twice daily    [provider]  OVER THE COUNTER MEDICATION Pro aller Bid    [provider]  OVER THE COUNTER MEDICATION GLA ( Gamma Linolenic Acid) Once daily    [provider]  OVER THE COUNTER MEDICATION Collagen Powder once daily    [provider]  OVER THE COUNTER MEDICATION Take 1 tablet by mouth daily. Nitric Acid    [provider]  Probiotic Product (PROBIOTIC  DAILY PO) Take 3 capsules by mouth daily.     [provider]  TURMERIC PO Take 2 tablets by mouth daily.    [provider]    Family History Family History  Problem Relation Age of Onset   Hypertension Mother 41   Hyperlipidemia Mother    Arthritis Mother    Hypertension Father 76   Heart disease Father        Pacemaker   Dementia Father    Heart disease Sister    Congestive Heart Failure Brother    Breast cancer Sister    Colon polyps Neg Hx    Colon cancer Neg Hx     Social History Social History   Tobacco Use   Smoking status: Never   Smokeless tobacco: Never  Vaping Use   Vaping Use: Never used  Substance Use Topics   Alcohol use: No   Drug use: No     Allergies   Beta adrenergic blockers, Levsin [hyoscyamine sulfate], Neurontin [gabapentin], Pravastatin, Sulfa antibiotics, Vicodin [hydrocodone-acetaminophen], Zocor [simvastatin], Ibuprofen, and Lidocaine   Review of Systems Review of Systems Per HPI  Physical Exam Triage Vital Signs ED Triage Vitals  Enc Vitals Group     BP 02/24/22 1404 (!) 162/68     Pulse Rate 02/24/22 1404 80     Resp 02/24/22 1404 16     Temp 02/24/22 1404 98.4 F (36.9 C)     Temp Source 02/24/22 1404 Oral     SpO2 02/24/22 1404 96 %     Weight --      Height --      Head Circumference --      Peak Flow --      Pain Score 02/24/22 1406 0     Pain Loc --      Pain Edu? --      Excl. in [place]? --    No data found.  Updated Vital Signs BP (!) 162/68 (BP Location: Right Arm)   Pulse 80   Temp 98.4 F (36.9 C) (Oral)   Resp 16   SpO2 96%   Visual Acuity Right Eye Distance:   Left Eye Distance:   Bilateral Distance:    Right Eye Near:   Left Eye Near:    Bilateral Near:     Physical Exam Vitals and nursing note reviewed.  Constitutional:      General: Tracie Brooks is not in acute distress.    Appearance: Normal appearance. Tracie Brooks is not ill-appearing or toxic-appearing.  HENT:     Head: Normocephalic  and atraumatic.     Right Ear: Tympanic membrane, ear canal and external ear normal.     Left Ear: Tympanic membrane, ear canal and external ear normal.     Nose: No congestion or  rhinorrhea.     Mouth/Throat:     Mouth: Mucous membranes are moist.     Pharynx: Oropharynx is clear. Posterior oropharyngeal erythema present. No oropharyngeal exudate.  Eyes:     General: No scleral icterus.    Extraocular Movements: Extraocular movements intact.  Cardiovascular:     Rate and Rhythm: Normal rate and regular rhythm.  Pulmonary:     Effort: Pulmonary effort is normal. No respiratory distress.     Breath sounds: Normal breath sounds. No wheezing, rhonchi or rales.  Abdominal:     General: Abdomen is flat. Bowel sounds are normal. There is no distension.     Palpations: Abdomen is soft.     Tenderness: There is no abdominal tenderness.  Musculoskeletal:     Cervical back: Normal range of motion and neck supple.  Lymphadenopathy:     Cervical: No cervical adenopathy.  Skin:    General: Skin is warm and dry.     Coloration: Skin is not jaundiced or pale.     Findings: No erythema or rash.  Neurological:     Mental Status: Tracie Brooks is alert and oriented to person, place, and time.  Psychiatric:        Behavior: Behavior is cooperative.      UC Treatments / Results  Labs (all labs ordered are listed, but only abnormal results are displayed) Labs Reviewed - No data to display  EKG   Radiology No results found.  Procedures Procedures (including critical care time)  Medications Ordered in UC Medications - No data to display  Initial Impression / Assessment and Plan / UC Course  I have reviewed the triage vital signs and the nursing notes.  Pertinent labs & imaging results that were available during my care of the patient were reviewed by me and considered in my medical decision making (see chart for details).   Patient is well-appearing, afebrile, not tachycardic, not tachypneic,  oxygenating well on room air.  Patient is mildly hypertensive today, likely secondary to OTC medication use.  Post-viral cough syndrome Suspect post viral cough Chest x-ray deferred at this time-no pleurisy, abnormal lung sounds, is oxygenating well and not tachypneic Start guaifenesin, other supportive care discussed Can start Tessalon perles for dry cough ER and return precautions discussed   The patient was given the opportunity to ask questions.  All questions answered to their satisfaction.  The patient is in agreement to this plan.   Final Clinical Impressions(s) / UC Diagnoses   Final diagnoses:  Post-viral cough syndrome     Discharge Instructions      I suspect you have a post viral cough.  This should improve over the next couple of weeks.    Some things that can make you feel better are: - Increased rest - Increasing fluid with water/sugar free electrolytes - Acetaminophen and ibuprofen as needed for fever/pain - Salt water gargling, chloraseptic spray and throat lozenges - OTC guaifenesin (Mucinex) 600 mg twice daily - Saline sinus flushes or a neti pot - Humidifying the air -Tessalon Perles as needed for dry cough     ED Prescriptions     Medication Sig Dispense Auth. Provider   benzonatate (TESSALON) 100 MG capsule Take 1 capsule (100 mg total) by mouth 3 (three) times daily as needed for cough. Do not take with alcohol or while driving or operating heavy machinery.  May cause drowsiness. 21 capsule Eulogio Bear, NP      PDMP not reviewed this encounter.  Eulogio Bear, NP 02/24/22 1453

## 2022-03-04 DIAGNOSIS — H524 Presbyopia: Secondary | ICD-10-CM | POA: Diagnosis not present

## 2022-03-04 DIAGNOSIS — H25813 Combined forms of age-related cataract, bilateral: Secondary | ICD-10-CM | POA: Diagnosis not present

## 2022-03-04 DIAGNOSIS — H02831 Dermatochalasis of right upper eyelid: Secondary | ICD-10-CM | POA: Diagnosis not present

## 2022-03-04 DIAGNOSIS — H43393 Other vitreous opacities, bilateral: Secondary | ICD-10-CM | POA: Diagnosis not present

## 2022-03-04 DIAGNOSIS — H04123 Dry eye syndrome of bilateral lacrimal glands: Secondary | ICD-10-CM | POA: Diagnosis not present

## 2022-03-05 ENCOUNTER — Ambulatory Visit (INDEPENDENT_AMBULATORY_CARE_PROVIDER_SITE_OTHER): Payer: Medicare HMO | Admitting: Internal Medicine

## 2022-03-05 ENCOUNTER — Encounter: Payer: Self-pay | Admitting: Internal Medicine

## 2022-03-05 VITALS — BP 110/71 | HR 77 | Temp 98.0°F | Ht 66.0 in | Wt 192.8 lb

## 2022-03-05 DIAGNOSIS — K5730 Diverticulosis of large intestine without perforation or abscess without bleeding: Secondary | ICD-10-CM

## 2022-03-05 DIAGNOSIS — K5904 Chronic idiopathic constipation: Secondary | ICD-10-CM | POA: Diagnosis not present

## 2022-03-05 DIAGNOSIS — R1012 Left upper quadrant pain: Secondary | ICD-10-CM | POA: Diagnosis not present

## 2022-03-05 DIAGNOSIS — K589 Irritable bowel syndrome without diarrhea: Secondary | ICD-10-CM | POA: Diagnosis not present

## 2022-03-05 NOTE — Progress Notes (Signed)
Primary Care Physician:  Celene Squibb, MD Primary Gastroenterologist:  Dr. Abbey Chatters  Chief Complaint  Patient presents with   Abdominal Pain    New patient. Referred by Dr. Nevada Crane. Was having some adominal spasms and pain on left side. Had CT scan and showed Extensive sigmoid diverticulosis. Patient has been working with a Engineer, maintenance and has changed diet and symptoms are doing better.      HPI:   Tracie Brooks is a 76 y.o. female who presents to the clinic today by referral from her PCP Dr. Nevada Crane for evaluation.  Patient has had issues with abdominal pain since 2022.  These have progressively become more frequent.  Will have "an attack" every 3 to 4 weeks.  Timing is sporadic.  Severity mild to severe.  The episodes will also last sometimes for short period time sometimes for hours.  Primarily left upper quadrant below her rib cage.  Underwent CT imaging which I personally reviewed 01/16/2022 which showed sigmoid diverticulosis, questionable mild wall thickening mild diverticulitis.  Last colonoscopy 2017 unremarkable besides sigmoid diverticulosis.  No family history of colorectal malignancy.  No melena hematochezia.  CT did show moderate stool burden.  Patient states she has a bowel movement every day 1-2 times.  Denies any straining though does report history of constipation in the past.  She sees a functional medicine doctor and states with dietary changes her abdominal issues have improved.  No upper GI symptoms including heartburn, reflux, dysphagia/odynophagia, epigastric or chest pain.   Past Medical History:  Diagnosis Date   Allergy    Asthma    Chest pain    Dyslipidemia    Hypertension    IBS (irritable bowel syndrome)    Murmur     Past Surgical History:  Procedure Laterality Date   ABDOMINAL HYSTERECTOMY     BIOPSY BREAST  1985   COLONOSCOPY     COLONOSCOPY N/A 09/03/2015   Procedure: COLONOSCOPY;  Surgeon: Danie Binder, MD;  [facility];   Service: Endoscopy;  Laterality: N/A;  1400   KNEE ARTHROSCOPY     right [place] Right 05/28/2018   Procedure: TOTAL HIP ARTHROPLASTY ANTERIOR APPROACH;  Surgeon: Renette Butters, MD;  Location: [facility];  Service: Orthopedics;  Laterality: Right;    Current Outpatient Medications  Medication Sig Dispense Refill   ARMOUR THYROID 30 MG tablet Take 30 mg by mouth daily.     hydrochlorothiazide (HYDRODIURIL) 12.5 MG tablet Take 12.5 mg by mouth daily.     lisinopril (ZESTRIL) 10 MG tablet Take 1 tablet by mouth daily.     OVER THE COUNTER MEDICATION Pure NAC Bid with meals     OVER THE COUNTER MEDICATION D3 K2 once per day     OVER THE COUNTER MEDICATION GI Detox one - once daily     OVER THE COUNTER MEDICATION Collagen Powder once daily     PROGESTERONE PO Take by mouth.     TURMERIC PO Take 2 tablets by mouth daily.     No current facility-administered medications for this visit.    Allergies as of 03/05/2022 - Review Complete 03/05/2022  Allergen Reaction Noted   Beta adrenergic blockers Swelling 06/19/2012   Levsin [hyoscyamine sulfate] Other (See Comments) 06/24/2012   Neurontin [gabapentin] Other (See Comments) 02/25/2017   Pravastatin Other (See Comments) 06/24/2012   Sulfa antibiotics Other (See Comments) 07/15/2011   Vicodin [hydrocodone-acetaminophen] Other (See Comments) 10/17/2016   Zocor [  simvastatin] Other (See Comments) 06/24/2012   Ibuprofen Rash 05/09/2015   Lidocaine Rash 05/09/2015    Family History  Problem Relation Age of Onset   Hypertension Mother 20   Hyperlipidemia Mother    Arthritis Mother    Hypertension Father 44   Heart disease Father        Pacemaker   Dementia Father    Heart disease Sister    Congestive Heart Failure Brother    Breast cancer Sister    Colon polyps Neg Hx    Colon cancer Neg Hx     Social History   Socioeconomic History   Marital status: Married    Spouse name: Christean Grief   Number of children: 1    Years of education: Not on file   Highest education level: Master's degree (e.g., MA, MS, MEng, MEd, MSW, MBA)  Occupational History   Occupation: Controller/HR  Tobacco Use   Smoking status: Never   Smokeless tobacco: Never  Vaping Use   Vaping Use: Never used  Substance and Sexual Activity   Alcohol use: No   Drug use: No   Sexual activity: Not on file  Other Topics Concern   Not on file  Social History Narrative   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   No regular exercise      Patient is right-handed. She lives with her husband in a 2 level home. She walks most days.   Social Determinants of Health   Financial Resource Strain: Not on file  Food Insecurity: Not on file  Transportation Needs: Not on file  Physical Activity: Not on file  Stress: Not on file  Social Connections: Not on file  Intimate Partner Violence: Not on file    Subjective: Review of Systems  Constitutional:  Negative for chills and fever.  HENT:  Negative for congestion and hearing loss.   Eyes:  Negative for blurred vision and double vision.  Respiratory:  Negative for cough and shortness of breath.   Cardiovascular:  Negative for chest pain and palpitations.  Gastrointestinal:  Positive for abdominal pain. Negative for blood in stool, constipation, diarrhea, heartburn, melena and vomiting.  Genitourinary:  Negative for dysuria and urgency.  Musculoskeletal:  Negative for joint pain and myalgias.  Skin:  Negative for itching and rash.  Neurological:  Negative for dizziness and headaches.  Psychiatric/Behavioral:  Negative for depression. The patient is not nervous/anxious.        Objective: There were no vitals taken for this visit. Physical Exam Constitutional:      Appearance: Normal appearance.  HENT:     Head: Normocephalic and atraumatic.  Eyes:     Extraocular Movements: Extraocular movements intact.     Conjunctiva/sclera: Conjunctivae normal.  Cardiovascular:      Rate and Rhythm: Normal rate and regular rhythm.  Pulmonary:     Effort: Pulmonary effort is normal.     Breath sounds: Normal breath sounds.  Abdominal:     General: Bowel sounds are normal.     Palpations: Abdomen is soft.  Musculoskeletal:        General: No swelling. Normal range of motion.     Cervical back: Normal range of motion and neck supple.  Skin:    General: Skin is warm and dry.     Coloration: Skin is not jaundiced.  Neurological:     General: No focal deficit present.     Mental Status: She is alert and oriented to person, place, and time.  Psychiatric:  Mood and Affect: Mood normal.        Behavior: Behavior normal.      Assessment: *LUQ abdominal pain *Colon spasms *Sigmoid diverticulosis *Chronic idiopathic constipation  Plan: Discussed patient's symptoms in depth with her today.  Also reviewed CT scan with her.  Patient's symptoms improved with changes in her diet.  Discussed diverticulosis in depth.  Recommend adding over-the-counter Benefiber 1-2 times daily.  Her symptoms are not classic for diverticulitis.  Ensure that she is drinking at least 6 glasses of water daily.  Can consider laxatives pending clinical course given her stool burden on CT scan.  Colonoscopy recall 2027 for colon cancer screening purposes if benefits outweigh risks.  Follow-up with GI in 4 months.  Thank you Dr. Nevada Crane for the kind referral  03/05/2022 8:57 AM   Disclaimer: This note was dictated with voice recognition software. Similar sounding words can inadvertently be transcribed and may not be corrected upon review.

## 2022-03-05 NOTE — Patient Instructions (Signed)
I am happy to hear that you are feeling better.  Would recommend adding once daily over-the-counter Benefiber 1-2 times daily to your regimen.  Ensure that you are drinking at least 6 glasses of water daily.  Follow-up with GI in 4 months.  It was very nice meeting you today.  Dr. Abbey Chatters

## 2022-03-06 DIAGNOSIS — E7841 Elevated Lipoprotein(a): Secondary | ICD-10-CM | POA: Diagnosis not present

## 2022-03-06 DIAGNOSIS — R946 Abnormal results of thyroid function studies: Secondary | ICD-10-CM | POA: Diagnosis not present

## 2022-03-06 DIAGNOSIS — D6489 Other specified anemias: Secondary | ICD-10-CM | POA: Diagnosis not present

## 2022-03-06 DIAGNOSIS — R799 Abnormal finding of blood chemistry, unspecified: Secondary | ICD-10-CM | POA: Diagnosis not present

## 2022-03-06 DIAGNOSIS — E559 Vitamin D deficiency, unspecified: Secondary | ICD-10-CM | POA: Diagnosis not present

## 2022-03-06 DIAGNOSIS — R947 Abnormal results of other endocrine function studies: Secondary | ICD-10-CM | POA: Diagnosis not present

## 2022-03-06 DIAGNOSIS — Z7689 Persons encountering health services in other specified circumstances: Secondary | ICD-10-CM | POA: Diagnosis not present

## 2022-03-06 DIAGNOSIS — R7301 Impaired fasting glucose: Secondary | ICD-10-CM | POA: Diagnosis not present

## 2022-03-06 DIAGNOSIS — E782 Mixed hyperlipidemia: Secondary | ICD-10-CM | POA: Diagnosis not present

## 2022-03-06 DIAGNOSIS — R5383 Other fatigue: Secondary | ICD-10-CM | POA: Diagnosis not present

## 2022-03-06 DIAGNOSIS — E063 Autoimmune thyroiditis: Secondary | ICD-10-CM | POA: Diagnosis not present

## 2022-03-06 DIAGNOSIS — E039 Hypothyroidism, unspecified: Secondary | ICD-10-CM | POA: Diagnosis not present

## 2022-05-16 DIAGNOSIS — R946 Abnormal results of thyroid function studies: Secondary | ICD-10-CM | POA: Diagnosis not present

## 2022-05-16 DIAGNOSIS — R947 Abnormal results of other endocrine function studies: Secondary | ICD-10-CM | POA: Diagnosis not present

## 2022-05-16 DIAGNOSIS — R7301 Impaired fasting glucose: Secondary | ICD-10-CM | POA: Diagnosis not present

## 2022-05-16 DIAGNOSIS — Z7689 Persons encountering health services in other specified circumstances: Secondary | ICD-10-CM | POA: Diagnosis not present

## 2022-05-16 DIAGNOSIS — E039 Hypothyroidism, unspecified: Secondary | ICD-10-CM | POA: Diagnosis not present

## 2022-05-16 DIAGNOSIS — R799 Abnormal finding of blood chemistry, unspecified: Secondary | ICD-10-CM | POA: Diagnosis not present

## 2022-05-16 DIAGNOSIS — R5383 Other fatigue: Secondary | ICD-10-CM | POA: Diagnosis not present

## 2022-05-16 DIAGNOSIS — E063 Autoimmune thyroiditis: Secondary | ICD-10-CM | POA: Diagnosis not present

## 2022-05-16 DIAGNOSIS — E782 Mixed hyperlipidemia: Secondary | ICD-10-CM | POA: Diagnosis not present

## 2022-05-16 DIAGNOSIS — E559 Vitamin D deficiency, unspecified: Secondary | ICD-10-CM | POA: Diagnosis not present

## 2022-05-16 DIAGNOSIS — D6489 Other specified anemias: Secondary | ICD-10-CM | POA: Diagnosis not present

## 2022-05-16 DIAGNOSIS — D8940 Mast cell activation, unspecified: Secondary | ICD-10-CM | POA: Diagnosis not present

## 2022-07-03 NOTE — Progress Notes (Deleted)
GI Office Note    Referring Provider: Celene Squibb, MD Primary Care Physician:  Celene Squibb, MD  Primary Gastroenterologist: Elon Alas. Abbey Chatters, DO   Chief Complaint   No chief complaint on file.   History of Present Illness   Tracie Brooks is a 77 y.o. female presenting today for follow up. Last seen 02/2022 for abdominal pain.   Described episodic abdominal pain since 2022 with more frequent attacks. Occurring every 3-4 weeks. Mild to severe. Primarily left sided abdominal pain, below ribs. CT 12/2021, showed sigmoid diverticulosis, moderate stool burden. ***?mild wall thickening of sigmoid colon.   Sees functional doctor and noted dietary changes have helped her abdominal issues.   Last colonoscopy 2017, sigmoid diverticulosis. No FH of CRC. Next colonoscopy 2027.        Medications   Current Outpatient Medications  Medication Sig Dispense Refill   ARMOUR THYROID 30 MG tablet Take 30 mg by mouth daily.     b complex vitamins capsule Take 1 capsule by mouth daily.     hydrochlorothiazide (HYDRODIURIL) 12.5 MG tablet Take 12.5 mg by mouth daily.     lisinopril (ZESTRIL) 10 MG tablet Take 1 tablet by mouth daily.     OVER THE COUNTER MEDICATION Pure NAC Bid with meals     OVER THE COUNTER MEDICATION D3 K2 once per day     OVER THE COUNTER MEDICATION GI Detox one - once daily     OVER THE COUNTER MEDICATION Collagen Powder once daily     OVER THE COUNTER MEDICATION Liver capsules 3 capsules daily     PROGESTERONE PO Take by mouth.     No current facility-administered medications for this visit.    Allergies   Allergies as of 07/04/2022 - Review Complete 03/05/2022  Allergen Reaction Noted   Beta adrenergic blockers Swelling 06/19/2012   Levsin [hyoscyamine sulfate] Other (See Comments) 06/24/2012   Neurontin [gabapentin] Other (See Comments) 02/25/2017   Pravastatin Other (See Comments) 06/24/2012   Sulfa antibiotics Other (See Comments) 07/15/2011    Vicodin [hydrocodone-acetaminophen] Other (See Comments) 10/17/2016   Zocor [simvastatin] Other (See Comments) 06/24/2012   Ibuprofen Rash 05/09/2015   Lidocaine Rash 05/09/2015     Past Medical History   Past Medical History:  Diagnosis Date   Allergy    Asthma    Chest pain    Dyslipidemia    Hypertension    IBS (irritable bowel syndrome)    Murmur     Past Surgical History   Past Surgical History:  Procedure Laterality Date   ABDOMINAL HYSTERECTOMY     BIOPSY BREAST  1985   COLONOSCOPY     COLONOSCOPY N/A 09/03/2015   Procedure: COLONOSCOPY;  Surgeon: Danie Binder, MD;  [facility];  Service: Endoscopy;  Laterality: N/A;  1400   KNEE ARTHROSCOPY     right [place] Right 05/28/2018   Procedure: TOTAL HIP ARTHROPLASTY ANTERIOR APPROACH;  Surgeon: Renette Butters, MD;  Location: [facility];  Service: Orthopedics;  Laterality: Right;    Past Family History   Family History  Problem Relation Age of Onset   Hypertension Mother 69   Hyperlipidemia Mother    Arthritis Mother    Hypertension Father 22   Heart disease Father        Pacemaker   Dementia Father    Heart disease Sister    Congestive Heart Failure Brother    Breast cancer Sister  Colon polyps Neg Hx    Colon cancer Neg Hx     Past Social History   Social History   Socioeconomic History   Marital status: Married    Spouse name: Edwin   Number of children: 1   Years of education: Not on file   Highest education level: Master's degree (e.g., MA, MS, MEng, MEd, MSW, MBA)  Occupational History   Occupation: Controller/HR  Tobacco Use   Smoking status: Never    Passive exposure: Past   Smokeless tobacco: Never  Vaping Use   Vaping Use: Never used  Substance and Sexual Activity   Alcohol use: No   Drug use: No   Sexual activity: Not on file  Other Topics Concern   Not on file  Social History Narrative   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   No  regular exercise      Patient is right-handed. She lives with her husband in a 2 level home. She walks most days.   Social Determinants of Health   Financial Resource Strain: Not on file  Food Insecurity: Not on file  Transportation Needs: Not on file  Physical Activity: Not on file  Stress: Not on file  Social Connections: Not on file  Intimate Partner Violence: Not on file    Review of Systems   General: Negative for anorexia, weight loss, fever, chills, fatigue, weakness. ENT: Negative for hoarseness, difficulty swallowing , nasal congestion. CV: Negative for chest pain, angina, palpitations, dyspnea on exertion, peripheral edema.  Respiratory: Negative for dyspnea at rest, dyspnea on exertion, cough, sputum, wheezing.  GI: See history of present illness. GU:  Negative for dysuria, hematuria, urinary incontinence, urinary frequency, nocturnal urination.  Endo: Negative for unusual weight change.     Physical Exam   There were no vitals taken for this visit.   General: Well-nourished, well-developed in no acute distress.  Eyes: No icterus. Mouth: Oropharyngeal mucosa moist and pink , no lesions erythema or exudate. Lungs: Clear to auscultation bilaterally.  Heart: Regular rate and rhythm, no murmurs rubs or gallops.  Abdomen: Bowel sounds are normal, nontender, nondistended, no hepatosplenomegaly or masses,  no abdominal bruits or hernia , no rebound or guarding.  Rectal: ***  Extremities: No lower extremity edema. No clubbing or deformities. Neuro: Alert and oriented x 4   Skin: Warm and dry, no jaundice.   Psych: Alert and cooperative, normal mood and affect.  Labs   *** Imaging Studies   No results found.  Assessment       PLAN   ***   Laureen Ochs. Bobby Rumpf, [place], [facility]

## 2022-07-04 ENCOUNTER — Ambulatory Visit: Payer: Medicare HMO | Admitting: Gastroenterology

## 2022-07-04 ENCOUNTER — Encounter: Payer: Self-pay | Admitting: Gastroenterology

## 2022-07-14 ENCOUNTER — Ambulatory Visit: Payer: Medicare HMO | Admitting: Gastroenterology

## 2022-07-14 ENCOUNTER — Encounter: Payer: Self-pay | Admitting: Gastroenterology

## 2022-07-14 VITALS — BP 122/68 | HR 75 | Temp 97.6°F | Ht 66.0 in | Wt 194.0 lb

## 2022-07-14 DIAGNOSIS — R1012 Left upper quadrant pain: Secondary | ICD-10-CM | POA: Diagnosis not present

## 2022-07-14 NOTE — Patient Instructions (Signed)
Continue to monitor for change in stools, worsening abdominal pain, blood in the stool. If you note any of these, please call and let us know. Otherwise, we will see you back as needed and consider colonoscopy in 2027.

## 2022-07-14 NOTE — Progress Notes (Signed)
GI Office Note    Referring Provider: Benita Stabile, MD Primary Care Physician:  Benita Stabile, MD  Primary Gastroenterologist: Hennie Duos. Marletta Lor, DO   Chief Complaint   Chief Complaint  Patient presents with   Follow-up    Doing well, no issues    History of Present Illness   IEESHA BOSTOCK is a 77 y.o. female presenting today for follow of abdominal pain. Last seen in 02/2022. She has h/o sigmoid diverticulosis, left upper quadrant pain.  Patient with history of sporadic but progressive abdominal pain since 2022, attacks occurring every 3 to 4 weeks.  Can last for short periods of time but up to 8 hours.  Primary in the left upper quadrant below the rib cage. Feels like a spasms. Has been triggered by movement, stress, and sometimes food. Has been seeing functional medicine doctor since 2019 (Dr. Ferdinand Cava). Over the years, she has significantly changed her diet to help with several medical issues. She has many food sensitivities, avoids gluten, dairy free, processed foods, and refined sugars. She has been tested for allergies, she denies food allergies but has many environmental allergies.  She denies heartburn, dysphagia. Did not start benefiber due to high fiber diet. Having BM 3 per day.  8 cups of liquid (morning coffee decaffeinated, herbal tea, water). No blood in the stool. Since last ov, she has had luq pain 1-2 times but very mild. Usually if she notes onset of symptoms and can stop and relax, she can stop progression of episode.    Last colonoscopy in 2017 with sigmoid diverticulosis. Next colonoscopy due in 2027.  CT A/P with contrast 12/2021: IMPRESSION: -Extensive sigmoid diverticulosis. Question sigmoid wall thickening which could be seen with mild diverticulitis. 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   Medications   Current Outpatient Medications  Medication Sig Dispense Refill   ARMOUR THYROID 30 MG tablet Take  30 mg by mouth daily.     b complex vitamins capsule Take 1 capsule by mouth daily.     hydrochlorothiazide (HYDRODIURIL) 12.5 MG tablet Take 12.5 mg by mouth daily.     lisinopril (ZESTRIL) 10 MG tablet Take 1 tablet by mouth daily.     OVER THE COUNTER MEDICATION Pure NAC Bid with meals     OVER THE COUNTER MEDICATION D3 K2 once per day     OVER THE COUNTER MEDICATION Collagen Powder once daily     PROGESTERONE PO Take by mouth.     No current facility-administered medications for this visit.    Allergies   Allergies as of 07/14/2022 - Review Complete 07/14/2022  Allergen Reaction Noted   Beta adrenergic blockers Swelling 06/19/2012   Levsin [hyoscyamine sulfate] Other (See Comments) 06/24/2012   Neurontin [gabapentin] Other (See Comments) 02/25/2017   Pravastatin Other (See Comments) 06/24/2012   Sulfa antibiotics Other (See Comments) 07/15/2011   Vicodin [hydrocodone-acetaminophen] Other (See Comments) 10/17/2016   Zocor [simvastatin] Other (See Comments) 06/24/2012   Ibuprofen Rash 05/09/2015   Lidocaine Rash 05/09/2015         Review of Systems   General: Negative for anorexia, weight loss, fever, chills, fatigue, weakness. ENT: Negative for hoarseness, difficulty swallowing , nasal congestion. CV: Negative for chest pain, angina, palpitations, dyspnea on exertion, peripheral edema.  Respiratory: Negative for dyspnea at rest, dyspnea on exertion, cough, sputum, wheezing.  GI: See history of present illness. GU:  Negative for dysuria, hematuria, urinary incontinence, urinary frequency,  nocturnal urination.  Endo: Negative for unusual weight change.     Physical Exam   BP 122/68 (BP Location: Right Arm, Patient Position: Sitting, Cuff Size: Large)   Pulse 75   Temp 97.6 F (36.4 C) (Oral)   Ht  (1.676 m)   Wt 194 lb (88 kg)   SpO2 97%   BMI 31.31 kg/m    General: Well-nourished, well-developed in no acute distress.  Eyes: No icterus. Mouth: Oropharyngeal  mucosa moist and pink   Lungs: Clear to auscultation bilaterally.  Heart: Regular rate and rhythm, no murmurs rubs or gallops.  Abdomen: Bowel sounds are normal, nontender, nondistended, no hepatosplenomegaly or masses,  no abdominal bruits or hernia , no rebound or guarding.  Rectal: not performed Extremities: No lower extremity edema. No clubbing or deformities. Neuro: Alert and oriented x 4   Skin: Warm and dry, no jaundice.   Psych: Alert and cooperative, normal mood and affect.  Labs   05/2022: LFTs normal  Imaging Studies   No results found.  Assessment   LUQ pain: less frequent than before. Likely unrelated to diverticulosis/diverticulitis. There was question of sigmoid wall thickening on CT. Dr. Marletta Lor recommends next colonoscopy in 2027 (10 year follow up). Clinically patient doing well at this time. We discussed watching for change in symptoms, change in stools, blood in stool. Any of these would warrant updating colonoscopy.   PLAN   Consider colonoscopy in 2027 for colon cancer screening purposes benefits outweigh the risk.   Leanna Battles. Melvyn Neth, MHS, PA-C [facility]

## 2022-08-01 DIAGNOSIS — D8940 Mast cell activation, unspecified: Secondary | ICD-10-CM | POA: Diagnosis not present

## 2022-08-01 DIAGNOSIS — E039 Hypothyroidism, unspecified: Secondary | ICD-10-CM | POA: Diagnosis not present

## 2022-08-01 DIAGNOSIS — D869 Sarcoidosis, unspecified: Secondary | ICD-10-CM | POA: Diagnosis not present

## 2022-08-01 DIAGNOSIS — E559 Vitamin D deficiency, unspecified: Secondary | ICD-10-CM | POA: Diagnosis not present

## 2022-08-01 DIAGNOSIS — E063 Autoimmune thyroiditis: Secondary | ICD-10-CM | POA: Diagnosis not present

## 2022-08-07 DIAGNOSIS — R947 Abnormal results of other endocrine function studies: Secondary | ICD-10-CM | POA: Diagnosis not present

## 2022-08-07 DIAGNOSIS — D6489 Other specified anemias: Secondary | ICD-10-CM | POA: Diagnosis not present

## 2022-08-07 DIAGNOSIS — E063 Autoimmune thyroiditis: Secondary | ICD-10-CM | POA: Diagnosis not present

## 2022-08-07 DIAGNOSIS — Z7689 Persons encountering health services in other specified circumstances: Secondary | ICD-10-CM | POA: Diagnosis not present

## 2022-08-07 DIAGNOSIS — E782 Mixed hyperlipidemia: Secondary | ICD-10-CM | POA: Diagnosis not present

## 2022-08-07 DIAGNOSIS — E559 Vitamin D deficiency, unspecified: Secondary | ICD-10-CM | POA: Diagnosis not present

## 2022-08-07 DIAGNOSIS — E7841 Elevated Lipoprotein(a): Secondary | ICD-10-CM | POA: Diagnosis not present

## 2022-08-07 DIAGNOSIS — R7301 Impaired fasting glucose: Secondary | ICD-10-CM | POA: Diagnosis not present

## 2022-08-07 DIAGNOSIS — R946 Abnormal results of thyroid function studies: Secondary | ICD-10-CM | POA: Diagnosis not present

## 2022-08-07 DIAGNOSIS — E039 Hypothyroidism, unspecified: Secondary | ICD-10-CM | POA: Diagnosis not present

## 2022-08-07 DIAGNOSIS — R799 Abnormal finding of blood chemistry, unspecified: Secondary | ICD-10-CM | POA: Diagnosis not present

## 2022-08-07 DIAGNOSIS — R5383 Other fatigue: Secondary | ICD-10-CM | POA: Diagnosis not present

## 2022-09-15 DIAGNOSIS — E0590 Thyrotoxicosis, unspecified without thyrotoxic crisis or storm: Secondary | ICD-10-CM | POA: Diagnosis not present

## 2022-09-15 DIAGNOSIS — I10 Essential (primary) hypertension: Secondary | ICD-10-CM | POA: Diagnosis not present

## 2022-09-15 DIAGNOSIS — E785 Hyperlipidemia, unspecified: Secondary | ICD-10-CM | POA: Diagnosis not present

## 2022-09-19 DIAGNOSIS — R947 Abnormal results of other endocrine function studies: Secondary | ICD-10-CM | POA: Diagnosis not present

## 2022-09-19 DIAGNOSIS — E063 Autoimmune thyroiditis: Secondary | ICD-10-CM | POA: Diagnosis not present

## 2022-09-19 DIAGNOSIS — E039 Hypothyroidism, unspecified: Secondary | ICD-10-CM | POA: Diagnosis not present

## 2022-09-19 DIAGNOSIS — I10 Essential (primary) hypertension: Secondary | ICD-10-CM | POA: Diagnosis not present

## 2022-09-19 DIAGNOSIS — R635 Abnormal weight gain: Secondary | ICD-10-CM | POA: Diagnosis not present

## 2022-09-22 DIAGNOSIS — K5730 Diverticulosis of large intestine without perforation or abscess without bleeding: Secondary | ICD-10-CM | POA: Diagnosis not present

## 2022-09-22 DIAGNOSIS — I129 Hypertensive chronic kidney disease with stage 1 through stage 4 chronic kidney disease, or unspecified chronic kidney disease: Secondary | ICD-10-CM | POA: Diagnosis not present

## 2022-09-22 DIAGNOSIS — R109 Unspecified abdominal pain: Secondary | ICD-10-CM | POA: Diagnosis not present

## 2022-09-22 DIAGNOSIS — D1771 Benign lipomatous neoplasm of kidney: Secondary | ICD-10-CM | POA: Diagnosis not present

## 2022-09-22 DIAGNOSIS — I700 Atherosclerosis of aorta: Secondary | ICD-10-CM | POA: Diagnosis not present

## 2022-09-22 DIAGNOSIS — E785 Hyperlipidemia, unspecified: Secondary | ICD-10-CM | POA: Diagnosis not present

## 2022-09-22 DIAGNOSIS — I10 Essential (primary) hypertension: Secondary | ICD-10-CM | POA: Diagnosis not present

## 2022-09-22 DIAGNOSIS — N1832 Chronic kidney disease, stage 3b: Secondary | ICD-10-CM | POA: Diagnosis not present

## 2022-09-22 DIAGNOSIS — E039 Hypothyroidism, unspecified: Secondary | ICD-10-CM | POA: Diagnosis not present

## 2022-10-25 ENCOUNTER — Ambulatory Visit
Admission: EM | Admit: 2022-10-25 | Discharge: 2022-10-25 | Disposition: A | Discharge status: Home / Self Care | Payer: Medicare HMO | Attending: Internal Medicine | Admitting: Internal Medicine

## 2022-10-25 ENCOUNTER — Ambulatory Visit: Payer: Medicare HMO

## 2022-10-25 DIAGNOSIS — S62115A Nondisplaced fracture of triquetrum [cuneiform] bone, left wrist, initial encounter for closed fracture: Secondary | ICD-10-CM | POA: Diagnosis not present

## 2022-10-25 DIAGNOSIS — S52122A Displaced fracture of head of left radius, initial encounter for closed fracture: Secondary | ICD-10-CM | POA: Diagnosis not present

## 2022-10-25 DIAGNOSIS — M19032 Primary osteoarthritis, left wrist: Secondary | ICD-10-CM | POA: Diagnosis not present

## 2022-10-25 DIAGNOSIS — M8580 Other specified disorders of bone density and structure, unspecified site: Secondary | ICD-10-CM | POA: Diagnosis not present

## 2022-10-25 NOTE — ED Triage Notes (Addendum)
Patient states she has left wrist pain from a fall that occurred yesterday. There is swelling to the area.   Home interventions: tylenol

## 2022-10-25 NOTE — Discharge Instructions (Signed)
We have placed you in a splint to immobilize the affected area.  Keep this on until you are able to follow-up with orthopedics next week for further recommendations.  Elevate the area at rest to help with swelling.  Take Tylenol as needed for pain, you may take up to 1000 mg 4 times daily as needed.

## 2022-10-25 NOTE — ED Provider Notes (Signed)
[facility]    CSN: [id number] Arrival date & time: 10/25/22  4098      History   Chief Complaint Chief Complaint  Patient presents with   Fall   Wrist Pain    HPI Tracie Brooks is a 77 y.o. female.   Patient presenting today with left wrist pain and swelling that occurred after a fall yesterday when she landed on her left hand.  Does have some movement in the fingers but very painful to do so.  Denies numbness, tingling, discoloration, head injury or other injuries from the fall.  So far trying Tylenol with mild temporary benefit.    Past Medical History:  Diagnosis Date   Allergy    Asthma    Chest pain    Dyslipidemia    Hypertension    IBS (irritable bowel syndrome)    Murmur     Patient Active Problem List   Diagnosis Date Noted   LUQ pain 07/14/2022   Closed right hip fracture, initial encounter (HCC) 05/27/2018   Osteopenia 12/28/2015   Hypercalcemia 09/04/2015   Colon cancer screening 08/29/2015   Arthritis 06/24/2012   Hemiparesis affecting dominant side as late effect of cerebrovascular accident (HCC) 07/15/2011   DYSLIPIDEMIA 12/05/2008   Blood pressure elevated without history of HTN 12/05/2008   Asthma 12/05/2008   CHEST PAIN 12/05/2008    Past Surgical History:  Procedure Laterality Date   ABDOMINAL HYSTERECTOMY     BIOPSY BREAST  1985   COLONOSCOPY     COLONOSCOPY N/A 09/03/2015   Procedure: COLONOSCOPY;  Surgeon: West Bali, MD;  [facility];  Service: Endoscopy;  Laterality: N/A;  1400   KNEE ARTHROSCOPY     right 1982   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      Home Medications    Prior to Admission medications   Medication Sig Start Date End Date Taking? Authorizing Provider  ARMOUR THYROID 30 MG tablet Take  30 mg by mouth daily. 10/31/21   [provider]  b complex vitamins capsule Take 1 capsule by mouth daily.    [provider]  hydrochlorothiazide (HYDRODIURIL) 12.5 MG tablet Take 12.5 mg by mouth daily. 05/09/20   [provider]  lisinopril (ZESTRIL) 10 MG tablet Take 1 tablet by mouth daily. 06/24/20   [provider]  OVER THE COUNTER MEDICATION Pure NAC Bid with meals    [provider]  OVER THE COUNTER MEDICATION D3 K2 once per day    [provider]  OVER THE COUNTER MEDICATION Collagen Powder once daily    [provider]  PROGESTERONE PO Take by mouth.    [provider]    Family History Family History  Problem Relation Age of Onset   Hypertension Mother 29   Hyperlipidemia Mother    Arthritis Mother    Hypertension Father 76   Heart disease Father        Pacemaker   Dementia Father    Heart disease Sister    Congestive Heart Failure Brother    Breast cancer Sister    Colon polyps Neg Hx    Colon cancer Neg Hx     Social History Social History   Tobacco Use   Smoking status: Never  Passive exposure: Past   Smokeless tobacco: Never  Vaping Use   Vaping status: Never Used  Substance Use Topics   Alcohol use: No   Drug use: No     Allergies   Beta adrenergic blockers, Levsin [hyoscyamine sulfate], Neurontin [gabapentin], Pravastatin, Sulfa antibiotics, Vicodin [hydrocodone-acetaminophen], Zocor [simvastatin], Ibuprofen, and Lidocaine   Review of Systems Review of Systems PER HPI  Physical Exam Triage Vital Signs ED Triage Vitals  Encounter Vitals Group     BP 10/25/22 0915 128/77     Systolic BP Percentile --      Diastolic BP Percentile --      Pulse Rate 10/25/22 0915 68     Resp 10/25/22 0915 18     Temp 10/25/22 0915 97.6 F (36.4 C)     Temp Source 10/25/22 0915 Oral     SpO2 10/25/22 0915 96 %     Weight --      Height --      Head Circumference --      Peak Flow --       Pain Score 10/25/22 0914 8     Pain Loc --      Pain Education --      Exclude from Growth Chart --    No data found.  Updated Vital Signs BP 128/77 (BP Location: Right Arm)   Pulse 68   Temp 97.6 F (36.4 C) (Oral)   Resp 18   SpO2 96%   Visual Acuity Right Eye Distance:   Left Eye Distance:   Bilateral Distance:    Right Eye Near:   Left Eye Near:    Bilateral Near:     Physical Exam Vitals and nursing note reviewed.  Constitutional:      Appearance: Normal appearance. She is not ill-appearing.  HENT:     Head: Atraumatic.  Eyes:     Extraocular Movements: Extraocular movements intact.     Conjunctiva/sclera: Conjunctivae normal.  Cardiovascular:     Rate and Rhythm: Normal rate and regular rhythm.     Heart sounds: Normal heart sounds.  Pulmonary:     Effort: Pulmonary effort is normal.     Breath sounds: Normal breath sounds.  Musculoskeletal:        General: Swelling, tenderness and signs of injury present. No deformity.     Cervical back: Normal range of motion and neck supple.     Comments: Limited range of motion to the left wrist due to pain and swelling associated with injury.  Diffuse tenderness, edema noted to the left wrist.  No deformity palpable  Skin:    General: Skin is warm and dry.  Neurological:     Mental Status: She is alert and oriented to person, place, and time.     Comments: Left upper extremity neurovascularly intact  Psychiatric:        Mood and Affect: Mood normal.        Thought Content: Thought content normal.        Judgment: Judgment normal.      UC Treatments / Results  Labs (all labs ordered are listed, but only abnormal results are displayed) Labs Reviewed - No data to display  EKG   Radiology DG Wrist Complete Left  Result Date: 10/25/2022 CLINICAL DATA:  fall, left wrist swelling/ pain EXAM: LEFT WRIST - COMPLETE 3+ VIEW COMPARISON:  None Available. FINDINGS: Osteopenia. Curvilinear ossific fleck along the  dorsal aspect of the carpal bones consistent with a triquetral fracture. Sclerotic  focus of the capitate, likely a bone island. Degenerative changes of the first CMC. Scattered subcortical cysts throughout the carpal bones. No unexpected radiopaque foreign body. Soft tissue edema. IMPRESSION: Acute triquetral fracture. Electronically Signed   By: Meda Klinefelter M.D.   On: 10/25/2022 09:36    Procedures Procedures (including critical care time)  Medications Ordered in UC Medications - No data to display  Initial Impression / Assessment and Plan / UC Course  I have reviewed the triage vital signs and the nursing notes.  Pertinent labs & imaging results that were available during my care of the patient were reviewed by me and considered in my medical decision making (see chart for details).     X-ray of the left wrist today showing a triquetral fracture.  Ulnar gutter splint placed, discussed RICE protocol, over-the-counter pain relievers and Ortho follow-up.  Return for worsening symptoms.  Final Clinical Impressions(s) / UC Diagnoses   Final diagnoses:  Closed nondisplaced fracture of triquetrum of left wrist, initial encounter     Discharge Instructions      We have placed you in a splint to immobilize the affected area.  Keep this on until you are able to follow-up with orthopedics next week for further recommendations.  Elevate the area at rest to help with swelling.  Take Tylenol as needed for pain, you may take up to 1000 mg 4 times daily as needed.    ED Prescriptions   None    PDMP not reviewed this encounter.   Particia Nearing, [place] 10/25/22 1026

## 2022-10-29 DIAGNOSIS — S52502A Unspecified fracture of the lower end of left radius, initial encounter for closed fracture: Secondary | ICD-10-CM | POA: Diagnosis not present

## 2022-11-05 DIAGNOSIS — S52502A Unspecified fracture of the lower end of left radius, initial encounter for closed fracture: Secondary | ICD-10-CM | POA: Diagnosis not present

## 2022-11-07 DIAGNOSIS — E063 Autoimmune thyroiditis: Secondary | ICD-10-CM | POA: Diagnosis not present

## 2022-11-07 DIAGNOSIS — R947 Abnormal results of other endocrine function studies: Secondary | ICD-10-CM | POA: Diagnosis not present

## 2022-11-07 DIAGNOSIS — Z7689 Persons encountering health services in other specified circumstances: Secondary | ICD-10-CM | POA: Diagnosis not present

## 2022-11-07 DIAGNOSIS — E782 Mixed hyperlipidemia: Secondary | ICD-10-CM | POA: Diagnosis not present

## 2022-11-07 DIAGNOSIS — R799 Abnormal finding of blood chemistry, unspecified: Secondary | ICD-10-CM | POA: Diagnosis not present

## 2022-11-07 DIAGNOSIS — R7301 Impaired fasting glucose: Secondary | ICD-10-CM | POA: Diagnosis not present

## 2022-11-07 DIAGNOSIS — D6489 Other specified anemias: Secondary | ICD-10-CM | POA: Diagnosis not present

## 2022-11-07 DIAGNOSIS — R946 Abnormal results of thyroid function studies: Secondary | ICD-10-CM | POA: Diagnosis not present

## 2022-11-07 DIAGNOSIS — R5383 Other fatigue: Secondary | ICD-10-CM | POA: Diagnosis not present

## 2022-11-07 DIAGNOSIS — E7841 Elevated Lipoprotein(a): Secondary | ICD-10-CM | POA: Diagnosis not present

## 2022-11-07 DIAGNOSIS — E039 Hypothyroidism, unspecified: Secondary | ICD-10-CM | POA: Diagnosis not present

## 2022-11-07 DIAGNOSIS — E559 Vitamin D deficiency, unspecified: Secondary | ICD-10-CM | POA: Diagnosis not present

## 2022-12-03 ENCOUNTER — Encounter (HOSPITAL_COMMUNITY): Payer: Self-pay | Admitting: Family Medicine

## 2022-12-03 DIAGNOSIS — S52502D Unspecified fracture of the lower end of left radius, subsequent encounter for closed fracture with routine healing: Secondary | ICD-10-CM | POA: Diagnosis not present

## 2022-12-04 ENCOUNTER — Other Ambulatory Visit (HOSPITAL_COMMUNITY): Payer: Self-pay | Admitting: Family Medicine

## 2022-12-04 DIAGNOSIS — Z13820 Encounter for screening for osteoporosis: Secondary | ICD-10-CM

## 2022-12-23 DIAGNOSIS — R947 Abnormal results of other endocrine function studies: Secondary | ICD-10-CM | POA: Diagnosis not present

## 2022-12-23 DIAGNOSIS — R5383 Other fatigue: Secondary | ICD-10-CM | POA: Diagnosis not present

## 2022-12-23 DIAGNOSIS — E039 Hypothyroidism, unspecified: Secondary | ICD-10-CM | POA: Diagnosis not present

## 2022-12-23 DIAGNOSIS — E559 Vitamin D deficiency, unspecified: Secondary | ICD-10-CM | POA: Diagnosis not present

## 2022-12-23 DIAGNOSIS — Z7689 Persons encountering health services in other specified circumstances: Secondary | ICD-10-CM | POA: Diagnosis not present

## 2022-12-23 DIAGNOSIS — R7301 Impaired fasting glucose: Secondary | ICD-10-CM | POA: Diagnosis not present

## 2022-12-23 DIAGNOSIS — D6489 Other specified anemias: Secondary | ICD-10-CM | POA: Diagnosis not present

## 2022-12-23 DIAGNOSIS — R799 Abnormal finding of blood chemistry, unspecified: Secondary | ICD-10-CM | POA: Diagnosis not present

## 2022-12-23 DIAGNOSIS — R946 Abnormal results of thyroid function studies: Secondary | ICD-10-CM | POA: Diagnosis not present

## 2022-12-23 DIAGNOSIS — E063 Autoimmune thyroiditis: Secondary | ICD-10-CM | POA: Diagnosis not present

## 2022-12-23 DIAGNOSIS — E2749 Other adrenocortical insufficiency: Secondary | ICD-10-CM | POA: Diagnosis not present

## 2022-12-23 DIAGNOSIS — E782 Mixed hyperlipidemia: Secondary | ICD-10-CM | POA: Diagnosis not present

## 2022-12-30 ENCOUNTER — Other Ambulatory Visit (HOSPITAL_COMMUNITY): Payer: Self-pay | Admitting: Internal Medicine

## 2022-12-30 DIAGNOSIS — D1771 Benign lipomatous neoplasm of kidney: Secondary | ICD-10-CM

## 2023-01-06 ENCOUNTER — Ambulatory Visit (HOSPITAL_COMMUNITY)
Admission: RE | Admit: 2023-01-06 | Discharge: 2023-01-06 | Disposition: A | Discharge status: Home / Self Care | Payer: Medicare HMO | Source: Ambulatory Visit | Attending: Internal Medicine | Admitting: Internal Medicine

## 2023-01-06 DIAGNOSIS — D1771 Benign lipomatous neoplasm of kidney: Secondary | ICD-10-CM | POA: Diagnosis not present

## 2023-01-06 DIAGNOSIS — N281 Cyst of kidney, acquired: Secondary | ICD-10-CM | POA: Diagnosis not present

## 2023-01-06 DIAGNOSIS — N2889 Other specified disorders of kidney and ureter: Secondary | ICD-10-CM | POA: Diagnosis not present

## 2023-01-19 DIAGNOSIS — S52502A Unspecified fracture of the lower end of left radius, initial encounter for closed fracture: Secondary | ICD-10-CM | POA: Diagnosis not present

## 2023-01-22 DIAGNOSIS — X58XXXA Exposure to other specified factors, initial encounter: Secondary | ICD-10-CM | POA: Diagnosis not present

## 2023-01-22 DIAGNOSIS — Y999 Unspecified external cause status: Secondary | ICD-10-CM | POA: Diagnosis not present

## 2023-01-22 DIAGNOSIS — S52502A Unspecified fracture of the lower end of left radius, initial encounter for closed fracture: Secondary | ICD-10-CM | POA: Diagnosis not present

## 2023-01-22 DIAGNOSIS — S52572A Other intraarticular fracture of lower end of left radius, initial encounter for closed fracture: Secondary | ICD-10-CM | POA: Diagnosis not present

## 2023-02-09 DIAGNOSIS — M25532 Pain in left wrist: Secondary | ICD-10-CM | POA: Diagnosis not present

## 2023-02-09 DIAGNOSIS — M25632 Stiffness of left wrist, not elsewhere classified: Secondary | ICD-10-CM | POA: Diagnosis not present

## 2023-02-09 DIAGNOSIS — M6281 Muscle weakness (generalized): Secondary | ICD-10-CM | POA: Diagnosis not present

## 2023-02-12 DIAGNOSIS — M25632 Stiffness of left wrist, not elsewhere classified: Secondary | ICD-10-CM | POA: Diagnosis not present

## 2023-02-12 DIAGNOSIS — M25532 Pain in left wrist: Secondary | ICD-10-CM | POA: Diagnosis not present

## 2023-02-12 DIAGNOSIS — M6281 Muscle weakness (generalized): Secondary | ICD-10-CM | POA: Diagnosis not present

## 2023-02-16 DIAGNOSIS — M25532 Pain in left wrist: Secondary | ICD-10-CM | POA: Diagnosis not present

## 2023-02-16 DIAGNOSIS — M25632 Stiffness of left wrist, not elsewhere classified: Secondary | ICD-10-CM | POA: Diagnosis not present

## 2023-02-16 DIAGNOSIS — M6281 Muscle weakness (generalized): Secondary | ICD-10-CM | POA: Diagnosis not present

## 2023-02-19 DIAGNOSIS — M25632 Stiffness of left wrist, not elsewhere classified: Secondary | ICD-10-CM | POA: Diagnosis not present

## 2023-02-19 DIAGNOSIS — M25532 Pain in left wrist: Secondary | ICD-10-CM | POA: Diagnosis not present

## 2023-02-19 DIAGNOSIS — M6281 Muscle weakness (generalized): Secondary | ICD-10-CM | POA: Diagnosis not present

## 2023-02-23 DIAGNOSIS — M25532 Pain in left wrist: Secondary | ICD-10-CM | POA: Diagnosis not present

## 2023-02-23 DIAGNOSIS — M25632 Stiffness of left wrist, not elsewhere classified: Secondary | ICD-10-CM | POA: Diagnosis not present

## 2023-02-23 DIAGNOSIS — M6281 Muscle weakness (generalized): Secondary | ICD-10-CM | POA: Diagnosis not present

## 2023-02-27 DIAGNOSIS — M25632 Stiffness of left wrist, not elsewhere classified: Secondary | ICD-10-CM | POA: Diagnosis not present

## 2023-02-27 DIAGNOSIS — M25532 Pain in left wrist: Secondary | ICD-10-CM | POA: Diagnosis not present

## 2023-02-27 DIAGNOSIS — M6281 Muscle weakness (generalized): Secondary | ICD-10-CM | POA: Diagnosis not present

## 2023-03-02 DIAGNOSIS — S52502D Unspecified fracture of the lower end of left radius, subsequent encounter for closed fracture with routine healing: Secondary | ICD-10-CM | POA: Diagnosis not present

## 2023-03-02 DIAGNOSIS — M6281 Muscle weakness (generalized): Secondary | ICD-10-CM | POA: Diagnosis not present

## 2023-03-02 DIAGNOSIS — M25632 Stiffness of left wrist, not elsewhere classified: Secondary | ICD-10-CM | POA: Diagnosis not present

## 2023-03-02 DIAGNOSIS — M25532 Pain in left wrist: Secondary | ICD-10-CM | POA: Diagnosis not present

## 2023-03-05 DIAGNOSIS — M25632 Stiffness of left wrist, not elsewhere classified: Secondary | ICD-10-CM | POA: Diagnosis not present

## 2023-03-05 DIAGNOSIS — M25532 Pain in left wrist: Secondary | ICD-10-CM | POA: Diagnosis not present

## 2023-03-05 DIAGNOSIS — M6281 Muscle weakness (generalized): Secondary | ICD-10-CM | POA: Diagnosis not present

## 2023-03-09 DIAGNOSIS — M25532 Pain in left wrist: Secondary | ICD-10-CM | POA: Diagnosis not present

## 2023-03-09 DIAGNOSIS — M6281 Muscle weakness (generalized): Secondary | ICD-10-CM | POA: Diagnosis not present

## 2023-03-09 DIAGNOSIS — M25632 Stiffness of left wrist, not elsewhere classified: Secondary | ICD-10-CM | POA: Diagnosis not present

## 2023-03-12 DIAGNOSIS — M25632 Stiffness of left wrist, not elsewhere classified: Secondary | ICD-10-CM | POA: Diagnosis not present

## 2023-03-12 DIAGNOSIS — M25532 Pain in left wrist: Secondary | ICD-10-CM | POA: Diagnosis not present

## 2023-03-12 DIAGNOSIS — M6281 Muscle weakness (generalized): Secondary | ICD-10-CM | POA: Diagnosis not present

## 2023-03-16 DIAGNOSIS — M25632 Stiffness of left wrist, not elsewhere classified: Secondary | ICD-10-CM | POA: Diagnosis not present

## 2023-03-16 DIAGNOSIS — M25532 Pain in left wrist: Secondary | ICD-10-CM | POA: Diagnosis not present

## 2023-03-16 DIAGNOSIS — M6281 Muscle weakness (generalized): Secondary | ICD-10-CM | POA: Diagnosis not present

## 2023-03-19 DIAGNOSIS — M25532 Pain in left wrist: Secondary | ICD-10-CM | POA: Diagnosis not present

## 2023-03-19 DIAGNOSIS — M25632 Stiffness of left wrist, not elsewhere classified: Secondary | ICD-10-CM | POA: Diagnosis not present

## 2023-03-19 DIAGNOSIS — M6281 Muscle weakness (generalized): Secondary | ICD-10-CM | POA: Diagnosis not present

## 2023-03-20 DIAGNOSIS — Z0001 Encounter for general adult medical examination with abnormal findings: Secondary | ICD-10-CM | POA: Diagnosis not present

## 2023-03-20 DIAGNOSIS — I700 Atherosclerosis of aorta: Secondary | ICD-10-CM | POA: Diagnosis not present

## 2023-03-20 DIAGNOSIS — I10 Essential (primary) hypertension: Secondary | ICD-10-CM | POA: Diagnosis not present

## 2023-03-20 DIAGNOSIS — K5730 Diverticulosis of large intestine without perforation or abscess without bleeding: Secondary | ICD-10-CM | POA: Diagnosis not present

## 2023-03-20 DIAGNOSIS — N1832 Chronic kidney disease, stage 3b: Secondary | ICD-10-CM | POA: Diagnosis not present

## 2023-03-20 DIAGNOSIS — N281 Cyst of kidney, acquired: Secondary | ICD-10-CM | POA: Diagnosis not present

## 2023-03-20 DIAGNOSIS — E039 Hypothyroidism, unspecified: Secondary | ICD-10-CM | POA: Diagnosis not present

## 2023-03-20 DIAGNOSIS — D1771 Benign lipomatous neoplasm of kidney: Secondary | ICD-10-CM | POA: Diagnosis not present

## 2023-03-20 DIAGNOSIS — J4520 Mild intermittent asthma, uncomplicated: Secondary | ICD-10-CM | POA: Diagnosis not present

## 2023-03-20 DIAGNOSIS — I129 Hypertensive chronic kidney disease with stage 1 through stage 4 chronic kidney disease, or unspecified chronic kidney disease: Secondary | ICD-10-CM | POA: Diagnosis not present

## 2023-03-20 DIAGNOSIS — E785 Hyperlipidemia, unspecified: Secondary | ICD-10-CM | POA: Diagnosis not present

## 2023-03-20 DIAGNOSIS — R109 Unspecified abdominal pain: Secondary | ICD-10-CM | POA: Diagnosis not present

## 2023-06-01 ENCOUNTER — Other Ambulatory Visit (HOSPITAL_COMMUNITY): Payer: Self-pay | Admitting: Family Medicine

## 2023-06-01 DIAGNOSIS — D1771 Benign lipomatous neoplasm of kidney: Secondary | ICD-10-CM

## 2023-06-11 ENCOUNTER — Ambulatory Visit (HOSPITAL_COMMUNITY)
Admission: RE | Admit: 2023-06-11 | Discharge: 2023-06-11 | Disposition: A | Discharge status: Home / Self Care | Source: Ambulatory Visit | Attending: Family Medicine | Admitting: Family Medicine

## 2023-06-11 DIAGNOSIS — N2889 Other specified disorders of kidney and ureter: Secondary | ICD-10-CM | POA: Diagnosis not present

## 2023-06-11 DIAGNOSIS — D1771 Benign lipomatous neoplasm of kidney: Secondary | ICD-10-CM | POA: Diagnosis not present

## 2023-06-11 DIAGNOSIS — N281 Cyst of kidney, acquired: Secondary | ICD-10-CM | POA: Insufficient documentation

## 2023-06-18 DIAGNOSIS — J4520 Mild intermittent asthma, uncomplicated: Secondary | ICD-10-CM | POA: Diagnosis not present

## 2023-06-18 DIAGNOSIS — E669 Obesity, unspecified: Secondary | ICD-10-CM | POA: Diagnosis not present

## 2023-06-18 DIAGNOSIS — I10 Essential (primary) hypertension: Secondary | ICD-10-CM | POA: Diagnosis not present

## 2023-06-18 DIAGNOSIS — K219 Gastro-esophageal reflux disease without esophagitis: Secondary | ICD-10-CM | POA: Diagnosis not present

## 2023-06-18 DIAGNOSIS — R053 Chronic cough: Secondary | ICD-10-CM | POA: Diagnosis not present

## 2023-06-18 DIAGNOSIS — J302 Other seasonal allergic rhinitis: Secondary | ICD-10-CM | POA: Diagnosis not present

## 2023-09-29 ENCOUNTER — Other Ambulatory Visit (HOSPITAL_COMMUNITY): Payer: Self-pay | Admitting: Obstetrics & Gynecology

## 2023-09-29 DIAGNOSIS — Z1231 Encounter for screening mammogram for malignant neoplasm of breast: Secondary | ICD-10-CM

## 2023-10-01 DIAGNOSIS — R109 Unspecified abdominal pain: Secondary | ICD-10-CM | POA: Diagnosis not present

## 2023-10-01 DIAGNOSIS — K219 Gastro-esophageal reflux disease without esophagitis: Secondary | ICD-10-CM | POA: Diagnosis not present

## 2023-10-01 DIAGNOSIS — N1832 Chronic kidney disease, stage 3b: Secondary | ICD-10-CM | POA: Diagnosis not present

## 2023-10-01 DIAGNOSIS — I10 Essential (primary) hypertension: Secondary | ICD-10-CM | POA: Diagnosis not present

## 2023-10-01 DIAGNOSIS — J4520 Mild intermittent asthma, uncomplicated: Secondary | ICD-10-CM | POA: Diagnosis not present

## 2023-10-01 DIAGNOSIS — E785 Hyperlipidemia, unspecified: Secondary | ICD-10-CM | POA: Diagnosis not present

## 2023-10-01 DIAGNOSIS — R053 Chronic cough: Secondary | ICD-10-CM | POA: Diagnosis not present

## 2023-10-01 DIAGNOSIS — E039 Hypothyroidism, unspecified: Secondary | ICD-10-CM | POA: Diagnosis not present

## 2023-10-01 DIAGNOSIS — K5730 Diverticulosis of large intestine without perforation or abscess without bleeding: Secondary | ICD-10-CM | POA: Diagnosis not present

## 2023-10-01 DIAGNOSIS — D1771 Benign lipomatous neoplasm of kidney: Secondary | ICD-10-CM | POA: Diagnosis not present

## 2023-10-01 DIAGNOSIS — I129 Hypertensive chronic kidney disease with stage 1 through stage 4 chronic kidney disease, or unspecified chronic kidney disease: Secondary | ICD-10-CM | POA: Diagnosis not present

## 2023-10-01 DIAGNOSIS — J302 Other seasonal allergic rhinitis: Secondary | ICD-10-CM | POA: Diagnosis not present

## 2023-10-05 ENCOUNTER — Other Ambulatory Visit (HOSPITAL_COMMUNITY): Payer: Self-pay | Admitting: Radiology

## 2023-10-05 DIAGNOSIS — J4520 Mild intermittent asthma, uncomplicated: Secondary | ICD-10-CM

## 2023-10-07 DIAGNOSIS — H25813 Combined forms of age-related cataract, bilateral: Secondary | ICD-10-CM | POA: Diagnosis not present

## 2023-10-14 ENCOUNTER — Ambulatory Visit (HOSPITAL_COMMUNITY)
Admission: RE | Admit: 2023-10-14 | Discharge: 2023-10-14 | Disposition: A | Discharge status: Home / Self Care | Source: Ambulatory Visit | Attending: Family Medicine | Admitting: Family Medicine

## 2023-10-14 ENCOUNTER — Ambulatory Visit (HOSPITAL_COMMUNITY)
Admission: RE | Admit: 2023-10-14 | Discharge: 2023-10-14 | Disposition: A | Discharge status: Home / Self Care | Source: Ambulatory Visit | Attending: Obstetrics & Gynecology | Admitting: Obstetrics & Gynecology

## 2023-10-14 ENCOUNTER — Encounter (HOSPITAL_COMMUNITY): Payer: Self-pay

## 2023-10-14 DIAGNOSIS — M810 Age-related osteoporosis without current pathological fracture: Secondary | ICD-10-CM | POA: Diagnosis not present

## 2023-10-14 DIAGNOSIS — Z13820 Encounter for screening for osteoporosis: Secondary | ICD-10-CM | POA: Diagnosis not present

## 2023-10-14 DIAGNOSIS — Z8781 Personal history of (healed) traumatic fracture: Secondary | ICD-10-CM | POA: Insufficient documentation

## 2023-10-14 DIAGNOSIS — Z1231 Encounter for screening mammogram for malignant neoplasm of breast: Secondary | ICD-10-CM | POA: Insufficient documentation

## 2023-10-14 DIAGNOSIS — Z780 Asymptomatic menopausal state: Secondary | ICD-10-CM | POA: Diagnosis not present

## 2023-10-19 ENCOUNTER — Ambulatory Visit: Payer: Self-pay | Admitting: Obstetrics & Gynecology

## 2023-10-22 ENCOUNTER — Ambulatory Visit (HOSPITAL_COMMUNITY)
Admission: RE | Admit: 2023-10-22 | Discharge: 2023-10-22 | Disposition: A | Discharge status: Home / Self Care | Source: Ambulatory Visit | Attending: Internal Medicine | Admitting: Internal Medicine

## 2023-10-22 DIAGNOSIS — J4520 Mild intermittent asthma, uncomplicated: Secondary | ICD-10-CM | POA: Insufficient documentation

## 2023-10-22 LAB — PULMONARY FUNCTION TEST
DL/VA % pred: 111 %
DL/VA: 4.55 ml/min/mmHg/L
DLCO unc % pred: 99 %
DLCO unc: 19.87 ml/min/mmHg
FEF 25-75 Post: 1.9 L/s
FEF 25-75 Pre: 1.82 L/s
FEF2575-%Change-Post: 4 %
FEF2575-%Pred-Post: 113 %
FEF2575-%Pred-Pre: 108 %
FEV1-%Change-Post: 1 %
FEV1-%Pred-Post: 92 %
FEV1-%Pred-Pre: 90 %
FEV1-Post: 2.06 L
FEV1-Pre: 2.03 L
FEV1FVC-%Change-Post: 6 %
FEV1FVC-%Pred-Pre: 104 %
FEV6-%Change-Post: -4 %
FEV6-%Pred-Post: 87 %
FEV6-%Pred-Pre: 91 %
FEV6-Post: 2.48 L
FEV6-Pre: 2.6 L
FEV6FVC-%Change-Post: 1 %
FEV6FVC-%Pred-Post: 105 %
FEV6FVC-%Pred-Pre: 104 %
FVC-%Change-Post: -5 %
FVC-%Pred-Post: 83 %
FVC-%Pred-Pre: 87 %
FVC-Post: 2.48 L
FVC-Pre: 2.62 L
Post FEV1/FVC ratio: 83 %
Post FEV6/FVC ratio: 100 %
Pre FEV1/FVC ratio: 78 %
Pre FEV6/FVC Ratio: 99 %
RV % pred: 85 %
RV: 2.08 L
TLC % pred: 90 %
TLC: 4.84 L

## 2023-10-22 MED ORDER — ALBUTEROL SULFATE (2.5 MG/3ML) 0.083% IN NEBU
2.5000 mg | INHALATION_SOLUTION | Freq: Once | RESPIRATORY_TRACT | Status: AC
  Administered 2023-10-22: 2.5 mg via RESPIRATORY_TRACT

## 2023-12-03 DIAGNOSIS — N1832 Chronic kidney disease, stage 3b: Secondary | ICD-10-CM | POA: Diagnosis not present

## 2023-12-03 DIAGNOSIS — I129 Hypertensive chronic kidney disease with stage 1 through stage 4 chronic kidney disease, or unspecified chronic kidney disease: Secondary | ICD-10-CM | POA: Diagnosis not present

## 2023-12-03 DIAGNOSIS — I5032 Chronic diastolic (congestive) heart failure: Secondary | ICD-10-CM | POA: Diagnosis not present

## 2024-01-13 ENCOUNTER — Other Ambulatory Visit: Payer: Self-pay

## 2024-01-13 ENCOUNTER — Encounter: Payer: Self-pay | Admitting: Allergy & Immunology

## 2024-01-13 ENCOUNTER — Ambulatory Visit: Admitting: Allergy & Immunology

## 2024-01-13 DIAGNOSIS — J4540 Moderate persistent asthma, uncomplicated: Secondary | ICD-10-CM | POA: Diagnosis not present

## 2024-01-13 DIAGNOSIS — J310 Chronic rhinitis: Secondary | ICD-10-CM | POA: Diagnosis not present

## 2024-01-13 NOTE — Progress Notes (Signed)
 NEW PATIENT  Date of Service/Encounter:  01/13/24  Consult requested by: Shona Norleen PEDLAR, MD   Assessment:   Moderate persistent asthma, uncomplicated  Chronic rhinitis - needing skin testing, but she is moving soon  Plan/Recommendations:   1. Moderate persistent asthma, uncomplicated - Lung testing looks great today which is good news. - Symptoms are certainly consistent with asthma judging by your response to the Symbicort when you need it. - However, you might just need medications during flares from various triggers. - Daily controller medication(s): NOTHING - Changes during respiratory infections or worsening symptoms: Add on Symbicort two puffs every 4-6 hours as needed for 1-2 weeks.  - Asthma control goals:  * Full participation in all desired activities (may need albuterol  before activity) * Albuterol  use two time or less a week on average (not counting use with activity) * Cough interfering with sleep two time or less a month * Oral steroids no more than once a year * No hospitalizations  2. Chronic rhinitis - Because of insurance stipulations, we cannot do skin testing on the same day as your first visit. - We are all working to fight this, but for now we need to do two separate visits.  - We will know more after we do testing at the next visit.  - The skin testing visit can be squeezed in at your convenience.  - Then we can make a more full plan to address all of your symptoms. - Be sure to stop your antihistamines for 3 days before this appointment.   3. Return in about 1 week (around 01/20/2024) for SKIN TESTING (1-55). You can have the follow up appointment with Dr. Iva or a Nurse Practicioner (our Nurse Practitioners are excellent and always have Physician oversight!).    This note in its entirety was forwarded to the Provider who requested this consultation.  Subjective:   Tracie Brooks is a 78 y.o. female presenting today for evaluation of  Chief  Complaint  Patient presents with   Cough    Constant cough    Tracie Brooks has a history of the following: Patient Active Problem List   Diagnosis Date Noted   LUQ pain 07/14/2022   Closed right hip fracture, initial encounter (HCC) 05/27/2018   Osteopenia 12/28/2015   Hypercalcemia 09/04/2015   Colon cancer screening 08/29/2015   Arthritis 06/24/2012   Hemiparesis affecting dominant side as late effect of cerebrovascular accident (HCC) 07/15/2011   DYSLIPIDEMIA 12/05/2008   Blood pressure elevated without history of HTN 12/05/2008   Asthma 12/05/2008   CHEST PAIN 12/05/2008    History obtained from: chart review and patient.  Discussed the use of AI scribe software for clinical note transcription with the patient and/or guardian, who gave verbal consent to proceed.  Tracie Brooks was referred by Shona Norleen PEDLAR, MD.     Tracie Brooks is a 78 y.o. female presenting for an evaluation of possible asthma and allergies.  She was diagnosed with asthma several years ago but has never experienced a typical asthma event. She describes a severe cough that began after Christmas last year and persisted for six months, often leading to vomiting. The cough was constant throughout the day with no identifiable triggers. Initially, albuterol  was prescribed but was ineffective, and she was later switched to an inhaler containing budesonide, which significantly reduced her coughing episodes. She currently uses the inhaler as needed, primarily at night, and reports no current issues with coughing.  A breathing test  was conducted on July 7th. She has never required prednisone  for breathing issues. She mentions having seasonal allergies, particularly to mold after rain and dust in the fall, which exacerbate her symptoms. She also has environmental allergies to chemical-based products and perfumes, which have worsened over the years, causing her to cough almost instantly when exposed.  She has a history of avoiding  antibiotics due to a severe reaction in 2018 following treatment for an ear infection, which led to a compromised immune system and skin issues. She prefers to avoid medications unless necessary due to past experiences with side effects, such as cognitive impairment from gabapentin .  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 She experiences heartburn, which she has had all her life. She moved to the area in 1996 due to her husband's work and is planning to move to [place]  to be closer to her daughter and grandchildren. No current cough, vomiting, diarrhea, or reflux. Reports seasonal allergies and sensitivity to strong smells.     Otherwise, there is no history of other atopic diseases, including drug allergies, stinging insect allergies, or contact dermatitis. There is no significant infectious history. Vaccinations are up to date.    Past Medical History: Patient Active Problem List   Diagnosis Date Noted   LUQ pain 07/14/2022   Closed right hip fracture, initial encounter (HCC) 05/27/2018   Osteopenia 12/28/2015   Hypercalcemia 09/04/2015   Colon cancer screening 08/29/2015   Arthritis 06/24/2012   Hemiparesis affecting dominant side as late effect of cerebrovascular accident (HCC) 07/15/2011   DYSLIPIDEMIA 12/05/2008   Blood pressure elevated without history of HTN 12/05/2008   Asthma 12/05/2008   CHEST PAIN 12/05/2008    Medication List:  Allergies as of 01/13/2024       Reactions   Beta Adrenergic Blockers Swelling   swelling   Levsin [hyoscyamine Sulfate] Other (See Comments)   Blurred vision   Neurontin  [gabapentin ] Other (See Comments)   FOGGY BRAIN/CONSTIPATION   Pravastatin Other (See Comments)   Joint pain   Sulfa Antibiotics Other (See Comments)   Mom had it and pt was told never to take it    Vicodin [hydrocodone -acetaminophen ] Other (See Comments)   unknown   Zocor [simvastatin] Other (See Comments)   Joint  pain   Ibuprofen Rash   Lidocaine Rash   cream        Medication List        Accurate as of January 13, 2024  1:25 PM. If you have any questions, ask your nurse or doctor.          Armour Thyroid  30 MG tablet Generic drug: thyroid  Take 30 mg by mouth daily.   b complex vitamins capsule Take 1 capsule by mouth daily.   budesonide-formoterol 160-4.5 MCG/ACT inhaler Commonly known as: SYMBICORT Inhale 2 puffs into the lungs 2 (two) times daily.   hydrochlorothiazide  12.5 MG tablet Commonly known as: HYDRODIURIL  Take 12.5 mg by mouth daily.   lisinopril 10 MG tablet Commonly known as: ZESTRIL Take 1 tablet by mouth daily.   losartan 50 MG tablet Commonly known as: COZAAR Take 50 mg by mouth daily.   OVER THE COUNTER MEDICATION Pure NAC Bid with meals   OVER THE COUNTER MEDICATION D3 K2 once per day   OVER THE COUNTER MEDICATION Collagen Powder once daily   PROGESTERONE  PO Take by mouth.        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 They live in a house that is 71 years old. There is hardwood in the main living areas and carpeting in the bedroom. They have electric heating and a heat pump for heating and cooling. There are two dogs in the home and one cat. There are dust mite coverings on the pillows, but not the bedding. There is no tobacco exposure in the home. She is currently retired. They are moving to [place]  in a few weeks to follow her daughter and son-in-law. There is no HEPA filter in the home. They do not live near an interstate or industrial area.    Review of systems otherwise negative other than that mentioned in the HPI.    Objective:   There were no vitals taken for this visit. There is no height or weight on file to calculate BMI.     Physical Exam Constitutional:      Appearance: She is well-developed.  HENT:     Head: Normocephalic and atraumatic.     Right Ear: Tympanic membrane, ear canal and external ear normal. No drainage, swelling or tenderness. Tympanic membrane is not injected, scarred, erythematous, retracted or bulging.     Left Ear: Tympanic membrane, ear canal and external ear normal. No drainage, swelling or tenderness. Tympanic membrane is not injected, scarred, erythematous, retracted or bulging.     Nose: Rhinorrhea present. No nasal deformity, septal deviation or mucosal edema.     Right Turbinates: Not enlarged or swollen.     Left Turbinates: Not enlarged or swollen.     Right Sinus: No maxillary sinus tenderness or frontal sinus tenderness.     Left Sinus: No maxillary sinus tenderness or frontal sinus tenderness.     Mouth/Throat:     Mouth: Mucous membranes are not pale and not dry.     Pharynx: Uvula midline.  Eyes:     General:        Right eye: No discharge.        Left eye: No discharge.     Conjunctiva/sclera: Conjunctivae normal.     Right eye: Right conjunctiva is not injected. No chemosis.    Left eye: Left  conjunctiva is not injected. No chemosis.    Pupils: Pupils are equal, round, and reactive to light.  Cardiovascular:     Rate and Rhythm: Normal rate and regular rhythm.  Heart sounds: S1 normal and S2 normal. Murmur heard.     Systolic murmur is present with a grade of 3/6.  Pulmonary:     Effort: Pulmonary effort is normal. No tachypnea, accessory muscle usage or respiratory distress.     Breath sounds: Normal breath sounds. No wheezing, rhonchi or rales.  Chest:     Chest wall: No tenderness.  Abdominal:     Tenderness: There is no abdominal tenderness. There is no guarding or rebound.  Lymphadenopathy:     Head:     Right side of head: No submandibular, tonsillar or occipital adenopathy.     Left side of head: No submandibular, tonsillar or occipital adenopathy.     Cervical: No cervical adenopathy.  Skin:    Coloration: Skin is not pale.     Findings: No abrasion, erythema, petechiae or rash. Rash is not papular, urticarial or vesicular.  Neurological:     Mental Status: She is alert.      Diagnostic studies:    Spirometry: results normal (FEV1: 1.42/82%, FVC: 2.01/89%, FEV1/FVC: 71%).    Spirometry consistent with normal pattern.   Allergy  Studies: deferred due to insurance stipulations that require a separate visit for testing          Marty Shaggy, MD [facility] 

## 2024-01-13 NOTE — Patient Instructions (Addendum)
 1. Moderate persistent asthma, uncomplicated - Lung testing looks great today which is good news. - Symptoms are certainly consistent with asthma judging by your response to the Symbicort when you need it. - However, you might just need medications during flares from various triggers. - Daily controller medication(s): NOTHING - Changes during respiratory infections or worsening symptoms: Add on Symbicort two puffs every 4-6 hours as needed for 1-2 weeks.  - Asthma control goals:  * Full participation in all desired activities (may need albuterol  before activity) * Albuterol  use two time or less a week on average (not counting use with activity) * Cough interfering with sleep two time or less a month * Oral steroids no more than once a year * No hospitalizations  2. Chronic rhinitis - Because of insurance stipulations, we cannot do skin testing on the same day as your first visit. - We are all working to fight this, but for now we need to do two separate visits.  - We will know more after we do testing at the next visit.  - The skin testing visit can be squeezed in at your convenience.  - Then we can make a more full plan to address all of your symptoms. - Be sure to stop your antihistamines for 3 days before this appointment.   3. Return in about 1 week (around 01/20/2024) for SKIN TESTING (1-55). You can have the follow up appointment with Dr. Iva or a Nurse Practicioner (our Nurse Practitioners are excellent and always have Physician oversight!).    Please inform us  of any Emergency Department visits, hospitalizations, or changes in symptoms. Call us  before going to the ED for breathing or allergy  symptoms since we might be able to fit you in for a sick visit. Feel free to contact us  anytime with any questions, problems, or concerns.  It was a pleasure to meet you today!  Websites that have reliable patient information: 1. American Academy of Asthma, Allergy , and Immunology:  www.aaaai.org 2. Food Allergy  Research and Education (FARE): foodallergy.org 3. Mothers of Asthmatics: http://www.asthmacommunitynetwork.org 4. American College of Allergy , Asthma, and Immunology: www.acaai.org      "Like" us  on Facebook and Instagram for our latest updates!      A healthy democracy works best when [employer] participate! Make sure you are registered to vote! If you have moved or changed any of your contact information, you will need to get this updated before voting! Scan the QR codes below to learn more!

## 2024-02-08 ENCOUNTER — Ambulatory Visit: Admitting: Urology

## 2024-02-08 ENCOUNTER — Encounter: Payer: Self-pay | Admitting: Urology

## 2024-02-08 VITALS — BP 131/72 | HR 76 | Ht 66.0 in | Wt 190.0 lb

## 2024-02-08 DIAGNOSIS — D1771 Benign lipomatous neoplasm of kidney: Secondary | ICD-10-CM

## 2024-02-08 DIAGNOSIS — D179 Benign lipomatous neoplasm, unspecified: Secondary | ICD-10-CM

## 2024-02-08 LAB — URINALYSIS, ROUTINE W REFLEX MICROSCOPIC
Bilirubin, UA: NEGATIVE
Glucose, UA: NEGATIVE
Ketones, UA: NEGATIVE
Nitrite, UA: NEGATIVE
Protein,UA: NEGATIVE
RBC, UA: NEGATIVE
Specific Gravity, UA: <1.005 — ABNORMAL LOW (ref 1.005–1.030)
Urobilinogen, Ur: 0.2 mg/dL (ref 0.2–1.0)
pH, UA: 6 (ref 5.0–7.5)

## 2024-02-08 LAB — MICROSCOPIC EXAMINATION

## 2024-02-08 NOTE — Progress Notes (Unsigned)
 02/08/2024 11:47 AM   [name] Dec 28, 1945 [id number]  Referring provider: Delsie Riggs, NP [address] 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 He AML has grown from 3.5 two years ago to 4.9cm this year. No hematuria. NO flank pain. She denies nay significant LUTS   PMH: Past Medical History:  Diagnosis Date   Allergy     Asthma    Chest pain    Dyslipidemia    Hypertension    IBS (irritable bowel syndrome)    Murmur     Surgical History: Past Surgical History:  Procedure Laterality Date   ABDOMINAL HYSTERECTOMY     BIOPSY BREAST  1985   COLONOSCOPY     COLONOSCOPY N/A 09/03/2015   Procedure: COLONOSCOPY;  Surgeon: Margo LITTIE Haddock, MD;  [facility];  Service: Endoscopy;  Laterality: N/A;  1400   KNEE ARTHROSCOPY     right 1982   TOTAL HIP ARTHROPLASTY Right 05/28/2018   Procedure: TOTAL HIP ARTHROPLASTY ANTERIOR APPROACH;  Surgeon: Beverley Evalene BIRCH, MD;  Location: [facility];  Service: Orthopedics;  Laterality: Right;    Home Medications:  Allergies as of 02/08/2024       Reactions   Beta Adrenergic Blockers Swelling   swelling   Levsin [hyoscyamine Sulfate] Other (See Comments)   Blurred vision   Neurontin  [gabapentin ] Other (See Comments)   FOGGY BRAIN/CONSTIPATION   Pravastatin Other (See Comments)   Joint pain   Sulfa Antibiotics Other (See Comments)   Mom had it and pt was told never to take it    Vicodin [hydrocodone -acetaminophen ] Other (See Comments)   unknown   Zocor [simvastatin] Other (See Comments)   Joint pain   Ibuprofen Rash   Lidocaine Rash   cream        Medication List        Accurate as of February 08, 2024 11:47 AM. If you have any questions, ask your nurse or doctor.          Armour Thyroid  30 MG tablet Generic drug: thyroid  Take 30 mg by mouth daily.   b complex vitamins capsule Take 1 capsule by mouth daily.    budesonide-formoterol 160-4.5 MCG/ACT inhaler Commonly known as: SYMBICORT Inhale 2 puffs into the lungs 2 (two) times daily.   hydrochlorothiazide  12.5 MG tablet Commonly known as: HYDRODIURIL  Take 12.5 mg by mouth daily.   lisinopril 10 MG tablet Commonly known as: ZESTRIL Take 1 tablet by mouth daily.   losartan 50 MG tablet Commonly known as: COZAAR Take 50 mg by mouth daily.   OVER THE COUNTER MEDICATION Pure NAC Bid with meals   OVER THE COUNTER MEDICATION D3 K2 once per day   OVER THE COUNTER MEDICATION Collagen Powder once daily   PROGESTERONE  PO Take by mouth.        Allergies:  Allergies  Allergen Reactions   Beta Adrenergic Blockers Swelling    swelling   Levsin [Hyoscyamine Sulfate] Other (See Comments)    Blurred vision   Neurontin  [Gabapentin ] Other (See Comments)    FOGGY BRAIN/CONSTIPATION   Pravastatin Other (See Comments)    Joint pain   Sulfa Antibiotics Other (See Comments)    Mom had it and pt was told never to take it    Vicodin [Hydrocodone -Acetaminophen ] Other (See Comments)    unknown   Zocor [Simvastatin] Other (See Comments)    Joint pain  Ibuprofen Rash   Lidocaine Rash    cream    Family History: Family History  Problem Relation Age of Onset   Hypertension Mother 76   Hyperlipidemia Mother    Arthritis Mother    Hypertension Father 61   Heart disease Father        Pacemaker   Dementia Father    Heart disease Sister    Congestive Heart Failure Brother    Breast cancer Sister    Colon polyps Neg Hx    Colon cancer Neg Hx     Social History:  reports that she has never smoked. She has been exposed to tobacco smoke. She has never used smokeless tobacco. She reports that she does not drink alcohol and does not use drugs.  ROS: All other review of systems were reviewed and are negative except what is noted above in HPI  Physical Exam: BP 131/72   Pulse 76   Ht 5' 6 (1.676 m)   Wt 190 lb (86.2 kg)   BMI 30.67  kg/m   Constitutional:  Alert and oriented, No acute distress. HEENT: [place] AT, moist mucus membranes.  Trachea midline, no masses. Cardiovascular: No clubbing, cyanosis, or edema. Respiratory: Normal respiratory effort, no increased work of breathing. GI: Abdomen is soft, nontender, nondistended, no abdominal masses GU: No CVA tenderness.  Lymph: No cervical or inguinal lymphadenopathy. Skin: No rashes, bruises or suspicious lesions. Neurologic: Grossly intact, no focal deficits, moving all 4 extremities. Psychiatric: Normal mood and affect.  Laboratory Data: Lab Results  Component Value Date   WBC 8.2 05/29/2018   HGB 8.7 (L) 05/29/2018   HCT 28.5 (L) 05/29/2018   MCV 89.6 05/29/2018   PLT 207 05/29/2018    Lab Results  Component Value Date   CREATININE 0.79 05/28/2018    No results found for: PSA  No results found for: TESTOSTERONE   No results found for: HGBA1C  Urinalysis    Component Value Date/Time   COLORURINE YELLOW 11/17/2016 1110   APPEARANCEUR HAZY (A) 11/17/2016 1110   LABSPEC 1.009 11/17/2016 1110   PHURINE 7.0 11/17/2016 1110   GLUCOSEU NEGATIVE 11/17/2016 1110   HGBUR NEGATIVE 11/17/2016 1110   BILIRUBINUR NEGATIVE 11/17/2016 1110   KETONESUR NEGATIVE 11/17/2016 1110   PROTEINUR NEGATIVE 11/17/2016 1110   UROBILINOGEN 0.2 07/15/2011 1142   NITRITE NEGATIVE 11/17/2016 1110   LEUKOCYTESUR NEGATIVE 11/17/2016 1110    Lab Results  Component Value Date   BACTERIA RARE 10/01/2006    Pertinent Imaging: Ct 01/16/2022 and renal US  06/03/23: Images reviewed and discussed with the patient  No results found for this or any previous visit.  No results found for this or any previous visit.  No results found for this or any previous visit.  No results found for this or any previous visit.  Results for orders placed during the hospital encounter of 06/11/23  US  RENAL  Narrative CLINICAL DATA:  angiomyolipoma of right kidney  EXAM: RENAL /  URINARY TRACT ULTRASOUND COMPLETE  COMPARISON:  January 06, 2023, January 16, 2022  FINDINGS: Right Kidney:  Renal measurements: 9.9 x 5.0 x 4.5 cm = volume: 119 mL. Echogenicity within normal limits. No hydronephrosis visualized. Revisualization of an echogenic mass of the inferior pole of the RIGHT kidney. It is estimated to measure 4.3 x 4.2 by 4.9 cm, overall similar in comparison to prior given presence of indistinct margins. Similar appearance of a near anechoic mass of the superior pole measuring 12 x 10 x 9 mm. Additional  previously described favored cyst is not discretely visualized on today's exam.  Left Kidney:  Renal measurements: 10.2 x 5.5 x 4.9 cm = volume: 141 mL. Echogenicity within normal limits. No hydronephrosis visualized. Benign cysts are noted measuring up to 19 mm (for which no dedicated imaging follow-up is recommended).  Bladder:  Appears normal for degree of bladder distention.  Other:  None.  IMPRESSION: 1. Similar appearance of a 4.9 cm echogenic mass of the inferior pole of the RIGHT kidney. This is favored to reflect an angiomyolipoma. 2. Similar appearance of a 12 mm near anechoic mass of the superior pole of the RIGHT kidney. This is favored to reflect a mildly complicated cyst.   Electronically Signed By: Corean Salter M.D. On: 06/27/2023 22:00  No results found for this or any previous visit.  No results found for this or any previous visit.  No results found for this or any previous visit.   Assessment & Plan:    1. Angiomyolipoma (Primary) We discussed the natural hx of renal masses and the benign nature of AMLs. We disucssed the treatment options including active surveillance, renal mass embolization, partial and nephrectomy.  - Urinalysis, Routine w reflex microscopic   No follow-ups on file.  Belvie Clara, MD  [facility] [place]

## 2024-02-08 NOTE — Patient Instructions (Signed)
 A Growth in the Kidney (Renal Mass): What to Know  A renal mass is an abnormal growth in the kidney. It may be found during an MRI, CT scan, or ultrasound that's done to check for other problems in the belly. Some renal masses are cancerous, or malignant, and can grow or spread quickly. Others are benign, which means they're not cancer. Renal masses include: Tumors. These may be either cancerous or benign. The most common cancerous tumor in adults is called renal cell carcinoma. In children, the most common type is Wilms tumor. The most common kidney tumors that aren't cancer include renal adenomas, oncocytomas, and angiomyolipomas (AML). Cysts. These are pockets of fluid that form on or in the kidney. What are the causes? Certain types of cancers, infections, or injuries can cause a renal mass. It's not always known what causes a cyst to form in or on the kidney. What are the signs or symptoms? Often, a renal mass or kidney cyst doesn't cause any signs or symptoms. How is this diagnosed? Your health care provider may suggest tests to diagnose the cause of your renal mass. These tests may include: A physical exam. Blood tests. Pee (urine) tests. Imaging tests, such as: CT scan. MRI. Ultrasound. Chest X-ray or bone scan. These may be done if a tumor is cancerous to see if the cancer has spread outside the kidney. Biopsy. This is when a small piece of tissue is removed from the renal mass for testing. How is this treated? Treatment is not always needed for a renal mass. Treatment will depend on the cause of the mass and if it's causing any problems or symptoms. For a cancerous renal mass, treatment options may include: Surgery. This is done to remove the tumor and any affected tissue. Chemotherapy. This uses medicines to kill cancer cells. Radiation. High-energy X-rays or gamma rays are used to kill cancer cells. Ablation. This uses extreme hot or cold temperature to kill the cancer  cells. Immunotherapy. Medicines are used to help the body's defense system (immune system) fight the cancer cells. Taking part in clinical trials. This involves trying new or experimental treatments to see if they're effective. Most kidney cysts don't need to be treated. Follow these instructions at home: What you need to do at home will depend on the cause of the mass. Follow the instructions that your provider gives you. In general: Take medicines only as told. If you were given antibiotics, take them as told. Do not stop taking them even if you start to feel better. Follow any instructions from your provider about what you can and can't do. Do not smoke, vape, or use nicotine or tobacco. Keep all follow-up visits. Your provider will need to check if your renal mass has changed or grown. Contact a health care provider if: You have flank pain, which is pain in your side or back. You have a fever. You have a loss of appetite. You have pain or swelling in your belly. You lose weight. Get help right away if: Your pain gets worse. There's blood in your pee. You can't pee. This information is not intended to replace advice given to you by your health care provider. Make sure you discuss any questions you have with your health care provider. Document Revised: 09/12/2022 Document Reviewed: 09/12/2022 Elsevier Patient Education  2025 [employer].

## 2024-02-09 ENCOUNTER — Other Ambulatory Visit: Payer: Self-pay

## 2024-02-09 DIAGNOSIS — D179 Benign lipomatous neoplasm, unspecified: Secondary | ICD-10-CM

## 2024-02-09 NOTE — Progress Notes (Signed)
 Order placed from referral # [id number]

## 2024-02-15 ENCOUNTER — Ambulatory Visit
Admission: RE | Admit: 2024-02-15 | Discharge: 2024-02-15 | Disposition: A | Discharge status: Home / Self Care | Source: Ambulatory Visit | Attending: Urology | Admitting: Urology

## 2024-02-15 DIAGNOSIS — D179 Benign lipomatous neoplasm, unspecified: Secondary | ICD-10-CM

## 2024-02-15 DIAGNOSIS — D1771 Benign lipomatous neoplasm of kidney: Secondary | ICD-10-CM | POA: Diagnosis not present

## 2024-02-15 HISTORY — PX: IR RADIOLOGIST EVAL & MGMT: IMG5224

## 2024-02-15 NOTE — Consult Note (Signed)
 Chief Complaint: Patient was seen in consultation today for a right renal angiomyolipoma.  Referring Physician(s): McKenzie,Patrick L  History of Present Illness: Tracie Brooks is a 78 y.o. female with an incidentally discovered right renal angiomyolipoma that has been followed with ultrasound.  The lesion appears to be enlarging based on serial imaging measuring up to 4.9 cm on the renal ultrasound from 06/11/2023.  Patient has no history of retroperitoneal hemorrhage or flank pain.  She denies dysuria or hematuria.  Patient's main medical problem is associated with her allergies and persistent asthma.  Patient is planning to move to [place]  in the upcoming months but would like to have the right renal lesion treated before she moves.  Past Medical History:  Diagnosis Date   Allergy     Asthma    Chest pain    Dyslipidemia    Hypertension    IBS (irritable bowel syndrome)    Murmur     Past Surgical History:  Procedure Laterality Date   ABDOMINAL HYSTERECTOMY     BIOPSY BREAST  1985   COLONOSCOPY     COLONOSCOPY N/A 09/03/2015   Procedure: COLONOSCOPY;  Surgeon: Margo LITTIE Haddock, MD;  [facility];  Service: Endoscopy;  Laterality: N/A;  1400   KNEE ARTHROSCOPY     right 1982   TOTAL HIP ARTHROPLASTY Right 05/28/2018   Procedure: TOTAL HIP ARTHROPLASTY ANTERIOR APPROACH;  Surgeon: Beverley Evalene BIRCH, MD;  Location: [facility];  Service: Orthopedics;  Laterality: Right;    Allergies: Beta adrenergic blockers, Levsin [hyoscyamine sulfate], Neurontin  [gabapentin ], Pravastatin, Sulfa antibiotics, Vicodin [hydrocodone -acetaminophen ], Zocor [simvastatin], Ibuprofen, and Lidocaine  Medications: Prior to Admission medications   Medication Sig Start Date End Date Taking? Authorizing Provider  ARMOUR THYROID  30 MG tablet Take 30 mg by mouth daily. 10/31/21   [provider]  b complex vitamins capsule Take 1 capsule by mouth daily.    [provider]   budesonide-formoterol (SYMBICORT) 160-4.5 MCG/ACT inhaler Inhale 2 puffs into the lungs 2 (two) times daily.    [provider]  hydrochlorothiazide  (HYDRODIURIL ) 12.5 MG tablet Take 12.5 mg by mouth daily. 05/09/20   [provider]  lisinopril (ZESTRIL) 10 MG tablet Take 1 tablet by mouth daily. Patient not taking: Reported on 01/13/2024 06/24/20   [provider]  losartan (COZAAR) 50 MG tablet Take 50 mg by mouth daily.    [provider]  OVER THE COUNTER MEDICATION Pure NAC Bid with meals    [provider]  OVER THE COUNTER MEDICATION D3 K2 once per day    [provider]  OVER THE COUNTER MEDICATION Collagen Powder once daily    [provider]  PROGESTERONE  PO Take by mouth.    [provider]     Family History  Problem Relation Age of Onset   Hypertension Mother 65   Hyperlipidemia Mother    Arthritis Mother    Hypertension Father 61   Heart disease Father        Pacemaker   Dementia Father    Heart disease Sister    Congestive Heart Failure Brother    Breast cancer Sister    Colon polyps Neg Hx    Colon cancer Neg Hx     Social History   Socioeconomic History   Marital status: Married    Spouse name: Aliene   Number of children: 1   Years of education: Not on file   Highest education level: Master's degree (e.g., MA,  MS, MEng, MEd, MSW, MBA)  Occupational History   Occupation: Controller/HR  Tobacco Use   Smoking status: Never    Passive exposure: Past   Smokeless tobacco: Never  Vaping Use   Vaping status: Never Used  Substance and Sexual Activity   Alcohol use: No   Drug use: No   Sexual activity: Not Currently  Other Topics Concern   Not on file  Social History Narrative   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   No regular exercise      Patient is right-handed. She lives with her husband in a 2 level home. She walks most days.   Social Drivers of Research Scientist (physical Sciences) Strain: Not on file  Food Insecurity: Not on file  Transportation Needs: Not on file  Physical Activity: Not on file  Stress: Not on file  Social Connections: Not on file    ECOG Status: 0 - Asymptomatic   Review of Systems  Constitutional: Negative.   Respiratory:  Positive for cough.   Cardiovascular: Negative.   Gastrointestinal: Negative.   Genitourinary: Negative.     Vital Signs: BP (!) 167/70 (BP Location: Left Arm, Patient Position: Sitting, Cuff Size: Normal)   Pulse 75   Temp 97.6 F (36.4 C) (Oral)   Resp 18   SpO2 97%      Physical Exam Constitutional:      Appearance: She is not ill-appearing.  Cardiovascular:     Rate and Rhythm: Normal rate.  Pulmonary:     Effort: Pulmonary effort is normal.  Abdominal:     General: Abdomen is flat. There is no distension.     Palpations: Abdomen is soft.     Tenderness: There is no abdominal tenderness.  Neurological:     Mental Status: She is alert.       Imaging: CLINICAL DATA:  angiomyolipoma of right kidney   EXAM: RENAL / URINARY TRACT ULTRASOUND COMPLETE   COMPARISON:  January 06, 2023, January 16, 2022   FINDINGS: Right Kidney:   Renal measurements: 9.9 x 5.0 x 4.5 cm = volume: 119 mL. Echogenicity within normal limits. No hydronephrosis visualized. Revisualization of an echogenic mass of the inferior pole of the RIGHT kidney. It is estimated to measure 4.3 x 4.2 by 4.9 cm, overall similar in comparison to prior given presence of indistinct margins. Similar appearance of a near anechoic mass of the superior pole measuring 12 x 10 x 9 mm. Additional previously described favored cyst is not discretely visualized on today's exam.   Left Kidney:   Renal measurements: 10.2 x 5.5 x 4.9 cm = volume: 141 mL. Echogenicity within normal limits. No hydronephrosis visualized. Benign cysts are noted measuring up to 19 mm (for which no dedicated imaging follow-up is recommended).    Bladder:   Appears normal for degree of bladder distention.   Other:   None.   IMPRESSION: 1. Similar appearance of a 4.9 cm echogenic mass of the inferior pole of the RIGHT kidney. This is favored to reflect an angiomyolipoma. 2. Similar appearance of a 12 mm near anechoic mass of the superior pole of the RIGHT kidney. This is favored to reflect a mildly complicated cyst.     Electronically Signed   By: Corean Salter M.D.   On: 06/27/2023 22:00  Labs:  CBC: No results for input(s): WBC, HGB, HCT, PLT in the last 8760 hours.  COAGS: No results for input(s): INR, APTT in the last 8760 hours.  BMP: No results  for input(s): NA, K, CL, CO2, GLUCOSE, BUN, CALCIUM, CREATININE, GFRNONAA, GFRAA in the last 8760 hours.  Invalid input(s): CMP  LIVER FUNCTION TESTS: No results for input(s): BILITOT, AST, ALT, ALKPHOS, PROT, ALBUMIN  in the last 8760 hours.  TUMOR MARKERS: No results for input(s): AFPTM, CEA, CA199, CHROMGRNA in the last 8760 hours.  Assessment and Plan:  78 year old female with an asymptomatic angiomyolipoma involving the right kidney lower pole.  Last cross-sectional imaging was in 2023.  The previous CT demonstrated a fat attenuating lesion that is exophytic and measures up to 3.8 cm in the craniocaudal dimension.  Most recent renal ultrasound demonstrates a hyperechoic lesion along the right kidney lower pole with a maximum dimension is 4.9 cm.  Findings are suggestive for an enlarging angiomyolipoma.    We discussed the benign nature of a renal angiomyolipoma but the risks associated with angiomyolipomas particularly when they are larger than 4 cm.  Patient understands that her tumor is at risk for a traumatic or spontaneous hemorrhage.  We discussed continued surveillance versus surgery versus embolization.  At this point, the patient would like to have the lesion treated based on its size.  Based on  the previous cross-sectional imaging, the lesion appears to be amenable for transcatheter embolization, predominantly using bland embolization but possibly using some coils.   We discussed renal angiography and selective angiography of the vessels feeding the tumor.  We discussed the risks of embolization including bleeding, infection, incomplete treatment, nontarget embolization and need for re-treatment.  I explained that the goal of the embolization was to infarct the tumor, decrease the size of the tumor and decrease the risk of hemorrhage.  Patient understands she will need imaging surveillance after the embolization.  We discussed postprocedural expectations including postembolization pain and limitations after an arterial groin puncture. After a long discussion about the risks and benefits of the renal tumor embolization, the patient decided that she would like to proceed with the embolization procedure.  Since the patient's last imaging was in March 2025, we will get updated imaging with a CTA of the abdomen to evaluate the renal vasculature and to better assess the true size of the angiomyolipoma.  After the CTA procedure, we will plan for renal arteriography and right renal lesion embolization using moderate sedation.    Plan for an outpatient procedure unless the patient has significant postembolization pain.  Thank you for this interesting consult.  I greatly enjoyed meeting Tracie Brooks and look forward to participating in their care.  A copy of this report was sent to the requesting provider on this date.  Electronically Signed: Juliene JONELLE Balder 02/15/2024, 5:17 PM   I spent a total of  30 Minutes   in face to face in clinical consultation, greater than 50% of which was counseling/coordinating care for management of a right renal angiomyolipoma.

## 2024-02-16 ENCOUNTER — Other Ambulatory Visit: Payer: Self-pay | Admitting: Diagnostic Radiology

## 2024-02-16 DIAGNOSIS — D1771 Benign lipomatous neoplasm of kidney: Secondary | ICD-10-CM

## 2024-02-19 ENCOUNTER — Encounter: Payer: Self-pay | Admitting: Diagnostic Radiology

## 2024-02-22 ENCOUNTER — Other Ambulatory Visit: Payer: Self-pay | Admitting: Diagnostic Radiology

## 2024-02-22 DIAGNOSIS — D1771 Benign lipomatous neoplasm of kidney: Secondary | ICD-10-CM

## 2024-02-24 ENCOUNTER — Ambulatory Visit
Admission: RE | Admit: 2024-02-24 | Discharge: 2024-02-24 | Disposition: A | Discharge status: Home / Self Care | Source: Ambulatory Visit | Attending: Diagnostic Radiology | Admitting: Diagnostic Radiology

## 2024-02-24 ENCOUNTER — Encounter: Payer: Self-pay | Admitting: Radiology

## 2024-02-24 DIAGNOSIS — D1771 Benign lipomatous neoplasm of kidney: Secondary | ICD-10-CM

## 2024-02-24 DIAGNOSIS — N281 Cyst of kidney, acquired: Secondary | ICD-10-CM | POA: Diagnosis not present

## 2024-02-24 MED ORDER — IOPAMIDOL (ISOVUE-370) INJECTION 76%
75.0000 mL | Freq: Once | INTRAVENOUS | Status: AC | PRN
  Administered 2024-02-24: 75 mL via INTRAVENOUS

## 2024-03-10 ENCOUNTER — Other Ambulatory Visit: Payer: Self-pay | Admitting: Physician Assistant

## 2024-03-10 ENCOUNTER — Other Ambulatory Visit: Payer: Self-pay

## 2024-03-10 DIAGNOSIS — Z01818 Encounter for other preprocedural examination: Secondary | ICD-10-CM

## 2024-03-11 ENCOUNTER — Other Ambulatory Visit: Payer: Self-pay | Admitting: Diagnostic Radiology

## 2024-03-11 ENCOUNTER — Encounter (HOSPITAL_COMMUNITY): Payer: Self-pay | Admitting: *Deleted

## 2024-03-11 ENCOUNTER — Ambulatory Visit (HOSPITAL_COMMUNITY)
Admission: RE | Admit: 2024-03-11 | Discharge: 2024-03-11 | Disposition: A | Source: Ambulatory Visit | Attending: Diagnostic Radiology | Admitting: Diagnostic Radiology

## 2024-03-11 ENCOUNTER — Ambulatory Visit (HOSPITAL_COMMUNITY)
Admission: RE | Admit: 2024-03-11 | Discharge: 2024-03-11 | Disposition: A | Source: Ambulatory Visit | Attending: Diagnostic Radiology

## 2024-03-11 DIAGNOSIS — D1771 Benign lipomatous neoplasm of kidney: Secondary | ICD-10-CM

## 2024-03-11 DIAGNOSIS — Z7722 Contact with and (suspected) exposure to environmental tobacco smoke (acute) (chronic): Secondary | ICD-10-CM | POA: Diagnosis not present

## 2024-03-11 DIAGNOSIS — Z01818 Encounter for other preprocedural examination: Secondary | ICD-10-CM

## 2024-03-11 HISTORY — PX: IR RENAL SELECTIVE  UNI INC S&I MOD SED: IMG654

## 2024-03-11 HISTORY — PX: IR EMBO TUMOR ORGAN ISCHEMIA INFARCT INC GUIDE ROADMAPPING: IMG5449

## 2024-03-11 HISTORY — PX: IR 3D INDEPENDENT WKST: IMG2385

## 2024-03-11 HISTORY — PX: IR US GUIDE VASC ACCESS RIGHT: IMG2390

## 2024-03-11 HISTORY — PX: IR RENAL SUPRASEL UNI S&I MOD SED: IMG655

## 2024-03-11 LAB — CBC
HCT: 35.8 % — ABNORMAL LOW (ref 36.0–46.0)
Hemoglobin: 11 g/dL — ABNORMAL LOW (ref 12.0–15.0)
MCH: 27 pg (ref 26.0–34.0)
MCHC: 30.7 g/dL (ref 30.0–36.0)
MCV: 88 fL (ref 80.0–100.0)
Platelets: 227 K/uL (ref 150–400)
RBC: 4.07 MIL/uL (ref 3.87–5.11)
RDW: 13.9 % (ref 11.5–15.5)
WBC: 6.3 K/uL (ref 4.0–10.5)
nRBC: 0 % (ref 0.0–0.2)

## 2024-03-11 LAB — BASIC METABOLIC PANEL WITH GFR
Anion gap: 12 (ref 5–15)
BUN: 36 mg/dL — ABNORMAL HIGH (ref 8–23)
CO2: 19 mmol/L — ABNORMAL LOW (ref 22–32)
Calcium: 11 mg/dL — ABNORMAL HIGH (ref 8.9–10.3)
Chloride: 107 mmol/L (ref 98–111)
Creatinine, Ser: 1.13 mg/dL — ABNORMAL HIGH (ref 0.44–1.00)
GFR, Estimated: 50 mL/min — ABNORMAL LOW (ref >60)
Glucose, Bld: 109 mg/dL — ABNORMAL HIGH (ref 70–99)
Potassium: 4 mmol/L (ref 3.5–5.1)
Sodium: 139 mmol/L (ref 135–145)

## 2024-03-11 LAB — PROTIME-INR
INR: 0.9 (ref 0.8–1.2)
Prothrombin Time: 12.8 s (ref 11.4–15.2)

## 2024-03-11 LAB — TYPE AND SCREEN
ABO/RH(D): A POS
Antibody Screen: NEGATIVE

## 2024-03-11 MED ORDER — FENTANYL CITRATE (PF) 100 MCG/2ML IJ SOLN
INTRAMUSCULAR | Status: AC | PRN
  Administered 2024-03-11: 25 ug via INTRAVENOUS

## 2024-03-11 MED ORDER — MIDAZOLAM HCL 2 MG/2ML IJ SOLN
INTRAMUSCULAR | Status: AC
  Filled 2024-03-11: qty 2

## 2024-03-11 MED ORDER — IOHEXOL 300 MG/ML  SOLN
100.0000 mL | Freq: Once | INTRAMUSCULAR | Status: AC | PRN
  Administered 2024-03-11: 25 mL via INTRA_ARTERIAL

## 2024-03-11 MED ORDER — FENTANYL CITRATE (PF) 100 MCG/2ML IJ SOLN
INTRAMUSCULAR | Status: AC | PRN
  Administered 2024-03-11: 50 ug via INTRAVENOUS

## 2024-03-11 MED ORDER — MIDAZOLAM HCL (PF) 2 MG/2ML IJ SOLN
INTRAMUSCULAR | Status: AC | PRN
  Administered 2024-03-11: .5 mg via INTRAVENOUS

## 2024-03-11 MED ORDER — SODIUM CHLORIDE 0.9 % IV SOLN: INTRAVENOUS | Status: DC

## 2024-03-11 MED ORDER — LIDOCAINE HCL 1 % IJ SOLN
20.0000 mL | Freq: Once | INTRAMUSCULAR | Status: AC
  Administered 2024-03-11: 6 mL via INTRADERMAL

## 2024-03-11 MED ORDER — FENTANYL CITRATE (PF) 100 MCG/2ML IJ SOLN
INTRAMUSCULAR | Status: AC
  Filled 2024-03-11: qty 2

## 2024-03-11 MED ORDER — ACETAMINOPHEN 500 MG PO TABS: 500.0000 mg | ORAL_TABLET | Freq: Four times a day (QID) | ORAL | Status: DC | PRN

## 2024-03-11 MED ORDER — LIDOCAINE HCL 1 % IJ SOLN
INTRAMUSCULAR | Status: AC
  Filled 2024-03-11: qty 20

## 2024-03-11 MED ORDER — IOHEXOL 300 MG/ML  SOLN
100.0000 mL | Freq: Once | INTRAMUSCULAR | Status: AC | PRN
  Administered 2024-03-11: 60 mL via INTRA_ARTERIAL

## 2024-03-11 MED ORDER — MIDAZOLAM HCL (PF) 2 MG/2ML IJ SOLN
INTRAMUSCULAR | Status: AC | PRN
  Administered 2024-03-11: 1 mg via INTRAVENOUS

## 2024-03-11 NOTE — Discharge Instructions (Signed)
 CONTACT INTERVENTIONAL RADIOLOGY CLINIC AT [phone] WITH ANY QUESTIONS OR CONCERNS  MAY REMOVED DRESSING AND SHOWER TOMORROW

## 2024-03-11 NOTE — H&P (Signed)
 Chief Complaint: right renal angiomyolipoma  Referring Provider(s): Sherrilee Belvie  Supervising Physician: Philip Cornet  Patient Status: [facility] - Out-pt  History of Present Illness: Tracie Brooks is a 78 y.o. female with medical history significant for hypertension, hyperlipidemia, and irritable bowel syndrome. 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 She presents today for image guided right renal angiogram and embolization.   Confirms NPO since midnight and ride/supervision available for 24 hours.  Denies fatigue, fever, chills, shortness of breath, chest pain, sore throat, nausea/vomiting/diarrhea, abdominal pain, blood in stool or urine, abnormal bruising, leg swelling, back pain.   Allergies Reviewed:  Beta adrenergic blockers, Levsin [hyoscyamine sulfate], Neurontin  [gabapentin ], Pravastatin, Sulfa antibiotics, Vicodin [hydrocodone -acetaminophen ], Zocor [simvastatin], Ibuprofen, and Lidocaine   Patient is Full Code  Past Medical History:  Diagnosis Date   Allergy     Asthma    Chest pain    Dyslipidemia    Hypertension    IBS (irritable bowel syndrome)    Murmur     Past Surgical History:  Procedure Laterality Date   ABDOMINAL HYSTERECTOMY     BIOPSY BREAST  1985   COLONOSCOPY     COLONOSCOPY N/A 09/03/2015   Procedure: COLONOSCOPY;  Surgeon: Margo LITTIE Haddock, MD;  Location: [facility] SUITE;  Service: Endoscopy;  Laterality: N/A;  1400   IR RADIOLOGIST EVAL & MGMT  02/15/2024   KNEE ARTHROSCOPY     right 1982   TOTAL HIP ARTHROPLASTY Right 05/28/2018   Procedure: TOTAL HIP ARTHROPLASTY ANTERIOR APPROACH;  Surgeon: Beverley Evalene BIRCH, MD;  Location: [facility] OR;  Service: Orthopedics;  Laterality: Right;      Medications: Prior to Admission medications  Medication Sig Start Date End Date Taking? Authorizing Provider  ARMOUR THYROID  30 MG tablet Take 30 mg  by mouth daily. 10/31/21  Yes [provider]  b complex vitamins capsule Take 1 capsule by mouth daily.   Yes [provider]  hydrochlorothiazide  (HYDRODIURIL ) 12.5 MG tablet Take 12.5 mg by mouth daily. 05/09/20  Yes [provider]  losartan (COZAAR) 50 MG tablet Take 50 mg by mouth daily.   Yes [provider]  OVER THE COUNTER MEDICATION Pure NAC Bid with meals   Yes [provider]  OVER THE COUNTER MEDICATION D3 K2 once per day   Yes [provider]  OVER THE COUNTER MEDICATION Collagen Powder once daily   Yes [provider]  PROGESTERONE  PO Take by mouth.   Yes [provider]  budesonide-formoterol (SYMBICORT) 160-4.5 MCG/ACT inhaler Inhale 2 puffs into the lungs 2 (two) times daily.    [provider]  lisinopril (ZESTRIL) 10 MG tablet Take 1 tablet by mouth daily. Patient not taking: Reported on 01/13/2024 06/24/20   [provider]     Family History  Problem Relation Age of Onset   Hypertension Mother 64   Hyperlipidemia Mother    Arthritis Mother    Hypertension Father 70   Heart disease Father        Pacemaker   Dementia Father    Heart disease Sister    Congestive Heart Failure Brother    Breast cancer Sister    Colon polyps Neg Hx    Colon cancer Neg Hx     Social History   Socioeconomic History   Marital status: Married    Spouse name: Aliene   Number of  children: 1   Years of education: Not on file   Highest education level: Master's degree (e.g., MA, MS, MEng, MEd, MSW, MBA)  Occupational History   Occupation: Controller/HR  Tobacco Use   Smoking status: Never    Passive exposure: Past   Smokeless tobacco: Never  Vaping Use   Vaping status: Never Used  Substance and Sexual Activity   Alcohol use: No   Drug use: No   Sexual activity: Not Currently  Other Topics Concern   Not on file  Social History Narrative   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    No regular exercise      Patient is right-handed. She lives with her husband in a 2 level home. She walks most days.   Social Drivers of Health   Tobacco Use: Low Risk (03/11/2024)   Patient History    Smoking Tobacco Use: Never    Smokeless Tobacco Use: Never    Passive Exposure: Past  Financial Resource Strain: Not on file  Food Insecurity: Not on file  Transportation Needs: Not on file  Physical Activity: Not on file  Stress: Not on file  Social Connections: Not on file  Depression (EYV7-0): Not on file  Alcohol Screen: Not on file  Housing: Not on file  Utilities: Not on file  Health Literacy: Not on file     Review of Systems: A 12 point ROS discussed and pertinent positives are indicated in the HPI above.  All other systems are negative.    Vital Signs: BP (!) 133/54 (BP Location: Right Arm)   Pulse 65   Temp 97.7 F (36.5 C) (Oral)   Resp 16   Ht 5' 6 (1.676 m)   Wt 190 lb (86.2 kg)   SpO2 96%   BMI 30.67 kg/m   Advance Care Plan: no documents on file    Physical Exam HENT:     Mouth/Throat:     Mouth: Mucous membranes are moist.  Cardiovascular:     Rate and Rhythm: Normal rate and regular rhythm.  Pulmonary:     Effort: Pulmonary effort is normal. No respiratory distress.     Breath sounds: Normal breath sounds.  Abdominal:     General: Bowel sounds are normal.     Palpations: Abdomen is soft.     Tenderness: There is no abdominal tenderness. There is no right CVA tenderness or left CVA tenderness.  Skin:    General: Skin is warm and dry.  Neurological:     Mental Status: She is alert and oriented to person, place, and time.  Psychiatric:        Mood and Affect: Mood normal.        Behavior: Behavior normal.     Imaging: CT ANGIO ABDOMEN W &/OR WO CONTRAST Result Date: 03/05/2024 CLINICAL DATA:  Evaluate right renal angiomyolipoma prior to endovascular treatment. EXAM: CT ANGIOGRAPHY ABDOMEN TECHNIQUE: Multidetector CT imaging of the  abdomen was performed using the standard protocol during bolus administration of intravenous contrast. Multiplanar reconstructed images and MIPs were obtained and reviewed to evaluate the vascular anatomy. RADIATION DOSE REDUCTION: This exam was performed according to the departmental dose-optimization program which includes automated exposure control, adjustment of the mA and/or kV according to patient size and/or use of iterative reconstruction technique. CONTRAST:  75mL ISOVUE -370 IOPAMIDOL  (ISOVUE -370) INJECTION 76% COMPARISON:  01/16/2022 and 11/17/2016 FINDINGS: VASCULAR Aorta: Atherosclerotic disease in the abdominal aorta without aneurysm, dissection or significant stenosis. Celiac: Mild narrowing in the proximal celiac trunk likely  related to celiac artery compression. Left gastric artery, splenic artery and common hepatic artery are patent. No evidence for aneurysm or dissection involving the celiac artery or branches. SMA: Patent without evidence of aneurysm, dissection, vasculitis or significant stenosis. Renals: Single bilateral renal arteries. No evidence for aneurysm, dissection or significant stenosis involving the renal arteries. Specifically, there is no gross vascular abnormality involving the angiomyolipoma in the right kidney lower pole IMA: Proximal IMA is patent. Veins: Hepatic veins and main portal veins are patent. Retrocaval left renal vein is a normal variant. No gross abnormality to the IVC. Review of the MIP images confirms the above findings. NON-VASCULAR Lower chest: 4 mm nodular density in the right lower lobe near the right major fissure on image 12, sequence 4 is stable since 2018 and compatible with a benign nodule. Punctate nodule in the right middle lobe on image 2, sequence 4 is also stable since 2018. Stable punctate nodule along the left major fissure on image 8 of sequence 4. No acute abnormality at the lung bases. Hepatobiliary: Normal appearance of the liver. No acute  abnormality to the gallbladder. Probable small gallstone. No biliary dilatation. Pancreas: Unremarkable. No pancreatic ductal dilatation or surrounding inflammatory changes. Spleen: Normal in size without focal abnormality. Adrenals/Urinary Tract: Normal adrenal glands. Bilateral low-density renal cysts that do not require dedicated follow-up. Again noted is an exophytic fat containing lesion involving the right kidney lower pole and compatible with an angiomyolipoma. Lesion measures 4.1 x 3.3 x 3.5 cm and previously measured 3.8 x 3.0 x 3.4 in 2023. Morphology of the lesion has not significantly changed with small blood vessels within the lesion. There are no aneurysm formations within the lesion. Mild perinephric stranding around the right kidney. No evidence for blood products or hemorrhage. Mild fullness in the renal pelvis bilaterally but no hydronephrosis. Stomach/Bowel: No acute abnormality involving the visualized bowel structures. Lymphatic: No significant lymph node enlargement in the abdomen. Other: Negative for ascites.  Tiny umbilical hernia containing fat. Musculoskeletal: Degenerative disc and endplate changes in the lumbar spine. IMPRESSION: VASCULAR 1. Single bilateral renal arteries. No evidence for aneurysm or dissection involving the renal arteries. No gross vascular abnormalities involving the right renal angiomyolipoma. 2.  Aortic Atherosclerosis (ICD10-I70.0). NON-VASCULAR 1. Exophytic fat containing lesion involving the right kidney lower pole. This lesion is compatible with an angiomyolipoma. Lesion has slightly enlarged in size since 2023 measuring up to 4.1 cm and previously measured up to 3.8 cm. 2. Bilateral renal cysts. Electronically Signed   By: Juliene Balder M.D.   On: 03/05/2024 09:30   IR Radiologist Eval & Mgmt Result Date: 02/15/2024 : See note in Epic. Electronically Signed   By: Juliene Balder M.D.   On: 02/15/2024 17:43    Labs:  CBC: Recent Labs    03/11/24 0834  WBC  6.3  HGB 11.0*  HCT 35.8*  PLT 227    COAGS: No results for input(s): INR, APTT in the last 8760 hours.  BMP: No results for input(s): NA, K, CL, CO2, GLUCOSE, BUN, CALCIUM, CREATININE, GFRNONAA, GFRAA in the last 8760 hours.  Invalid input(s): CMP  LIVER FUNCTION TESTS: No results for input(s): BILITOT, AST, ALT, ALKPHOS, PROT, ALBUMIN  in the last 8760 hours.  TUMOR MARKERS: No results for input(s): AFPTM, CEA, CA199, CHROMGRNA in the last 8760 hours.   Assessment and Plan: Right renal angiomyolipoma-  Request for image guided right renal angiogram and embolization.  No contraindications for procedure identified in ROS, physical exam, or review  of pre-sedation considerations.  Labs reviewed and within acceptable range CBC, BMP, INR ordered and pending - will review prior to start of procedure Imaging available and reviewed VSS, afebrile Abx not indicated  The risks and benefits of embolization were discussed with the patient including, but not limited to bleeding, infection, vascular injury, post operative pain, or contrast induced renal failure.  This procedure involves the use of X-rays and because of the nature of the planned procedure, it is possible that we will have prolonged use of X-ray fluoroscopy.  Potential radiation risks to you include (but are not limited to) the following: - A slightly elevated risk for cancer several years later in life. This risk is typically less than 0.5% percent. This risk is low in comparison to the normal incidence of human cancer, which is 33% for women and 50% for men according to the [employer] Society. - Radiation induced injury can include skin redness, resembling a rash, tissue breakdown / ulcers and hair loss (which can be temporary or permanent).   The likelihood of either of these occurring depends on the difficulty of the procedure and whether you are sensitive to radiation  due to previous procedures, disease, or genetic conditions.   IF your procedure requires a prolonged use of radiation, you will be notified and given written instructions for further action.  It is your responsibility to monitor the irradiated area for the 2 weeks following the procedure and to notify your physician if you are concerned that you have suffered a radiation induced injury.    All of the patient's questions were answered, patient is agreeable to proceed. Consent signed and in chart.    Thank you for allowing our service to participate in FATINA SPRANKLE 's care.    Electronically Signed: Nellie Chevalier B Avish Torry, NP   03/11/2024, 8:51 AM     I spent a total of 15 Minutes in face to face in clinical consultation, greater than 50% of which was counseling/coordinating care for image guided right renal angiogram and embolization for right renal angiomyolipoma.   (A copy of this note was sent to the referring provider and the time of visit.)

## 2024-03-11 NOTE — Sedation Documentation (Signed)
 RN McKenzie and RN Rosina pulled 4mg  Versed  and 200mcg Fentanyl  in IR room. Pt. Received 3mg  Versed  and 175mcg Fentanyl  throughout the procedure. RN McKenzie and RN Rosina wasted 1mg  Versed  and 25mcg Fentanyl  in IR pyxis.

## 2024-03-11 NOTE — Procedures (Signed)
 Interventional Radiology Procedure:   Indications: Right renal AML  Procedure: Right renal arteriogram and bland embolization of AML  Findings: AML identified in right kidney lower pole.  Feeding vessels treated with Embospheres (300-500 micron).   No significant flow to AML vascularity after bland embolization.   Complications: None     EBL: Minimal  Plan:  Bedrest 3 hours, then discharge to home.    Adolphe Fortunato R. Philip, MD  Pager: [phone]

## 2024-03-12 ENCOUNTER — Encounter (HOSPITAL_COMMUNITY): Payer: Self-pay | Admitting: Radiology

## 2024-03-29 ENCOUNTER — Encounter (HOSPITAL_COMMUNITY): Payer: Self-pay | Admitting: Radiology

## 2024-03-29 ENCOUNTER — Encounter: Payer: Self-pay | Admitting: Diagnostic Radiology

## 2024-03-30 ENCOUNTER — Other Ambulatory Visit: Payer: Self-pay | Admitting: Diagnostic Radiology

## 2024-03-30 DIAGNOSIS — D179 Benign lipomatous neoplasm, unspecified: Secondary | ICD-10-CM

## 2024-03-30 DIAGNOSIS — D1771 Benign lipomatous neoplasm of kidney: Secondary | ICD-10-CM

## 2024-04-01 ENCOUNTER — Other Ambulatory Visit (HOSPITAL_COMMUNITY)
Admission: RE | Admit: 2024-04-01 | Discharge: 2024-04-01 | Disposition: A | Discharge status: Home / Self Care | Source: Ambulatory Visit | Attending: Diagnostic Radiology | Admitting: Diagnostic Radiology

## 2024-04-01 DIAGNOSIS — D179 Benign lipomatous neoplasm, unspecified: Secondary | ICD-10-CM | POA: Diagnosis present

## 2024-04-01 LAB — BASIC METABOLIC PANEL WITH GFR
Anion gap: 13 (ref 5–15)
BUN: 30 mg/dL — ABNORMAL HIGH (ref 8–23)
CO2: 21 mmol/L — ABNORMAL LOW (ref 22–32)
Calcium: 10.6 mg/dL — ABNORMAL HIGH (ref 8.9–10.3)
Chloride: 107 mmol/L (ref 98–111)
Creatinine, Ser: 1.21 mg/dL — ABNORMAL HIGH (ref 0.44–1.00)
GFR, Estimated: 46 mL/min — ABNORMAL LOW
Glucose, Bld: 103 mg/dL — ABNORMAL HIGH (ref 70–99)
Potassium: 3.7 mmol/L (ref 3.5–5.1)
Sodium: 141 mmol/L (ref 135–145)

## 2024-04-08 ENCOUNTER — Ambulatory Visit
Admission: RE | Admit: 2024-04-08 | Discharge: 2024-04-08 | Disposition: A | Source: Ambulatory Visit | Attending: Diagnostic Radiology | Admitting: Diagnostic Radiology

## 2024-04-08 DIAGNOSIS — D1771 Benign lipomatous neoplasm of kidney: Secondary | ICD-10-CM

## 2024-04-08 HISTORY — PX: IR RADIOLOGIST EVAL & MGMT: IMG5224

## 2024-04-08 NOTE — Progress Notes (Signed)
 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  Referring Physician(s): Sherrilee Dover  History of Present Illness: MADLYNN LUNDEEN is a 79 y.o. 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  The lesion was being followed with serial imaging.  There was concern for interval enlargement of the angiomyolipoma and the patient was referred to interventional radiology for embolization.  CTA on 02/24/2024 demonstrated a 4.1 cm predominantly fat-containing lesion involving the right kidney lower pole.  The patient underwent right renal arteriography and selective angiography of the right kidney on 03/11/2024.  The lesion in the right kidney lower pole was treated with bland embolization.  Patient tolerated the procedure well and was discharged the same day as the procedure.  She denies flank pain, abdominal pain or hematuria.  She denies fevers or chills.  She has returned to her regular activities.  Patient is planning to move out of the state.  She recently saw her nephrologist and was told that everything is stable.  Creatinine on 04/01/2024 was 1.20 and it was 1.13 at the time of her embolization procedure.  Past Medical History:  Diagnosis Date   Allergy     Asthma    Chest pain    Dyslipidemia    Hypertension    IBS (irritable bowel syndrome)    Murmur     Past Surgical History:  Procedure Laterality Date   ABDOMINAL HYSTERECTOMY     BIOPSY BREAST  1985   COLONOSCOPY     COLONOSCOPY N/A 09/03/2015   Procedure: COLONOSCOPY;  Surgeon: Margo LITTIE Haddock, MD;  Location: [facility];  Service: Endoscopy;  Laterality: N/A;  1400   IR 3D INDEPENDENT WKST  03/11/2024   IR EMBO TUMOR ORGAN ISCHEMIA INFARCT INC GUIDE ROADMAPPING  03/11/2024   IR RADIOLOGIST EVAL & MGMT  02/15/2024   IR RENAL SELECTIVE  UNI INC S&I MOD SED  03/11/2024   IR US  GUIDE VASC ACCESS RIGHT  03/11/2024   KNEE  ARTHROSCOPY     right 1982   TOTAL HIP ARTHROPLASTY Right 05/28/2018   Procedure: TOTAL HIP ARTHROPLASTY ANTERIOR APPROACH;  Surgeon: Beverley Evalene BIRCH, MD;  Location: [facility];  Service: Orthopedics;  Laterality: Right;    Allergies: Beta adrenergic blockers, Levsin [hyoscyamine sulfate], Neurontin  [gabapentin ], Pravastatin, Sulfa antibiotics, Vicodin [hydrocodone -acetaminophen ], Zocor [simvastatin], Ibuprofen, and Lidocaine   Medications: Prior to Admission medications  Medication Sig Start Date End Date Taking? Authorizing Provider  ARMOUR THYROID  30 MG tablet Take 30 mg by mouth daily. 10/31/21   [provider]  b complex vitamins capsule Take 1 capsule by mouth daily.    [provider]  budesonide-formoterol (SYMBICORT) 160-4.5 MCG/ACT inhaler Inhale 2 puffs into the lungs 2 (two) times daily.    [provider]  hydrochlorothiazide  (HYDRODIURIL ) 12.5 MG tablet Take 12.5 mg by mouth daily. 05/09/20   [provider]  lisinopril (ZESTRIL) 10 MG tablet Take 1 tablet by mouth daily. Patient not taking: Reported on 01/13/2024 06/24/20   [provider]  losartan (COZAAR) 50 MG tablet Take 50 mg by mouth daily.    [provider]  OVER THE COUNTER MEDICATION Pure NAC Bid with meals    [provider]  OVER THE COUNTER MEDICATION D3 K2 once per day    [provider]  OVER THE COUNTER MEDICATION Collagen Powder once daily    [provider]  PROGESTERONE  PO  Take by mouth.    [provider]     Family History  Problem Relation Age of Onset   Hypertension Mother 66   Hyperlipidemia Mother    Arthritis Mother    Hypertension Father 69   Heart disease Father        Pacemaker   Dementia Father    Heart disease Sister    Congestive Heart Failure Brother    Breast cancer Sister    Colon polyps Neg Hx    Colon cancer Neg Hx     Social History   Socioeconomic History   Marital status: Married     Spouse name: Aliene   Number of children: 1   Years of education: Not on file   Highest education level: Master's degree (e.g., MA, MS, MEng, MEd, MSW, MBA)  Occupational History   Occupation: Controller/HR  Tobacco Use   Smoking status: Never    Passive exposure: Past   Smokeless tobacco: Never  Vaping Use   Vaping status: Never Used  Substance and Sexual Activity   Alcohol use: No   Drug use: No   Sexual activity: Not Currently  Other Topics Concern   Not on file  Social History Narrative   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   No regular exercise      Patient is right-handed. She lives with her husband in a 2 level home. She walks most days.   Social Drivers of Health   Tobacco Use: Low Risk (03/11/2024)   Patient History    Smoking Tobacco Use: Never    Smokeless Tobacco Use: Never    Passive Exposure: Past  Financial Resource Strain: Not on file  Food Insecurity: Not on file  Transportation Needs: Not on file  Physical Activity: Not on file  Stress: Not on file  Social Connections: Not on file  Depression (EYV7-0): Not on file  Alcohol Screen: Not on file  Housing: Not on file  Utilities: Not on file  Health Literacy: Not on file     Review of Systems  Constitutional: Negative.   Respiratory: Negative.    Cardiovascular: Negative.   Gastrointestinal: Negative.   Genitourinary: Negative.     Vital Signs: BP 138/73 (BP Location: Left Arm, Patient Position: Sitting, Cuff Size: Normal)   Pulse 65   Temp 97.6 F (36.4 C) (Oral)   Resp 18   SpO2 97%     Physical Exam Constitutional:      Appearance: She is not ill-appearing.  Cardiovascular:     Rate and Rhythm: Normal rate.  Pulmonary:     Effort: Pulmonary effort is normal.  Abdominal:     General: Abdomen is flat.     Palpations: Abdomen is soft.     Comments: Right groin site is well-healed.  No hematoma.    Neurological:     Mental Status: She is alert.       Imaging: IR  EMBO TUMOR ORGAN ISCHEMIA INFARCT INC GUIDE ROADMAPPING Result Date: 03/11/2024 INDICATION: 79 year old with an enlarging right renal angiomyolipoma. Patient presents for a prophylactic embolization. EXAM: 1. Right renal arteriography 2. Selective right renal arteriography in the lower pole with cone beam CT 3. Bland embolization of right renal artery branches in the lower pole supplying the renal lesion 4. Follow-up renal angiography after embolization 5. Ultrasound guidance for vascular access MEDICATIONS: Moderate sedation ANESTHESIA/SEDATION: Moderate (conscious) sedation was employed during this procedure. A total of Versed  3 mg and Fentanyl  175 mcg was administered intravenously by the  radiology nurse. Total intra-service moderate Sedation Time: 76 minutes. The patient's level of consciousness and vital signs were monitored continuously by radiology nursing throughout the procedure under my direct supervision. CONTRAST:  85 mL Omnipaque  300 FLUOROSCOPY: Radiation Exposure Index (as provided by the fluoroscopic device): 819 mGy Kerma COMPLICATIONS: None immediate. PROCEDURE: Informed consent was obtained from the patient following explanation of the procedure, risks, benefits and alternatives. The patient understands, agrees and consents for the procedure. All questions were addressed. A time out was performed prior to the initiation of the procedure. Patient was placed supine on the interventional table. Right groin was prepped and draped in sterile fashion. Maximal barrier sterile technique was utilized including caps, mask, sterile gowns, sterile gloves, sterile drape, hand hygiene and skin antiseptic. Ultrasound confirmed a patent right common femoral artery. Ultrasound image was saved for documentation. Right groin was anesthetized with 1% lidocaine . A small incision was made. Using ultrasound guidance, 21 gauge needle was directed into the right common femoral artery and a micropuncture dilator set was  placed. Five French vascular sheath was placed over a Bentson wire. Soft catheter was used to cannulate the right renal artery. Right renal arteriogram was performed. The lesion in the lateral right kidney lower pole was identified. A 2.8 Progreat microcatheter was advanced into the lower pole segmental artery and arteriography was performed. Microcatheter was advanced into the feeding vessel supplying the angiomyolipoma and dedicated angiography was performed including a cone beam CT. Bland embolization was performed at this level using 300-500 micron Embospheres. Very small amount of particles needed until there was stagnant flow and reflux in the feeding vessel. Follow-up angiography demonstrates significant pruning of the vascularity following particle embolization. Microcatheter was pulled back and additional angiography was performed. Microcatheter was removed and final angiogram was performed through the Sos catheter. Sos catheter was removed over a wire. Oblique angiography was performed through the right groin sheath. Right groin sheath was removed using an Angio-Seal closure device. Right groin hemostasis at the end of the procedure. Bandage placed over the puncture site. FINDINGS: Right renal arteriography demonstrates a small hypervascular lesion in the lower pole corresponding with the angiomyolipoma on previous CT imaging. Selective angiography and cone beam CT confirmed blood supply to the angiomyolipoma. Following particle embolization of the feeding artery, there was significant pruning of the vessels supplying the angiomyolipoma. Final angiography confirms flow to the main renal arteries and no significant filling of the angiomyolipoma. IMPRESSION: 1. Successful bland embolization of the right renal angiomyolipoma. Electronically Signed   By: Juliene Balder M.D.   On: 03/11/2024 12:29   IR US  Guide Vasc Access Right Result Date: 03/11/2024 INDICATION: 79 year old with an enlarging right renal  angiomyolipoma. Patient presents for a prophylactic embolization. EXAM: 1. Right renal arteriography 2. Selective right renal arteriography in the lower pole with cone beam CT 3. Bland embolization of right renal artery branches in the lower pole supplying the renal lesion 4. Follow-up renal angiography after embolization 5. Ultrasound guidance for vascular access MEDICATIONS: Moderate sedation ANESTHESIA/SEDATION: Moderate (conscious) sedation was employed during this procedure. A total of Versed  3 mg and Fentanyl  175 mcg was administered intravenously by the radiology nurse. Total intra-service moderate Sedation Time: 76 minutes. The patient's level of consciousness and vital signs were monitored continuously by radiology nursing throughout the procedure under my direct supervision. CONTRAST:  85 mL Omnipaque  300 FLUOROSCOPY: Radiation Exposure Index (as provided by the fluoroscopic device): 819 mGy Kerma COMPLICATIONS: None immediate. PROCEDURE: Informed consent was obtained from the  patient following explanation of the procedure, risks, benefits and alternatives. The patient understands, agrees and consents for the procedure. All questions were addressed. A time out was performed prior to the initiation of the procedure. Patient was placed supine on the interventional table. Right groin was prepped and draped in sterile fashion. Maximal barrier sterile technique was utilized including caps, mask, sterile gowns, sterile gloves, sterile drape, hand hygiene and skin antiseptic. Ultrasound confirmed a patent right common femoral artery. Ultrasound image was saved for documentation. Right groin was anesthetized with 1% lidocaine . A small incision was made. Using ultrasound guidance, 21 gauge needle was directed into the right common femoral artery and a micropuncture dilator set was placed. Five French vascular sheath was placed over a Bentson wire. Soft catheter was used to cannulate the right renal artery. Right  renal arteriogram was performed. The lesion in the lateral right kidney lower pole was identified. A 2.8 Progreat microcatheter was advanced into the lower pole segmental artery and arteriography was performed. Microcatheter was advanced into the feeding vessel supplying the angiomyolipoma and dedicated angiography was performed including a cone beam CT. Bland embolization was performed at this level using 300-500 micron Embospheres. Very small amount of particles needed until there was stagnant flow and reflux in the feeding vessel. Follow-up angiography demonstrates significant pruning of the vascularity following particle embolization. Microcatheter was pulled back and additional angiography was performed. Microcatheter was removed and final angiogram was performed through the Sos catheter. Sos catheter was removed over a wire. Oblique angiography was performed through the right groin sheath. Right groin sheath was removed using an Angio-Seal closure device. Right groin hemostasis at the end of the procedure. Bandage placed over the puncture site. FINDINGS: Right renal arteriography demonstrates a small hypervascular lesion in the lower pole corresponding with the angiomyolipoma on previous CT imaging. Selective angiography and cone beam CT confirmed blood supply to the angiomyolipoma. Following particle embolization of the feeding artery, there was significant pruning of the vessels supplying the angiomyolipoma. Final angiography confirms flow to the main renal arteries and no significant filling of the angiomyolipoma. IMPRESSION: 1. Successful bland embolization of the right renal angiomyolipoma. Electronically Signed   By: Juliene Balder M.D.   On: 03/11/2024 12:29   IR 3D Independent [place] Result Date: 03/11/2024 INDICATION: 79 year old with an enlarging right renal angiomyolipoma. Patient presents for a prophylactic embolization. EXAM: 1. Right renal arteriography 2. Selective right renal arteriography in the  lower pole with cone beam CT 3. Bland embolization of right renal artery branches in the lower pole supplying the renal lesion 4. Follow-up renal angiography after embolization 5. Ultrasound guidance for vascular access MEDICATIONS: Moderate sedation ANESTHESIA/SEDATION: Moderate (conscious) sedation was employed during this procedure. A total of Versed  3 mg and Fentanyl  175 mcg was administered intravenously by the radiology nurse. Total intra-service moderate Sedation Time: 76 minutes. The patient's level of consciousness and vital signs were monitored continuously by radiology nursing throughout the procedure under my direct supervision. CONTRAST:  85 mL Omnipaque  300 FLUOROSCOPY: Radiation Exposure Index (as provided by the fluoroscopic device): 819 mGy Kerma COMPLICATIONS: None immediate. PROCEDURE: Informed consent was obtained from the patient following explanation of the procedure, risks, benefits and alternatives. The patient understands, agrees and consents for the procedure. All questions were addressed. A time out was performed prior to the initiation of the procedure. Patient was placed supine on the interventional table. Right groin was prepped and draped in sterile fashion. Maximal barrier sterile technique was utilized including caps, mask,  sterile gowns, sterile gloves, sterile drape, hand hygiene and skin antiseptic. Ultrasound confirmed a patent right common femoral artery. Ultrasound image was saved for documentation. Right groin was anesthetized with 1% lidocaine . A small incision was made. Using ultrasound guidance, 21 gauge needle was directed into the right common femoral artery and a micropuncture dilator set was placed. Five French vascular sheath was placed over a Bentson wire. Soft catheter was used to cannulate the right renal artery. Right renal arteriogram was performed. The lesion in the lateral right kidney lower pole was identified. A 2.8 Progreat microcatheter was advanced into the  lower pole segmental artery and arteriography was performed. Microcatheter was advanced into the feeding vessel supplying the angiomyolipoma and dedicated angiography was performed including a cone beam CT. Bland embolization was performed at this level using 300-500 micron Embospheres. Very small amount of particles needed until there was stagnant flow and reflux in the feeding vessel. Follow-up angiography demonstrates significant pruning of the vascularity following particle embolization. Microcatheter was pulled back and additional angiography was performed. Microcatheter was removed and final angiogram was performed through the Sos catheter. Sos catheter was removed over a wire. Oblique angiography was performed through the right groin sheath. Right groin sheath was removed using an Angio-Seal closure device. Right groin hemostasis at the end of the procedure. Bandage placed over the puncture site. FINDINGS: Right renal arteriography demonstrates a small hypervascular lesion in the lower pole corresponding with the angiomyolipoma on previous CT imaging. Selective angiography and cone beam CT confirmed blood supply to the angiomyolipoma. Following particle embolization of the feeding artery, there was significant pruning of the vessels supplying the angiomyolipoma. Final angiography confirms flow to the main renal arteries and no significant filling of the angiomyolipoma. IMPRESSION: 1. Successful bland embolization of the right renal angiomyolipoma. Electronically Signed   By: Juliene Balder M.D.   On: 03/11/2024 12:29   IR Angiogram Renal Left Selective Result Date: 03/11/2024 INDICATION: 79 year old with an enlarging right renal angiomyolipoma. Patient presents for a prophylactic embolization. EXAM: 1. Right renal arteriography 2. Selective right renal arteriography in the lower pole with cone beam CT 3. Bland embolization of right renal artery branches in the lower pole supplying the renal lesion 4.  Follow-up renal angiography after embolization 5. Ultrasound guidance for vascular access MEDICATIONS: Moderate sedation ANESTHESIA/SEDATION: Moderate (conscious) sedation was employed during this procedure. A total of Versed  3 mg and Fentanyl  175 mcg was administered intravenously by the radiology nurse. Total intra-service moderate Sedation Time: 76 minutes. The patient's level of consciousness and vital signs were monitored continuously by radiology nursing throughout the procedure under my direct supervision. CONTRAST:  85 mL Omnipaque  300 FLUOROSCOPY: Radiation Exposure Index (as provided by the fluoroscopic device): 819 mGy Kerma COMPLICATIONS: None immediate. PROCEDURE: Informed consent was obtained from the patient following explanation of the procedure, risks, benefits and alternatives. The patient understands, agrees and consents for the procedure. All questions were addressed. A time out was performed prior to the initiation of the procedure. Patient was placed supine on the interventional table. Right groin was prepped and draped in sterile fashion. Maximal barrier sterile technique was utilized including caps, mask, sterile gowns, sterile gloves, sterile drape, hand hygiene and skin antiseptic. Ultrasound confirmed a patent right common femoral artery. Ultrasound image was saved for documentation. Right groin was anesthetized with 1% lidocaine . A small incision was made. Using ultrasound guidance, 21 gauge needle was directed into the right common femoral artery and a micropuncture dilator set was placed. Five French  vascular sheath was placed over a Bentson wire. Soft catheter was used to cannulate the right renal artery. Right renal arteriogram was performed. The lesion in the lateral right kidney lower pole was identified. A 2.8 Progreat microcatheter was advanced into the lower pole segmental artery and arteriography was performed. Microcatheter was advanced into the feeding vessel supplying the  angiomyolipoma and dedicated angiography was performed including a cone beam CT. Bland embolization was performed at this level using 300-500 micron Embospheres. Very small amount of particles needed until there was stagnant flow and reflux in the feeding vessel. Follow-up angiography demonstrates significant pruning of the vascularity following particle embolization. Microcatheter was pulled back and additional angiography was performed. Microcatheter was removed and final angiogram was performed through the Sos catheter. Sos catheter was removed over a wire. Oblique angiography was performed through the right groin sheath. Right groin sheath was removed using an Angio-Seal closure device. Right groin hemostasis at the end of the procedure. Bandage placed over the puncture site. FINDINGS: Right renal arteriography demonstrates a small hypervascular lesion in the lower pole corresponding with the angiomyolipoma on previous CT imaging. Selective angiography and cone beam CT confirmed blood supply to the angiomyolipoma. Following particle embolization of the feeding artery, there was significant pruning of the vessels supplying the angiomyolipoma. Final angiography confirms flow to the main renal arteries and no significant filling of the angiomyolipoma. IMPRESSION: 1. Successful bland embolization of the right renal angiomyolipoma. Electronically Signed   By: Juliene Balder M.D.   On: 03/11/2024 12:29    Labs:  CBC: Recent Labs    03/11/24 0834  WBC 6.3  HGB 11.0*  HCT 35.8*  PLT 227    COAGS: Recent Labs    03/11/24 0834  INR 0.9    BMP: Recent Labs    03/11/24 0834 04/01/24 1012  NA 139 141  K 4.0 3.7  CL 107 107  CO2 19* 21*  GLUCOSE 109* 103*  BUN 36* 30*  CALCIUM 11.0* 10.6*  CREATININE 1.13* 1.21*  GFRNONAA 50* 46*     Assessment and Plan:  79 year old with an asymptomatic angiomyolipoma involving the right kidney lower pole.  The angiolipoma measured up to 4.1 cm on the  most recent CTA.  Patient underwent successful bland embolization of the vessels feeding this small angiomyolipoma.  Patient tolerated the embolization well and reportedly has had no significant change in her renal function.  At this point, recommend imaging surveillance of the angiomyolipoma.  Recommend a 80-month follow-up with either CT or MRI.  In the past, the patient had renal ultrasound surveillance but it was overestimating the actual size of the angiomyolipoma.  Patient is planning to move out of the state in the near future and she will need to get follow-up when she moves.  Otherwise, we will plan to see the patient back in 6 months with follow-up CT abdomen pelvis with IV contrast.   Electronically Signed: Juliene JONELLE Balder 04/08/2024, 1:44 PM   I spent a total of    15 Minutes in face to face in clinical consultation, greater than 50% of which was counseling/coordinating care for right renal AML. Patient ID: AVEA MCGOWEN, female   DOB: [birth year], 79 y.o.   MRN: [id number]  "

## 2024-04-15 NOTE — Addendum Note (Signed)
 Encounter addended by: Crawford Dorothyann POUR on: 04/15/2024 10:16 AM  Actions taken: Imaging Exam ended

## 2024-06-08 ENCOUNTER — Ambulatory Visit: Admitting: Urology
